# Patient Record
Sex: Female | Born: 1995
Health system: Southern US, Community
[De-identification: ages and names within clinical notes are randomized; demographics above are authoritative.]

## PROBLEM LIST (undated history)

## (undated) ENCOUNTER — Ambulatory Visit: Admission: RE | Source: Ambulatory Visit

## (undated) DIAGNOSIS — G43909 Migraine, unspecified, not intractable, without status migrainosus: Secondary | ICD-10-CM

## (undated) DIAGNOSIS — G932 Benign intracranial hypertension: Secondary | ICD-10-CM

## (undated) DIAGNOSIS — F419 Anxiety disorder, unspecified: Secondary | ICD-10-CM

## (undated) DIAGNOSIS — F32A Depression, unspecified: Secondary | ICD-10-CM

## (undated) DIAGNOSIS — N739 Female pelvic inflammatory disease, unspecified: Secondary | ICD-10-CM

## (undated) DIAGNOSIS — T7840XA Allergy, unspecified, initial encounter: Secondary | ICD-10-CM

## (undated) DIAGNOSIS — M549 Dorsalgia, unspecified: Secondary | ICD-10-CM

## (undated) DIAGNOSIS — F329 Major depressive disorder, single episode, unspecified: Secondary | ICD-10-CM

## (undated) DIAGNOSIS — A609 Anogenital herpesviral infection, unspecified: Secondary | ICD-10-CM

## (undated) DIAGNOSIS — Z975 Presence of (intrauterine) contraceptive device: Secondary | ICD-10-CM

## (undated) DIAGNOSIS — K829 Disease of gallbladder, unspecified: Secondary | ICD-10-CM

## (undated) HISTORY — PX: CHOLECYSTECTOMY: SHX55

## (undated) HISTORY — DX: Anogenital herpesviral infection, unspecified: A60.9

## (undated) HISTORY — DX: Benign intracranial hypertension: G93.2

## (undated) HISTORY — DX: Anxiety disorder, unspecified: F41.9

## (undated) HISTORY — DX: Disease of gallbladder, unspecified: K82.9

## (undated) HISTORY — DX: Major depressive disorder, single episode, unspecified: F32.9

## (undated) HISTORY — DX: Dorsalgia, unspecified: M54.9

## (undated) HISTORY — DX: Migraine, unspecified, not intractable, without status migrainosus: G43.909

## (undated) HISTORY — DX: Allergy, unspecified, initial encounter: T78.40XA

## (undated) HISTORY — DX: Depression, unspecified: F32.A

## (undated) HISTORY — DX: Female pelvic inflammatory disease, unspecified: N73.9

## (undated) HISTORY — DX: Presence of (intrauterine) contraceptive device: Z97.5

## (undated) HISTORY — PX: OTHER SURGICAL HISTORY: SHX169

---
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
## 2014-07-18 ENCOUNTER — Telehealth: Payer: Self-pay | Admitting: Emergency Medicine

## 2014-07-18 ENCOUNTER — Telehealth: Payer: Self-pay

## 2014-07-18 ENCOUNTER — Ambulatory Visit (INDEPENDENT_AMBULATORY_CARE_PROVIDER_SITE_OTHER): Payer: BLUE CROSS/BLUE SHIELD | Admitting: Emergency Medicine

## 2014-07-18 VITALS — BP 112/78 | HR 83 | Temp 98.0°F | Resp 16 | Ht 66.0 in | Wt 252.0 lb

## 2014-07-18 DIAGNOSIS — J029 Acute pharyngitis, unspecified: Secondary | ICD-10-CM

## 2014-07-18 DIAGNOSIS — J988 Other specified respiratory disorders: Secondary | ICD-10-CM

## 2014-07-18 LAB — POCT RAPID STREP A (OFFICE): Rapid Strep A Screen: NEGATIVE

## 2014-07-18 MED ORDER — FLUTICASONE PROPIONATE 50 MCG/ACT NA SUSP: 2.0000 | Freq: Every day | NASAL | Status: DC

## 2014-07-18 NOTE — Progress Notes (Signed)
This chart was scribed for Earl LitesSteve Emri Sample, MD by Luisa DagoPriscilla Tutu, ED Scribe. This patient was seen in room 10 and the patient's care was started at 3:24 PM.  Subjective:    Patient ID: Stacy Mills Mills, female    DOB: 09/01/1995, 19 y.o.   MRN: [id number]  Chief Complaint  Patient presents with  . Sore Throat    x 3 weeks  . Nasal Congestion    x 3 days    HPI Stacy Mills is a 19 y.o. female who presents to the office complaining of sudden onset gradually worsening nasal congestion which started approximately 3 weeks ago. She is also complaining of persistent sore throat with onset 3 weeks ago. Pt states that the pain in her throat is like an itchy discomfort. She also admits to a nonproductive cough which is more prominent at night. Denies any post nasal drip, fever, chills, nausea, emesis, abdominal pain, HA, SOB, CP, leg swelling, ear pain, visual disturbances, or rash as associated symptoms.   Patient Active Problem List   Diagnosis Date Noted  . IUD (intrauterine device) in place 01/10/2013  . PID (acute pelvic inflammatory disease) 11/07/2012  . Hemorrhagic cyst of ovary 11/07/2012  . Severe obesity (BMI >= 40) 08/26/2012   Past Medical History  Diagnosis Date  . Anxiety   . Pelvic inflammatory disease 10/2012  . IUD (intrauterine device) in place     Paraguard placed 2014  . Allergy    Past Surgical History  Procedure Laterality Date  . No surgical history    . No past surgeries     No Known Allergies Prior to Admission medications   Medication Sig Start Date End Date Taking? Authorizing Provider  ibuprofen (ADVIL,MOTRIN) 200 MG tablet Take 600 mg by mouth every 6 (six) hours as needed for pain.    Yes Historical Provider, MD   History   Social History  . Marital Status: Single    Spouse Name: n/a  . Number of Children: 0  . Years of Education: N/A   Occupational History  . student     [employer]   Social History Main Topics  . Smoking  status: Never Smoker   . Smokeless tobacco: Never Used  . Alcohol Use: No  . Drug Use: No  . Sexual Activity:    Partners: Male    Birth Control/ Protection: Condom   Other Topics Concern  . Not on file   Social History Narrative   Graduates from [employer] 10/2012.  Will attend [employer] this fall.     Review of Systems  Constitutional: Negative for fever, chills, appetite change and fatigue.  HENT: Positive for congestion, rhinorrhea and sore throat. Negative for ear discharge, ear pain, sinus pressure, trouble swallowing and voice change.   Eyes: Negative for discharge.  Respiratory: Positive for cough. Negative for shortness of breath, wheezing and stridor.   Cardiovascular: Negative for chest pain.  Gastrointestinal: Negative for nausea, vomiting, abdominal pain and diarrhea.  Genitourinary: Negative.  Negative for frequency and hematuria.  Musculoskeletal: Negative for back pain.  Skin: Negative for rash.  Neurological: Negative for seizures and headaches.   Objective:   Physical Exam  Constitutional: She appears well-developed and well-nourished. No distress.  HENT:  Head: Normocephalic and atraumatic.  Right Ear: External ear normal.  Left Ear: External ear normal.  Mouth/Throat: Oropharynx is clear and moist. No oropharyngeal exudate.  Significant nasal congestion  Eyes: Conjunctivae are normal. Right eye exhibits no discharge.  Left eye exhibits no discharge.  Neck: Neck supple.  Cardiovascular: Normal rate, regular rhythm and normal heart sounds.  Exam reveals no gallop and no friction rub.   No murmur heard. Pulmonary/Chest: Effort normal and breath sounds normal. No respiratory distress.  Abdominal: Soft. She exhibits no distension. There is no tenderness.  Musculoskeletal: She exhibits no edema or tenderness.  Neurological: She is alert.  Skin: Skin is warm and dry.  Psychiatric: She has a normal mood and affect. Her behavior is normal. Thought content  normal.  Nursing note and vitals reviewed.    Filed Vitals:   07/18/14 1507  BP: 112/78  Pulse: 83  Temp: 98 F (36.7 C)  Resp: 16  Height:  (1.676 m)  Weight: 252 lb (114.306 kg)  SpO2: 97%   Results for orders placed or performed in visit on 07/18/14  POCT rapid strep A  Result Value Ref Range   Rapid Strep A Screen Negative Negative    Assessment & Plan:   advised her to take Zyrtec one a day and use the Flonase spray.I personally performed the services described in this documentation, which was scribed in my presence. The recorded information has been reviewed and is accurate.

## 2014-07-18 NOTE — Telephone Encounter (Signed)
I called and spoke with patient. She states she has had some fatigue and questions regarding dehydration. I encouraged her to drink water and juice. I told her we did not do a fatigue evaluation in the office today. I told her we would be happy to do that at any time and check a blood count, thyroid,and blood sugar.

## 2014-07-18 NOTE — Telephone Encounter (Signed)
Dry mouth, not urinating, weak, and feeling tired. Called pt, I let her know the signs of dehydration and advised her to go to the hospital if she feels she is getting worse. Advised to push more fluids. Pt understood.

## 2014-07-18 NOTE — Patient Instructions (Signed)

## 2014-07-18 NOTE — Telephone Encounter (Signed)
Patient seen today and wants to know the signs of dehydration. I asked her if she was feeling worse or if she had any additional symptoms since her visit earlier today and she said no. Cb# [phone]

## 2014-07-21 ENCOUNTER — Other Ambulatory Visit: Payer: Self-pay | Admitting: Emergency Medicine

## 2014-07-21 LAB — CULTURE, GROUP A STREP

## 2014-07-21 MED ORDER — AMOXICILLIN 875 MG PO TABS: 875.0000 mg | ORAL_TABLET | Freq: Two times a day (BID) | ORAL | Status: DC

## 2014-07-22 ENCOUNTER — Telehealth: Payer: Self-pay

## 2014-07-22 NOTE — Telephone Encounter (Signed)
Left message for pt to call back  °

## 2014-07-22 NOTE — Telephone Encounter (Signed)
Patient was prescribed amoxicillin her last office visit and could possible be having an allergic reaction. Patient would like someone to give her a call. [phone]

## 2014-07-22 NOTE — Telephone Encounter (Signed)
Pt CB and reported that she is feeling nauseous after taking the amox. Pt reports she has had no rash/itching, no swelling of lips, face throat or SOB. Pt has not vomited, just felt like she might. Asked pt if she took amox w/food and she did not, she took it both times before eating. I advised her to try taking it after she has eaten a meal (bland, non-spicy food) and to call back tomorrow if it still causes nausea. Pt agreed.

## 2014-10-31 ENCOUNTER — Telehealth: Payer: Self-pay | Admitting: *Deleted

## 2014-10-31 NOTE — Telephone Encounter (Signed)
Pt called with questions because she is late for her period and has a Paragard. She has an apt Mon 6/6. I left message for pt. KW CMA

## 2014-11-03 ENCOUNTER — Ambulatory Visit (INDEPENDENT_AMBULATORY_CARE_PROVIDER_SITE_OTHER): Payer: BLUE CROSS/BLUE SHIELD | Admitting: Gynecology

## 2014-11-03 ENCOUNTER — Encounter: Payer: Self-pay | Admitting: Gynecology

## 2014-11-03 VITALS — BP 120/80 | Ht 65.0 in | Wt 260.0 lb

## 2014-11-03 DIAGNOSIS — N912 Amenorrhea, unspecified: Secondary | ICD-10-CM | POA: Diagnosis not present

## 2014-11-03 DIAGNOSIS — Z8619 Personal history of other infectious and parasitic diseases: Secondary | ICD-10-CM

## 2014-11-03 DIAGNOSIS — Z01419 Encounter for gynecological examination (general) (routine) without abnormal findings: Secondary | ICD-10-CM

## 2014-11-03 LAB — CBC WITH DIFFERENTIAL/PLATELET
Basophils Absolute: 0 10*3/uL (ref 0.0–0.1)
Basophils Relative: 0 % (ref 0–1)
Eosinophils Absolute: 0.2 10*3/uL (ref 0.0–0.7)
Eosinophils Relative: 2 % (ref 0–5)
HCT: 38.1 % (ref 36.0–46.0)
Hemoglobin: 12.5 g/dL (ref 12.0–15.0)
Lymphocytes Relative: 37 % (ref 12–46)
Lymphs Abs: 3.6 10*3/uL (ref 0.7–4.0)
MCH: 27.5 pg (ref 26.0–34.0)
MCHC: 32.8 g/dL (ref 30.0–36.0)
MCV: 83.9 fL (ref 78.0–100.0)
MPV: 9.8 fL (ref 8.6–12.4)
Monocytes Absolute: 0.8 10*3/uL (ref 0.1–1.0)
Monocytes Relative: 8 % (ref 3–12)
NEUTROS PCT: 53 % (ref 43–77)
Neutro Abs: 5.1 10*3/uL (ref 1.7–7.7)
PLATELETS: 317 10*3/uL (ref 150–400)
RBC: 4.54 MIL/uL (ref 3.87–5.11)
RDW: 13.3 % (ref 11.5–15.5)
WBC: 9.7 10*3/uL (ref 4.0–10.5)

## 2014-11-03 LAB — PREGNANCY, URINE: PREG TEST UR: NEGATIVE

## 2014-11-03 MED ORDER — MEDROXYPROGESTERONE ACETATE 10 MG PO TABS: 10.0000 mg | ORAL_TABLET | Freq: Every day | ORAL | Status: DC

## 2014-11-03 NOTE — Progress Notes (Signed)
Stacy Mills 12/15/1995 [id number]   History:    19 y.o.  for annual gyn exam who stated that she has not had a menstrual cycle and 40 days. Patient had a ParaGard T380A IUD placed in 2014. Patient stated in the past she has been treated for Chlamydia infection/PID. Patient with no change in sexual partners. She denied any nausea vomiting but some slight breast tenderness. Patient has completed her HPV vaccine several years ago.  Past medical history,surgical history, family history and social history were all reviewed and documented in the EPIC chart.  Gynecologic History Patient's last menstrual period was 08/27/2014. Contraception: IUD Last Pap: No previous study. Results were: No previous study Last mammogram: Not indicated. Results were: Not indicated  Obstetric History OB History  Gravida Para Term Preterm AB SAB TAB Ectopic Multiple Living  0                  ROS: A ROS was performed and pertinent positives and negatives are included in the history.  GENERAL: No fevers or chills. HEENT: No change in vision, no earache, sore throat or sinus congestion. NECK: No pain or stiffness. CARDIOVASCULAR: No chest pain or pressure. No palpitations. PULMONARY: No shortness of breath, cough or wheeze. GASTROINTESTINAL: No abdominal pain, nausea, vomiting or diarrhea, melena or bright red blood per rectum. GENITOURINARY: No urinary frequency, urgency, hesitancy or dysuria. MUSCULOSKELETAL: No joint or muscle pain, no back pain, no recent trauma. DERMATOLOGIC: No rash, no itching, no lesions. ENDOCRINE: No polyuria, polydipsia, no heat or cold intolerance. No recent change in weight. HEMATOLOGICAL: No anemia or easy bruising or bleeding. NEUROLOGIC: No headache, seizures, numbness, tingling or weakness. PSYCHIATRIC: No depression, no loss of interest in normal activity or change in sleep pattern.     Exam: chaperone present  BP 120/80 mmHg  Ht 5\' 5"  (1.651 m)  Wt 260 lb (117.935 kg)  BMI  43.27 kg/m2  LMP 08/27/2014  Body mass index is 43.27 kg/(m^2).  General appearance : Well developed well nourished female. No acute distress HEENT: Eyes: no retinal hemorrhage or exudates,  Neck supple, trachea midline, no carotid bruits, no thyroidmegaly Lungs: Clear to auscultation, no rhonchi or wheezes, or rib retractions  Heart: Regular rate and rhythm, no murmurs or gallops Breast:Examined in sitting and supine position were symmetrical in appearance, no palpable masses or tenderness,  no skin retraction, no nipple inversion, no nipple discharge, no skin discoloration, no axillary or supraclavicular lymphadenopathy Abdomen: no palpable masses or tenderness, no rebound or guarding Extremities: no edema or skin discoloration or tenderness  Pelvic:  Bartholin, Urethra, Skene Glands: Within normal limits             Vagina: No gross lesions or discharge  Cervix: No gross lesions or discharge, IUD string seen  Uterus  anteverted, normal size, shape and consistency, non-tender and mobile  Adnexa  Without masses or tenderness  Anus and perineum  normal   Rectovaginal  normal sphincter tone without palpated masses or tenderness             Hemoccult not indicated   Urine pregnancy test negative  Assessment/Plan:  19 y.o. female for annual exam who is overweight with a BMI of 43.27 kg meter square this may be a contributing factor for her secondary amenorrhea. Patient denied any visual disturbances or any nipple discharge or any unusual headache. We are going to check a prolactin level along with a TSH because of her secondary menorrhea. Also as  part of her screening blood work CBC and urinalysis will be obtained today. A GC and Chlamydia culture was obtained. Pap smear is not indicated until next year. Patient will be prescribed Provera 10 mg take 1 by mouth daily for 10 days to jump start her menses. We discussed importance of good nutrition and regular exercise.   Ok Edwards MD,  3:25 PM 11/03/2014

## 2014-11-04 LAB — URINALYSIS W MICROSCOPIC + REFLEX CULTURE
BILIRUBIN URINE: NEGATIVE
CASTS: NONE SEEN
CRYSTALS: NONE SEEN
GLUCOSE, UA: NEGATIVE mg/dL
HGB URINE DIPSTICK: NEGATIVE
KETONES UR: NEGATIVE mg/dL
LEUKOCYTES UA: NEGATIVE
NITRITE: NEGATIVE
Protein, ur: NEGATIVE mg/dL
SPECIFIC GRAVITY, URINE: 1.02 (ref 1.005–1.030)
UROBILINOGEN UA: 0.2 mg/dL (ref 0.0–1.0)
pH: 7.5 (ref 5.0–8.0)

## 2014-11-04 LAB — TSH: TSH: 0.747 u[IU]/mL (ref 0.350–4.500)

## 2014-11-04 LAB — PROLACTIN: PROLACTIN: 10.2 ng/mL

## 2014-11-04 LAB — GC/CHLAMYDIA PROBE AMP
CT PROBE, AMP APTIMA: NEGATIVE
GC Probe RNA: NEGATIVE

## 2014-11-05 LAB — URINE CULTURE

## 2014-12-22 ENCOUNTER — Ambulatory Visit (INDEPENDENT_AMBULATORY_CARE_PROVIDER_SITE_OTHER): Payer: BLUE CROSS/BLUE SHIELD | Admitting: Physician Assistant

## 2014-12-22 VITALS — BP 116/70 | HR 74 | Temp 99.4°F | Resp 18 | Ht 65.0 in | Wt 259.0 lb

## 2014-12-22 DIAGNOSIS — G43909 Migraine, unspecified, not intractable, without status migrainosus: Secondary | ICD-10-CM | POA: Insufficient documentation

## 2014-12-22 DIAGNOSIS — G43009 Migraine without aura, not intractable, without status migrainosus: Secondary | ICD-10-CM | POA: Diagnosis not present

## 2014-12-22 MED ORDER — SUMATRIPTAN SUCCINATE 50 MG PO TABS: 50.0000 mg | ORAL_TABLET | ORAL | Status: DC | PRN

## 2014-12-22 NOTE — Patient Instructions (Signed)
Please take the imitrex at the start of a headache.  You can repeat in 2 hours if the headache persists. Do not take more than 4 doses in a 24 hour period. If your headaches persist and the imitrex doesn't work to relieve them, let us know and I will plan to refer you to neurology.   Migraine Headache A migraine headache is an intense, throbbing pain on one or both sides of your head. A migraine can last for 30 minutes to several hours. CAUSES  The exact cause of a migraine headache is not always known. However, a migraine may be caused when nerves in the brain become irritated and release chemicals that cause inflammation. This causes pain. Certain things may also trigger migraines, such as:  Alcohol.  Smoking.  Stress.  Menstruation.  Aged cheeses.  Foods or drinks that contain nitrates, glutamate, aspartame, or tyramine.  Lack of sleep.  Chocolate.  Caffeine.  Hunger.  Physical exertion.  Fatigue.  Medicines used to treat chest pain (nitroglycerine), birth control pills, estrogen, and some blood pressure medicines. SIGNS AND SYMPTOMS  Pain on one or both sides of your head.  Pulsating or throbbing pain.  Severe pain that prevents daily activities.  Pain that is aggravated by any physical activity.  Nausea, vomiting, or both.  Dizziness.  Pain with exposure to bright lights, loud noises, or activity.  General sensitivity to bright lights, loud noises, or smells. Before you get a migraine, you may get warning signs that a migraine is coming (aura). An aura may include:  Seeing flashing lights.  Seeing bright spots, halos, or zigzag lines.  Having tunnel vision or blurred vision.  Having feelings of numbness or tingling.  Having trouble talking.  Having muscle weakness. DIAGNOSIS  A migraine headache is often diagnosed based on:  Symptoms.  Physical exam.  A CT scan or MRI of your head. These imaging tests cannot diagnose migraines, but they can  help rule out other causes of headaches. TREATMENT Medicines may be given for pain and nausea. Medicines can also be given to help prevent recurrent migraines.  HOME CARE INSTRUCTIONS  Only take over-the-counter or prescription medicines for pain or discomfort as directed by your health care provider. The use of long-term narcotics is not recommended.  Lie down in a dark, quiet room when you have a migraine.  Keep a journal to find out what may trigger your migraine headaches. For example, write down:  What you eat and drink.  How much sleep you get.  Any change to your diet or medicines.  Limit alcohol consumption.  Quit smoking if you smoke.  Get 7-9 hours of sleep, or as recommended by your health care provider.  Limit stress.  Keep lights dim if bright lights bother you and make your migraines worse. SEEK IMMEDIATE MEDICAL CARE IF:   Your migraine becomes severe.  You have a fever.  You have a stiff neck.  You have vision loss.  You have muscular weakness or loss of muscle control.  You start losing your balance or have trouble walking.  You feel faint or pass out.  You have severe symptoms that are different from your first symptoms. MAKE SURE YOU:   Understand these instructions.  Will watch your condition.  Will get help right away if you are not doing well or get worse. Document Released: 05/16/2005 Document Revised: 09/30/2013 Document Reviewed: 01/21/2013 [facility] Patient Information 2015 [place], [place]. This information is not intended to replace advice given to  you by your health care provider. Make sure you discuss any questions you have with your health care provider.  

## 2014-12-22 NOTE — Progress Notes (Signed)
   Subjective:    Patient ID: Stacy Mills, female    DOB: Jan 08, 1996, 19 y.o.   MRN: [id number]  Chief Complaint  Patient presents with  . Headache    off and on for years but have gotten worse    Patient Active Problem List   Diagnosis Date Noted  . Migraines 12/22/2014  . IUD (intrauterine device) in place 01/10/2013  . PID (acute pelvic inflammatory disease) 11/07/2012  . Hemorrhagic cyst of ovary 11/07/2012  . Severe obesity (BMI >= 40) 08/26/2012   Medications, allergies, past medical history, surgical history, family history, social history and problem list reviewed and updated.  HPI  71 yof presents with recurrent HAs she believes are migraines.   Not having bad HA today. Intermittent past 1-2 yrs. No triggers or aggravating factors she can think of. Gets one approx every 1-2 wks. Located across top of head. Rated 8/10. Typically last 1/2 day. Relief with lying down and dark room. Tylenol and goodys no longer help.  Assoc phonophobia. No photophobia or aura. Not assoc with menses. No assoc fevers, chills, neck stiffness. Assoc nausea but no emesis.   Sleeps well at night. Wears contacts. No assoc vision changes. Seeing her optometrist next month.   Review of Systems See HPI.     Objective:   Physical Exam  Constitutional: She is oriented to person, place, and time. She appears well-developed and well-nourished.  Non-toxic appearance. She does not have a sickly appearance. She does not appear ill. No distress.  BP 116/70 mmHg  Pulse 74  Temp(Src) 99.4 F (37.4 C) (Oral)  Resp 18  Ht  (1.651 m)  Wt 259 lb (117.482 kg)  BMI 43.10 kg/m2  SpO2 99%  LMP 12/19/2014   Eyes: Conjunctivae and EOM are normal. Pupils are equal, round, and reactive to light.  Neurological: She is alert and oriented to person, place, and time. No cranial nerve deficit.      Assessment & Plan:   Migraine without aura and without status migrainosus, not intractable - Plan: SUMAtriptan  (IMITREX) 50 MG tablet --suspect migraines are cause of recurrent HAs -- imitrex for abortive measures --if HAs worsen or imitrex does not resolve will refer to neurology for further w/u as these are not classic migranies  Donnajean Lopes, PA-C Physician Assistant-Certified [facility]  12/22/2014 3:43 PM

## 2015-11-04 ENCOUNTER — Ambulatory Visit (INDEPENDENT_AMBULATORY_CARE_PROVIDER_SITE_OTHER): Payer: BLUE CROSS/BLUE SHIELD | Admitting: Gynecology

## 2015-11-04 ENCOUNTER — Encounter: Payer: Self-pay | Admitting: Gynecology

## 2015-11-04 VITALS — BP 128/78 | Ht 65.0 in | Wt 260.0 lb

## 2015-11-04 DIAGNOSIS — Z3009 Encounter for other general counseling and advice on contraception: Secondary | ICD-10-CM

## 2015-11-04 DIAGNOSIS — Z124 Encounter for screening for malignant neoplasm of cervix: Secondary | ICD-10-CM

## 2015-11-04 DIAGNOSIS — Z30432 Encounter for removal of intrauterine contraceptive device: Secondary | ICD-10-CM | POA: Diagnosis not present

## 2015-11-04 MED ORDER — ETONOGESTREL-ETHINYL ESTRADIOL 0.12-0.015 MG/24HR VA RING: VAGINAL_RING | VAGINAL | Status: DC

## 2015-11-04 NOTE — Patient Instructions (Signed)

## 2015-11-04 NOTE — Progress Notes (Signed)
   HPI: Patient is a 20 year old was seen in the office in June of last year for her annual exam but was only 20 years of age and had not had a Pap smear. She had a ParaGard T380A IUD placed in 2014. Patient is here to have her IUD removed. She is reporting normal menstrual cycles. She also wanted discuss alternative forms of contraception.   ROS: A ROS was performed and pertinent positives and negatives are included in the history.  GENERAL: No fevers or chills. HEENT: No change in vision, no earache, sore throat or sinus congestion. NECK: No pain or stiffness. CARDIOVASCULAR: No chest pain or pressure. No palpitations. PULMONARY: No shortness of breath, cough or wheeze. GASTROINTESTINAL: No abdominal pain, nausea, vomiting or diarrhea, melena or bright red blood per rectum. GENITOURINARY: No urinary frequency, urgency, hesitancy or dysuria. MUSCULOSKELETAL: No joint or muscle pain, no back pain, no recent trauma. DERMATOLOGIC: No rash, no itching, no lesions. ENDOCRINE: No polyuria, polydipsia, no heat or cold intolerance. No recent change in weight. HEMATOLOGICAL: No anemia or easy bruising or bleeding. NEUROLOGIC: No headache, seizures, numbness, tingling or weakness. PSYCHIATRIC: No depression, no loss of interest in normal activity or change in sleep pattern.   PE: Blood pressure 128/78       weight 260 pounds     BMI 43.27 kg/m Well-developed motors female in no acute distress Abdomen: Soft nontender no rebound or guarding Pelvic: Bartholin urethra Skene was within normal limits Vagina: No lesions or discharge Cervix: IUD string visualized The use of a [place] clamp the IUD string was grasped retrieved shown to the patient discarded Uterus anteverted normal size shape and consistency Adnexa no palpable mass or tenderness Rectal exam: Not done     Assessment Plan: 20 year old requesting removal of her ParaGard T380A IUD. We discussed different alternative forms of contraception to include  oral contraceptive pill, Nexplanon, Depo-Provera injection, NuvaRing. She would like to try the NuvaRing. A sample was provided. Instructions were provided as well as literature information. Of note her Pap smear was also done today.    Greater than 50% of time was spent in counseling and coordinating care of this patient.   Time of consultation: 15   Minutes.

## 2015-11-06 LAB — PAP IG W/ RFLX HPV ASCU

## 2015-11-20 ENCOUNTER — Encounter (HOSPITAL_COMMUNITY): Payer: Self-pay | Admitting: Emergency Medicine

## 2015-11-20 ENCOUNTER — Emergency Department (HOSPITAL_COMMUNITY)
Admission: EM | Admit: 2015-11-20 | Discharge: 2015-11-20 | Disposition: A | Discharge status: Home / Self Care | Payer: BLUE CROSS/BLUE SHIELD | Attending: Emergency Medicine | Admitting: Emergency Medicine

## 2015-11-20 DIAGNOSIS — N6459 Other signs and symptoms in breast: Secondary | ICD-10-CM | POA: Insufficient documentation

## 2015-11-20 NOTE — ED Provider Notes (Signed)
CSN: [id number]     Arrival date & time 11/20/15  1744 History  By signing my name below, I, Placido SouLogan Joldersma, attest that this documentation has been prepared under the direction and in the presence of Everlene FarrierWilliam Telvin Reinders, PA-C. Electronically Signed: Placido SouLogan Joldersma, ED Scribe. 11/20/2015. 7:33 PM.   No chief complaint on file.  The history is provided by the patient. No language interpreter was used.    HPI Comments: [name] OkBrandi Kelsay is a 20 y.o. female who presents to the Emergency Department complaining of a foreign body in her right nipple x 1 month. Pt states that her bilateral nipples were pierced 1 month ago. Beginning 1 week ago she noticed that the barb on the end of the piercing became embedded in her right nipple while she was asleep and is now unable to remove her piercing. She reports associated, mild, yellow discharge from the site but denies any nipple pain. Pt denies fevers, chills, tenderness, redness, swelling or any other associated symptoms at this time.     Past Medical History  Diagnosis Date  . Anxiety   . Pelvic inflammatory disease 10/2012  . IUD (intrauterine device) in place     Paraguard placed 2014  . Allergy    Past Surgical History  Procedure Laterality Date  . No surgical history    . No past surgeries     Family History  Problem Relation Age of Onset  . Emphysema Maternal Grandfather   . Depression Maternal Grandfather   . Anxiety disorder Maternal Grandfather   . Cancer Paternal Grandmother    Social History  Substance Use Topics  . Smoking status: Never Smoker   . Smokeless tobacco: Never Used  . Alcohol Use: No   OB History    Gravida Para Term Preterm AB TAB SAB Ectopic Multiple Living   0    0   0  0     Review of Systems  Constitutional: Negative for fever and chills.  Musculoskeletal: Negative for myalgias.  Skin: Positive for wound. Negative for color change and rash.    Allergies  Review of patient's allergies indicates no known  allergies.  Home Medications   Prior to Admission medications   Medication Sig Start Date End Date Taking? Authorizing Provider  etonogestrel-ethinyl estradiol (NUVARING) 0.12-0.015 MG/24HR vaginal ring Insert vaginally and leave in place for 3 consecutive weeks, then remove for 1 week. 11/04/15   Ok EdwardsJuan H Fernandez, MD  SUMAtriptan (IMITREX) 50 MG tablet Take 1 tablet (50 mg total) by mouth every 2 (two) hours as needed for migraine (Do not exceed 4 doses in one day.). May repeat in 2 hours if headache persists or recurs. 12/22/14   Todd McVeigh, PA   BP 123/95 mmHg  Pulse 87  Temp(Src) 98 F (36.7 C) (Oral)  Resp 16  SpO2 100%  LMP 10/26/2015    Physical Exam  Constitutional: She appears well-developed and well-nourished. No distress.  Nontoxic appearing.  HENT:  Head: Normocephalic and atraumatic.  Eyes: Right eye exhibits no discharge. Left eye exhibits no discharge.  Neck: Neck supple.  Cardiovascular: Normal rate, regular rhythm, normal heart sounds and intact distal pulses.   Pulmonary/Chest: Effort normal. No respiratory distress.  The lateral aspect of the patients nipple piercing is beneath the surface of her nipple. There is no lateral piercing hole. The medial aspect of the piercing is exposed and mobile. Nipple is nontender to palpation. No evidence of nipple piercing hole to lateral aspect. No discharge from her nipple.  No overlying skin changes. No cellulitis.  Genitourinary:  Chaperone present  Lymphadenopathy:    She has no cervical adenopathy.  Neurological: She is alert. Coordination normal.  Skin: Skin is warm and dry. No rash noted. She is not diaphoretic. No erythema. No pallor.  Psychiatric: She has a normal mood and affect. Her behavior is normal.  Nursing note and vitals reviewed.   ED Course  Procedures  DIAGNOSTIC STUDIES: Oxygen Saturation is 100% on RA, normal by my interpretation.    COORDINATION OF CARE: 7:25 PM Discussed next steps with pt. Pt  verbalized understanding and is agreeable with the plan.   7:31 PM Discussed pt with the attending physician who recommends pt be referred to general surgery for removal of nipple piercing at a later date. Pt advised and is agreeable with the plan.   Labs Review Labs Reviewed - No data to display  Imaging Review No results found.   EKG Interpretation None      Filed Vitals:   11/20/15 1814  BP: 123/95  Pulse: 87  Temp: 98 F (36.7 C)  TempSrc: Oral  Resp: 16  SpO2: 100%     MDM   Meds given in ED:  Medications - No data to display  New Prescriptions   No medications on file    Final diagnoses:  Nipple problem   This is a 20 y.o. female who presents to the Emergency Department complaining of a foreign body in her right nipple x 1 month. Pt states that her bilateral nipples were pierced 1 month ago. Beginning 1 week ago she noticed that the barb on the end of the piercing became embedded in her right nipple while she was asleep and is now unable to remove her piercing. She reports associated, mild, yellow discharge from the site but denies any nipple pain.  On exam the patient is afebrile nontoxic appearing. The lateral aspect of her nipple piercing is absent and beneath the service of her skin. Medial aspect is exposed. No lateral aspect nipple piercing hole is present. No discharge from the nipple piercing. No overlying skin changes. No cellulitis. No evidence of infection. Unable to remove piercing with gentle pressure. After discussion with my attending, Dr. Effie ShyWentz, will send the patient to general surgery for surgical removal of her nipple piercing. I discussed her transpacific return precautions. I discussed signs and symptoms of nipple infection, and I encouraged her to return to the emergency department with any signs of infection. I advised the patient to follow-up with their primary care provider this week. I advised the patient to return to the emergency department with  new or worsening symptoms or new concerns. The patient verbalized understanding and agreement with plan.    This patient was discussed with Dr. Effie ShyWentz agrees with assessment and plan.  I personally performed the services described in this documentation, which was scribed in my presence. The recorded information has been reviewed and is accurate.      Everlene FarrierWilliam Aizlyn Schifano, PA-C 11/20/15 1942  Mancel BaleElliott Wentz, MD 11/21/15 [phone]

## 2015-11-20 NOTE — ED Notes (Signed)
1 month prior had her nipples pierced. Pt states while the nipple was healing, the ring was still intact while the nipple scarring grew around/over top of it. The patient is no longer able to see or access the nipple ring to remove it.

## 2015-11-20 NOTE — Discharge Instructions (Signed)
Please call and make an appointment for follow-up with general surgery for removal of your nipple piercing. Please return to the emergency department with any signs or symptoms of infection or new or worsening symptoms or new concerns.  Breast Self-Awareness Practicing breast self-awareness may pick up problems early, prevent significant medical complications, and possibly save your life. By practicing breast self-awareness, you can become familiar with how your breasts look and feel and if your breasts are changing. This allows you to notice changes early. It can also offer you some reassurance that your breast health is good. One way to learn what is normal for your breasts and whether your breasts are changing is to do a breast self-exam. If you find a lump or something that was not present in the past, it is best to contact your caregiver right away. Other findings that should be evaluated by your caregiver include nipple discharge, especially if it is bloody; skin changes or reddening; areas where the skin seems to be pulled in (retracted); or new lumps and bumps. Breast pain is seldom associated with cancer (malignancy), but should also be evaluated by a caregiver. HOW TO PERFORM A BREAST SELF-EXAM The best time to examine your breasts is 5-7 days after your menstrual period is over. During menstruation, the breasts are lumpier, and it may be more difficult to pick up changes. If you do not menstruate, have reached menopause, or had your uterus removed (hysterectomy), you should examine your breasts at regular intervals, such as monthly. If you are breastfeeding, examine your breasts after a feeding or after using a breast pump. Breast implants do not decrease the risk for lumps or tumors, so continue to perform breast self-exams as recommended. Talk to your caregiver about how to determine the difference between the implant and breast tissue. Also, talk about the amount of pressure you should use during  the exam. Over time, you will become more familiar with the variations of your breasts and more comfortable with the exam. A breast self-exam requires you to remove all your clothes above the waist. 1. Look at your breasts and nipples. Stand in front of a mirror in a room with good lighting. With your hands on your hips, push your hands firmly downward. Look for a difference in shape, contour, and size from one breast to the other (asymmetry). Asymmetry includes puckers, dips, or bumps. Also, look for skin changes, such as reddened or scaly areas on the breasts. Look for nipple changes, such as discharge, dimpling, repositioning, or redness. 2. Carefully feel your breasts. This is best done either in the shower or tub while using soapy water or when flat on your back. Place the arm (on the side of the breast you are examining) above your head. Use the pads (not the fingertips) of your three middle fingers on your opposite hand to feel your breasts. Start in the underarm area and use  inch (2 cm) overlapping circles to feel your breast. Use 3 different levels of pressure (light, medium, and firm pressure) at each circle before moving to the next circle. The light pressure is needed to feel the tissue closest to the skin. The medium pressure will help to feel breast tissue a little deeper, while the firm pressure is needed to feel the tissue close to the ribs. Continue the overlapping circles, moving downward over the breast until you feel your ribs below your breast. Then, move one finger-width towards the center of the body. Continue to use the  inch (2 cm) overlapping circles to feel your breast as you move slowly up toward the collar bone (clavicle) near the base of the neck. Continue the up and down exam using all 3 pressures until you reach the middle of the chest. Do this with each breast, carefully feeling for lumps or changes. 3.  Keep a written record with breast changes or normal findings for each  breast. By writing this information down, you do not need to depend only on memory for size, tenderness, or location. Write down where you are in your menstrual cycle, if you are still menstruating. Breast tissue can have some lumps or thick tissue. However, see your caregiver if you find anything that concerns you.  SEEK MEDICAL CARE IF:  You see a change in shape, contour, or size of your breasts or nipples.   You see skin changes, such as reddened or scaly areas on the breasts or nipples.   You have an unusual discharge from your nipples.   You feel a new lump or unusually thick areas.    This information is not intended to replace advice given to you by your health care provider. Make sure you discuss any questions you have with your health care provider.   Document Released: 05/16/2005 Document Revised: 05/02/2012 Document Reviewed: 08/31/2011 Elsevier Interactive Patient Education [employer] Inc.

## 2016-04-04 ENCOUNTER — Encounter (HOSPITAL_COMMUNITY): Payer: Self-pay | Admitting: Emergency Medicine

## 2016-04-04 ENCOUNTER — Emergency Department (HOSPITAL_COMMUNITY)
Admission: EM | Admit: 2016-04-04 | Discharge: 2016-04-04 | Disposition: A | Discharge status: Home / Self Care | Payer: BLUE CROSS/BLUE SHIELD | Attending: Dermatology | Admitting: Dermatology

## 2016-04-04 DIAGNOSIS — I10 Essential (primary) hypertension: Secondary | ICD-10-CM | POA: Insufficient documentation

## 2016-04-04 DIAGNOSIS — F17210 Nicotine dependence, cigarettes, uncomplicated: Secondary | ICD-10-CM | POA: Insufficient documentation

## 2016-04-04 DIAGNOSIS — Z5321 Procedure and treatment not carried out due to patient leaving prior to being seen by health care provider: Secondary | ICD-10-CM | POA: Insufficient documentation

## 2016-04-04 NOTE — ED Notes (Signed)
Pt called from triage again, no answer

## 2016-04-04 NOTE — ED Triage Notes (Signed)
Pt reports High BP last night 143/98. Sts has family Hx HTN. Would like to have it checked. Reports headache and sts seeing stars left eye. Denies dizziness.

## 2016-04-05 ENCOUNTER — Ambulatory Visit (HOSPITAL_COMMUNITY)
Admission: EM | Admit: 2016-04-05 | Discharge: 2016-04-05 | Disposition: A | Discharge status: Home / Self Care | Payer: BLUE CROSS/BLUE SHIELD | Attending: Family Medicine | Admitting: Family Medicine

## 2016-04-05 ENCOUNTER — Encounter (HOSPITAL_COMMUNITY): Payer: Self-pay | Admitting: Emergency Medicine

## 2016-04-05 DIAGNOSIS — H43392 Other vitreous opacities, left eye: Secondary | ICD-10-CM | POA: Diagnosis not present

## 2016-04-05 NOTE — ED Triage Notes (Signed)
[facility] with a complaint of seeing a "dark Spot" with her left eye that started 2 days ago. The patient reported a family hx HTN. [facility] last night but did not wait to be seen by a provider.

## 2016-04-05 NOTE — Discharge Instructions (Signed)
See eye doctor for further check.

## 2016-04-05 NOTE — ED Provider Notes (Signed)
[facility]    CSN: [id number] Arrival date & time: 04/05/16  1757     History   Chief Complaint Chief Complaint  Patient presents with  . Eye Problem    HPI Stacy Mills is a 20 y.o. female.   The history is provided by the patient and a relative.  Eye Problem  Location:  Left eye Quality: floater sensation in visual field, no pain, no visual loss. Severity:  Mild Onset quality:  Sudden Duration:  2 days Progression:  Unchanged Chronicity:  New Context: not direct trauma, not foreign body and not scratch   Associated symptoms: no blurred vision, no crusting, no decreased vision, no discharge, no double vision, no headaches, no itching, no photophobia and no redness     Past Medical History:  Diagnosis Date  . Allergy   . Anxiety   . IUD (intrauterine device) in place    Paraguard placed 2014  . Pelvic inflammatory disease 10/2012    Patient Active Problem List   Diagnosis Date Noted  . Migraines 12/22/2014  . IUD (intrauterine device) in place 01/10/2013  . PID (acute pelvic inflammatory disease) 11/07/2012  . Hemorrhagic cyst of ovary 11/07/2012  . Severe obesity (BMI >= 40) (HCC) 08/26/2012    Past Surgical History:  Procedure Laterality Date  . NO PAST SURGERIES    . no surgical history      OB History    Gravida Para Term Preterm AB Living   0       0 0   SAB TAB Ectopic Multiple Live Births       0           Home Medications    Prior to Admission medications   Medication Sig Start Date End Date Taking? Authorizing Provider  etonogestrel-ethinyl estradiol (NUVARING) 0.12-0.015 MG/24HR vaginal ring Insert vaginally and leave in place for 3 consecutive weeks, then remove for 1 week. Patient not taking: Reported on 04/05/2016 11/04/15   Ok EdwardsJuan H Fernandez, MD  SUMAtriptan (IMITREX) 50 MG tablet Take 1 tablet (50 mg total) by mouth every 2 (two) hours as needed for migraine (Do not exceed 4 doses in one day.). May repeat in 2 hours if  headache persists or recurs. Patient not taking: Reported on 04/05/2016 12/22/14   Raelyn Ensignodd McVeigh, PA    Family History Family History  Problem Relation Age of Onset  . Emphysema Maternal Grandfather   . Depression Maternal Grandfather   . Anxiety disorder Maternal Grandfather   . Cancer Paternal Grandmother     Social History Social History  Substance Use Topics  . Smoking status: Never Smoker  . Smokeless tobacco: Never Used  . Alcohol use No     Allergies   Patient has no known allergies.   Review of Systems Review of Systems  Constitutional: Negative.   HENT: Negative.   Eyes: Negative for blurred vision, double vision, photophobia, pain, discharge, redness, itching and visual disturbance.  Respiratory: Negative.   Neurological: Negative for headaches.  All other systems reviewed and are negative.    Physical Exam Triage Vital Signs ED Triage Vitals  Enc Vitals Group     BP --      Pulse Rate 04/05/16 1818 83     Resp 04/05/16 1818 18     Temp 04/05/16 1818 98.1 F (36.7 C)     Temp Source 04/05/16 1818 Oral     SpO2 04/05/16 1818 100 %     Weight --  Height --      Head Circumference --      Peak Flow --      Pain Score 04/05/16 1823 6     Pain Loc --      Pain Edu? --      Excl. in GC? --    No data found.   Updated Vital Signs Pulse 83   Temp 98.1 F (36.7 C) (Oral)   Resp 18   LMP 03/04/2016   SpO2 100%   Visual Acuity Right Eye Distance: 20/20 Left Eye Distance: Unable to See Eye Chart Bilateral Distance: 20/25  Right Eye Near:   Left Eye Near:    Bilateral Near:     Physical Exam  Constitutional: She appears well-developed and well-nourished.  Eyes: Conjunctivae and EOM are normal. Pupils are equal, round, and reactive to light. Right eye exhibits no discharge. Left eye exhibits no discharge.  Neck: Normal range of motion. Neck supple.  Lymphadenopathy:    She has no cervical adenopathy.  Skin: Skin is warm and dry.    Nursing note and vitals reviewed.    UC Treatments / Results  Labs (all labs ordered are listed, but only abnormal results are displayed) Labs Reviewed - No data to display  EKG  EKG Interpretation None       Radiology No results found.  Procedures Procedures (including critical care time)  Medications Ordered in UC Medications - No data to display   Initial Impression / Assessment and Plan / UC Course  I have reviewed the triage vital signs and the nursing notes.  Pertinent labs & imaging results that were available during my care of the patient were reviewed by me and considered in my medical decision making (see chart for details).  Clinical Course       Final Clinical Impressions(s) / UC Diagnoses   Final diagnoses:  None    New Prescriptions New Prescriptions   No medications on file     Linna HoffJames D Jacob Cicero, MD 04/05/16 1914

## 2016-04-07 ENCOUNTER — Emergency Department (HOSPITAL_COMMUNITY)
Admission: EM | Admit: 2016-04-07 | Discharge: 2016-04-07 | Disposition: A | Discharge status: Home / Self Care | Payer: BLUE CROSS/BLUE SHIELD | Attending: Emergency Medicine | Admitting: Emergency Medicine

## 2016-04-07 ENCOUNTER — Encounter (HOSPITAL_COMMUNITY): Payer: Self-pay | Admitting: Emergency Medicine

## 2016-04-07 ENCOUNTER — Emergency Department (HOSPITAL_COMMUNITY): Payer: BLUE CROSS/BLUE SHIELD

## 2016-04-07 DIAGNOSIS — F17210 Nicotine dependence, cigarettes, uncomplicated: Secondary | ICD-10-CM | POA: Insufficient documentation

## 2016-04-07 DIAGNOSIS — H53132 Sudden visual loss, left eye: Secondary | ICD-10-CM

## 2016-04-07 DIAGNOSIS — R51 Headache: Secondary | ICD-10-CM | POA: Diagnosis present

## 2016-04-07 DIAGNOSIS — H5462 Unqualified visual loss, left eye, normal vision right eye: Secondary | ICD-10-CM | POA: Insufficient documentation

## 2016-04-07 DIAGNOSIS — G932 Benign intracranial hypertension: Secondary | ICD-10-CM | POA: Diagnosis not present

## 2016-04-07 LAB — COMPREHENSIVE METABOLIC PANEL
ALBUMIN: 3.7 g/dL (ref 3.5–5.0)
ALT: 24 U/L (ref 14–54)
ANION GAP: 9 (ref 5–15)
AST: 24 U/L (ref 15–41)
Alkaline Phosphatase: 48 U/L (ref 38–126)
BUN: 10 mg/dL (ref 6–20)
CHLORIDE: 107 mmol/L (ref 101–111)
CO2: 22 mmol/L (ref 22–32)
Calcium: 9.4 mg/dL (ref 8.9–10.3)
Creatinine, Ser: 0.87 mg/dL (ref 0.44–1.00)
GFR calc Af Amer: >60 mL/min (ref >60)
GFR calc non Af Amer: >60 mL/min (ref >60)
GLUCOSE: 81 mg/dL (ref 65–99)
POTASSIUM: 3.9 mmol/L (ref 3.5–5.1)
SODIUM: 138 mmol/L (ref 135–145)
TOTAL PROTEIN: 7.2 g/dL (ref 6.5–8.1)
Total Bilirubin: 0.4 mg/dL (ref 0.3–1.2)

## 2016-04-07 LAB — CBC WITH DIFFERENTIAL/PLATELET
BASOS ABS: 0 10*3/uL (ref 0.0–0.1)
BASOS PCT: 0 %
EOS ABS: 0.2 10*3/uL (ref 0.0–0.7)
Eosinophils Relative: 2 %
HCT: 39.4 % (ref 36.0–46.0)
Hemoglobin: 12.7 g/dL (ref 12.0–15.0)
Lymphocytes Relative: 47 %
Lymphs Abs: 3.8 10*3/uL (ref 0.7–4.0)
MCH: 27.4 pg (ref 26.0–34.0)
MCHC: 32.2 g/dL (ref 30.0–36.0)
MCV: 84.9 fL (ref 78.0–100.0)
MONO ABS: 0.6 10*3/uL (ref 0.1–1.0)
MONOS PCT: 7 %
NEUTROS ABS: 3.6 10*3/uL (ref 1.7–7.7)
Neutrophils Relative %: 44 %
PLATELETS: 292 10*3/uL (ref 150–400)
RBC: 4.64 MIL/uL (ref 3.87–5.11)
RDW: 12.8 % (ref 11.5–15.5)
WBC: 8.1 10*3/uL (ref 4.0–10.5)

## 2016-04-07 LAB — CSF CELL COUNT WITH DIFFERENTIAL
RBC COUNT CSF: 0 /mm3
RBC COUNT CSF: 1 /mm3 — AB
TUBE #: 4
Tube #: 1
WBC CSF: 5 /mm3 (ref 0–5)
WBC CSF: 7 /mm3 — AB (ref 0–5)

## 2016-04-07 LAB — GLUCOSE, CSF: GLUCOSE CSF: 42 mg/dL (ref 40–70)

## 2016-04-07 LAB — PROTEIN, CSF: TOTAL PROTEIN, CSF: 28 mg/dL (ref 15–45)

## 2016-04-07 LAB — SEDIMENTATION RATE: Sed Rate: 30 mm/hr — ABNORMAL HIGH (ref 0–22)

## 2016-04-07 MED ORDER — DIPHENHYDRAMINE HCL 50 MG/ML IJ SOLN
50.0000 mg | Freq: Once | INTRAMUSCULAR | Status: AC
  Administered 2016-04-07: 50 mg via INTRAMUSCULAR

## 2016-04-07 MED ORDER — ACETAZOLAMIDE ER 500 MG PO CP12: 500.0000 mg | ORAL_CAPSULE | Freq: Two times a day (BID) | ORAL | 0 refills | Status: DC

## 2016-04-07 MED ORDER — GADOBENATE DIMEGLUMINE 529 MG/ML IV SOLN
20.0000 mL | Freq: Once | INTRAVENOUS | Status: AC
  Administered 2016-04-07: 20 mL via INTRAVENOUS

## 2016-04-07 MED ORDER — DIPHENHYDRAMINE HCL 50 MG/ML IJ SOLN
INTRAMUSCULAR | Status: AC
  Filled 2016-04-07: qty 1

## 2016-04-07 MED ORDER — ACETAZOLAMIDE ER 500 MG PO CP12
500.0000 mg | ORAL_CAPSULE | Freq: Once | ORAL | Status: AC
  Administered 2016-04-07: 500 mg via ORAL
  Filled 2016-04-07: qty 1

## 2016-04-07 MED ORDER — ACETAZOLAMIDE 250 MG PO TABS: 250.0000 mg | ORAL_TABLET | Freq: Two times a day (BID) | ORAL | 0 refills | Status: DC | PRN

## 2016-04-07 NOTE — Consult Note (Signed)
Neurology Consultation Reason for Consult: visual changes Referring Physician: ED provider  CC: visual changes  History is obtained from:patient, chart.  HPI: Stacy Mills is a 20 y.o. female healthy female with hx of migraine and anxiety presents from the opthalmology office.   Developed sudden onset dark spot of her vision in the left eye 4 days ago only when she closes her right eye. If both eyes are open, she does not see that spot. It's a fixed spot centrally with preserved vision surrounding the spot. Has occasional flashing of lights and several floaters. Went to urgent care 2 days ago for this and was seen in opthamology office by Dr. Kathlen Mody today. Had dilated eye exam there. She has normal intraocular pressure on both eyes. She was found to have papilledema left greater than right. She was then sent to the ER with recommendation for neuro consult, MRI, and LP for further evaluation.   No similar symptom in the past. No numbness, weakness, tingling, hearing deficit, significant headache (only some headache when she tries to focus her vision hard). No family hx of neuro diseases. No fever, no recent travel or sick contacts.     Review of Systems  Constitutional: Negative for chills, fever, malaise/fatigue and weight loss.  HENT: Negative for congestion, ear pain, hearing loss and tinnitus.   Eyes: Negative for photophobia, pain, discharge and redness.       Dark spot  Cardiovascular: Negative for chest pain, palpitations, orthopnea and leg swelling.  Gastrointestinal: Negative for heartburn, nausea and vomiting.  Skin: Negative for rash.  Neurological: Negative for dizziness, tingling, sensory change, speech change, focal weakness, seizures, loss of consciousness, weakness and headaches.     Past Medical History:  Diagnosis Date  . Allergy   . Anxiety   . IUD (intrauterine device) in place    Paraguard placed 2014  . Pelvic inflammatory disease 10/2012    Family History   Problem Relation Age of Onset  . Emphysema Maternal Grandfather   . Depression Maternal Grandfather   . Anxiety disorder Maternal Grandfather   . Cancer Paternal Grandmother     Social History:  reports that she has never smoked. She has never used smokeless tobacco. She reports that she does not drink alcohol or use drugs.   Exam: Current vital signs: Ht '5\' 6"'$  (1.676 m)   Wt 270 lb (122.5 kg)   LMP 03/03/2016 (Exact Date)   BMI 43.58 kg/m  Vital signs in last 24 hours: Weight:  [270 lb (122.5 kg)] 270 lb (122.5 kg) (11/09 1046)   Physical Exam  Constitutional: Appears well-developed and well-nourished.  Psych: Affect appropriate to situation Eyes: No scleral injection HENT: No OP obstrucion Head: Normocephalic.  Cardiovascular: Normal rate and regular rhythm.  Respiratory: Effort normal and breath sounds normal to anterior ascultation GI: Soft.  No distension. There is no tenderness.  Skin: WDI  Neuro: Mental Status: Patient is awake, alert, oriented to person, place, month, year, and situation. Patient is able to give a clear and coherent history. No signs of aphasia or neglect Cranial Nerves: II: Visual Fields are full. Pupils are dilated, round, and reactive to light.  III,IV, VI: EOMI without ptosis or diploplia. Has central field vision loss on left eye.  V: Facial sensation decreased on the left side.  VII: Facial movement is symmetric.  VIII: hearing is intact to voice X: Uvula elevates symmetrically XI: Shoulder shrug is symmetric. XII: tongue is midline without atrophy or fasciculations.  Motor: Tone is  normal. Bulk is normal. 5/5 strength was present in all four extremities.  Sensory: Sensation is slightly decreased on left leg.  Deep Tendon Reflexes: 2+ and symmetric in the biceps and patellae.  Plantars: Toes are downgoing bilaterally.  Cerebellar: FNF and HKS are intact bilaterally      I have reviewed labs in epic and the results pertinent  to this consultation are: Bmet, cbc, esr 30.   I have reviewed the images obtained:  MRI brain and orbits were normal.  Impression:   20 yo female here with 4 days of left sided central vision loss that's not progressive, with papilledema L>R and normal MRI. DDX pseudotumor cerebri vs optic neuritis.  Recommendations: 1) will get LP with opening pressure under fluoroscopy and evaluate further.    Dellia Nims, M.D., PGY-3

## 2016-04-07 NOTE — ED Triage Notes (Signed)
Pt from opthalmologists office with c/o seeing a dark spot in her vision of left eye starting 4 days ago with sudden onset.  Reports they told her she had swelling in her eye and sent her here for an MRI.  Pt in NAD, A&O.

## 2016-04-07 NOTE — ED Notes (Signed)
Papers reviewed with patient and family. They verbalize understanding and intent to follow up

## 2016-04-07 NOTE — ED Provider Notes (Signed)
[facility]   CSN: [id number] Arrival date & time: 04/07/16  [id number]     History   Chief Complaint Chief Complaint  Patient presents with  . Eye Problem    HPI Stacy Mills is a 20 y.o. female.  HPI   20 year old obese female with history of migraine and anxiety from ophthalmologist office for further evaluation of visual changes.  Patient developed sudden onset dark spot of vision in the left eye approximate 4 days ago. She described as a fixed blurry spot centrally in her L eye.  sts she can see around the spot peripherally but cannot see through the spot.  Report occasional flashes of lights, as well as several floaters that look like long curly strings.  C/o headache only when she has to focus her vision.  She wears contacts.  Denies fever, diplopia, URI sxs, neck pain, cp, sob, focal numbness or weakness.  Denies lightheadedness or dizziness.  NO recent trauma.  Pt initially was seen at urgent care 2 days ago for this complaint and was referred to ophthalmologist. She saw the ophthalmologist, Dr. Alben SpittleWeaver today. She has a high dilated for examination. She has normal intraocular pressure on both eyes. She was found to have papilledema left greater than right. She was then sent to the ER with recommendation for neuro consult, MRI, and LP for further evaluation.   Past Medical History:  Diagnosis Date  . Allergy   . Anxiety   . IUD (intrauterine device) in place    Paraguard placed 2014  . Pelvic inflammatory disease 10/2012    Patient Active Problem List   Diagnosis Date Noted  . Migraines 12/22/2014  . IUD (intrauterine device) in place 01/10/2013  . PID (acute pelvic inflammatory disease) 11/07/2012  . Hemorrhagic cyst of ovary 11/07/2012  . Severe obesity (BMI >= 40) (HCC) 08/26/2012    Past Surgical History:  Procedure Laterality Date  . NO PAST SURGERIES    . no surgical history      OB History    Gravida Para Term Preterm AB Living   0        0 0   SAB TAB Ectopic Multiple Live Births       0           Home Medications    Prior to Admission medications   Medication Sig Start Date End Date Taking? Authorizing Provider  etonogestrel-ethinyl estradiol (NUVARING) 0.12-0.015 MG/24HR vaginal ring Insert vaginally and leave in place for 3 consecutive weeks, then remove for 1 week. Patient not taking: Reported on 04/05/2016 11/04/15   Ok EdwardsJuan H Fernandez, MD  SUMAtriptan (IMITREX) 50 MG tablet Take 1 tablet (50 mg total) by mouth every 2 (two) hours as needed for migraine (Do not exceed 4 doses in one day.). May repeat in 2 hours if headache persists or recurs. Patient not taking: Reported on 04/05/2016 12/22/14   Raelyn Ensignodd McVeigh, PA    Family History Family History  Problem Relation Age of Onset  . Emphysema Maternal Grandfather   . Depression Maternal Grandfather   . Anxiety disorder Maternal Grandfather   . Cancer Paternal Grandmother     Social History Social History  Substance Use Topics  . Smoking status: Never Smoker  . Smokeless tobacco: Never Used  . Alcohol use No     Allergies   Patient has no known allergies.   Review of Systems Review of Systems  All other systems reviewed and are negative.    Physical Exam  Updated Vital Signs LMP 03/03/2016 (Exact Date)   Physical Exam  Constitutional: She is oriented to person, place, and time. She appears well-developed and well-nourished. No distress.  Obese female, sitting in the bed in no acute discomfort  HENT:  Head: Atraumatic.  Right Ear: External ear normal.  Left Ear: External ear normal.  Mouth/Throat: Oropharynx is clear and moist.  Eyes: Conjunctivae and EOM are normal. Right eye exhibits no discharge. Left eye exhibits no discharge.  Pupils are dilated  Visual Acuity R Distance: 20/400 L Distance: couldnt see anything    Neck: Normal range of motion. Neck supple.  Cardiovascular: Normal rate and regular rhythm.   Pulmonary/Chest: Effort normal  and breath sounds normal.  Neurological: She is alert and oriented to person, place, and time. She has normal strength. No cranial nerve deficit or sensory deficit. She displays a negative Romberg sign. Coordination and gait normal. GCS eye subscore is 4. GCS verbal subscore is 5. GCS motor subscore is 6.  Skin: No rash noted.  Psychiatric: She has a normal mood and affect.  Nursing note and vitals reviewed.    ED Treatments / Results  Labs (all labs ordered are listed, but only abnormal results are displayed) Labs Reviewed  SEDIMENTATION RATE - Abnormal; Notable for the following:       Result Value   Sed Rate 30 (*)    All other components within normal limits  CSF CULTURE  CBC WITH DIFFERENTIAL/PLATELET  COMPREHENSIVE METABOLIC PANEL  GLUCOSE, CSF  PROTEIN, CSF  HIV ANTIBODY (ROUTINE TESTING)  CSF CELL COUNT WITH DIFFERENTIAL  CSF CELL COUNT WITH DIFFERENTIAL    EKG  EKG Interpretation None       Radiology Mr Laqueta Jean And Wo Contrast  Result Date: 04/07/2016 CLINICAL DATA:  Left visual disturbance beginning 4 days ago, sudden onset. Abnormal funduscopic exam. Patient felt slightly itchy and developed a mild cough after contrast administration. No hives could be visualized. She was given 50 mg of Benadryl intravenously as a precaution before the examination was finished. I would interpret this as a mild allergy to MultiHance and consider premedication with Benadryl in the future. EXAM: MRI HEAD AND ORBITS WITHOUT AND WITH CONTRAST TECHNIQUE: Multiplanar, multiecho pulse sequences of the brain and surrounding structures were obtained without and with intravenous contrast. Multiplanar, multiecho pulse sequences of the orbits and surrounding structures were obtained including fat saturation techniques, before and after intravenous contrast administration. CONTRAST:  20mL MULTIHANCE GADOBENATE DIMEGLUMINE 529 MG/ML IV SOLN COMPARISON:  None. FINDINGS: MRI HEAD FINDINGS Brain: The  brain has normal appearance without evidence of malformation, atrophy, old or acute small or large vessel infarction, hemorrhage, hydrocephalus or extra-axial collection. No pituitary abnormality. After contrast administration, no abnormal enhancement occurs. Vascular: Major vessels at the base of the brain show flow. Skull and upper cervical spine: Normal Sinuses/Orbits: Clear/ normal. Other: None significant. MRI ORBITS FINDINGS Orbits: Both globes appear normal. Both optic nerves and optic nerve sheaths are normal. Intraorbital fat is normal. Extra-ocular muscles are normal. Lacrimal glands are normal. No abnormal enhancement. Vascular structures are normal. Visualized sinuses: Clear Soft tissues: No other finding Limited intracranial: Negative IMPRESSION: Normal MRI of the brain and orbits. No finding to explain the clinical presentation. No sign of increased intracranial pressure by imaging. No focal brain or orbital finding. As discussed above, the patient should be considered to have a mild allergy to MultiHance. I would suggest premedication with Benadryl should she need MultiHance gadolinium contrast in the future. Electronically  Signed   By: Paulina FusiMark  Shogry M.D.   On: 04/07/2016 15:08   Mr Rockwell GermanyOrbits W MVWo Contrast  Result Date: 04/07/2016 CLINICAL DATA:  Left visual disturbance beginning 4 days ago, sudden onset. Abnormal funduscopic exam. Patient felt slightly itchy and developed a mild cough after contrast administration. No hives could be visualized. She was given 50 mg of Benadryl intravenously as a precaution before the examination was finished. I would interpret this as a mild allergy to MultiHance and consider premedication with Benadryl in the future. EXAM: MRI HEAD AND ORBITS WITHOUT AND WITH CONTRAST TECHNIQUE: Multiplanar, multiecho pulse sequences of the brain and surrounding structures were obtained without and with intravenous contrast. Multiplanar, multiecho pulse sequences of the orbits and  surrounding structures were obtained including fat saturation techniques, before and after intravenous contrast administration. CONTRAST:  20mL MULTIHANCE GADOBENATE DIMEGLUMINE 529 MG/ML IV SOLN COMPARISON:  None. FINDINGS: MRI HEAD FINDINGS Brain: The brain has normal appearance without evidence of malformation, atrophy, old or acute small or large vessel infarction, hemorrhage, hydrocephalus or extra-axial collection. No pituitary abnormality. After contrast administration, no abnormal enhancement occurs. Vascular: Major vessels at the base of the brain show flow. Skull and upper cervical spine: Normal Sinuses/Orbits: Clear/ normal. Other: None significant. MRI ORBITS FINDINGS Orbits: Both globes appear normal. Both optic nerves and optic nerve sheaths are normal. Intraorbital fat is normal. Extra-ocular muscles are normal. Lacrimal glands are normal. No abnormal enhancement. Vascular structures are normal. Visualized sinuses: Clear Soft tissues: No other finding Limited intracranial: Negative IMPRESSION: Normal MRI of the brain and orbits. No finding to explain the clinical presentation. No sign of increased intracranial pressure by imaging. No focal brain or orbital finding. As discussed above, the patient should be considered to have a mild allergy to MultiHance. I would suggest premedication with Benadryl should she need MultiHance gadolinium contrast in the future. Electronically Signed   By: Paulina FusiMark  Shogry M.D.   On: 04/07/2016 15:08   Dg Lumbar Puncture Fluoro Guide  Result Date: 04/07/2016 CLINICAL DATA:  Acute visual change EXAM: DIAGNOSTIC LUMBAR PUNCTURE UNDER FLUOROSCOPIC GUIDANCE FLUOROSCOPY TIME:  Fluoroscopy Time:  0 minutes 12 seconds Radiation Exposure Index (if provided by the fluoroscopic device): Number of Acquired Spot Images: 0 PROCEDURE: Informed consent was obtained from the patient prior to the procedure, including potential complications of headache, allergy, and pain. With the patient  prone, the lower back was prepped with Betadine. 1% Lidocaine was used for local anesthesia. Lumbar puncture was performed at the L3-4 level using a 18 gauge needle with return of clear CSF with an opening pressure of 38 cm water. Eleven ml of CSF were obtained for laboratory studies. The patient tolerated the procedure well and there were no apparent complications. IMPRESSION: Successful lumbar puncture using fluoroscopy. Elevated opening pressure 38 cm H2O Electronically Signed   By: Marlan Palauharles  Clark M.D.   On: 04/07/2016 17:20    Procedures Procedures (including critical care time)  Medications Ordered in ED Medications  diphenhydrAMINE (BENADRYL) 50 MG/ML injection (not administered)  diphenhydrAMINE (BENADRYL) injection 50 mg (50 mg Intramuscular Given 04/07/16 1401)  gadobenate dimeglumine (MULTIHANCE) injection 20 mL (20 mLs Intravenous Contrast Given 04/07/16 1450)  acetaZOLAMIDE (DIAMOX) 12 hr capsule 500 mg (500 mg Oral Given 04/07/16 1801)     Initial Impression / Assessment and Plan / ED Course  I have reviewed the triage vital signs and the nursing notes.  Pertinent labs & imaging results that were available during my care of the patient were reviewed  by me and considered in my medical decision making (see chart for details).  Clinical Course     BP 133/97 (BP Location: Right Arm)   Pulse 70   Resp 17   Ht 5\' 6"  (1.676 m)   Wt 122.5 kg   LMP 04/07/2016   SpO2 100%   BMI 43.58 kg/m    Final Clinical Impressions(s) / ED Diagnoses   Final diagnoses:  Acute visual loss, left  Idiopathic intracranial hypertension    New Prescriptions New Prescriptions   ACETAZOLAMIDE (DIAMOX) 500 MG CAPSULE    Take 1 capsule (500 mg total) by mouth 2 (two) times daily.   11:26 AM Patient here with acute visual changes in the left eye, new papilledema to both eyes left greater than right, sent here from ophthalmologist for further evaluation. Ophthalmology request neuro consult, MRI,  and LP. Appreciate consultation from neurologist, Dr. Amada Jupiter who recommend brain MRI with and without contrast, orbital MRI with and without contrast. He will also see patient in the ER.  Care discussed with Dr. Donnald Garre.   3:34 PM Brain and orbit MRI showing no acute finding, normal optic nerve.  Dr. Amada Jupiter has evaluated pt, he recommend consulting IR for LP to check opening pressure. If above 25 then will treat for psueudotumor cerebri with acetazolamide 500mg  BID.  If pressure >25, then she will need to be admitted for optic neuritis with IV steroid 1g/day.  Dr. Amada Jupiter also consulted with ophthalmologist Dr. Alben Spittle and request for close outpt f/u as well.    Doubt temporal arteritis due to pt's age.     5:01 PM CSF pressure is 38, likely reflect pseudotumor cerebri.  11cm O2 of clear CSF fluid were removed.  I have sent CSF cell count with diff, glucose, culture.  Pt given acetazolamide 500mg  tab while in the ER.  Pt will be prescribe this medication for the month.  Pt has a scheduled appointment with PCP on Dec 11th.  I also encourage pt to f/u with ophthalmology and neurology in the mean time for further care  Return precaution discussed.    7:02 PM Dr. Amada Jupiter recommend increasing dose to 750mg  BID if pt able to tolerates.  Unfortunately pt has left before I can give her the new recommendation.  I hope she can get her medication adjust during her upcoming neuro visit outpt.   Fayrene Helper, PA-C 04/07/16 1833    Fayrene Helper, PA-C 04/07/16 1903    Arby Barrette, MD 04/12/16 [phone]

## 2016-04-07 NOTE — ED Notes (Signed)
Patient transported to MRI 

## 2016-04-07 NOTE — Discharge Instructions (Signed)
Please take medication as prescribed as treatment for your symptoms.  Follow up closely with your eye specialist and neurologist next week.  Establish care with a primary care provider as soon as you can.

## 2016-04-08 ENCOUNTER — Encounter: Payer: Self-pay | Admitting: Diagnostic Neuroimaging

## 2016-04-08 ENCOUNTER — Ambulatory Visit (INDEPENDENT_AMBULATORY_CARE_PROVIDER_SITE_OTHER): Payer: BLUE CROSS/BLUE SHIELD | Admitting: Diagnostic Neuroimaging

## 2016-04-08 VITALS — BP 115/84 | HR 77 | Ht 66.0 in | Wt 263.0 lb

## 2016-04-08 DIAGNOSIS — G932 Benign intracranial hypertension: Secondary | ICD-10-CM

## 2016-04-08 DIAGNOSIS — H53132 Sudden visual loss, left eye: Secondary | ICD-10-CM | POA: Diagnosis not present

## 2016-04-08 LAB — HIV ANTIBODY (ROUTINE TESTING W REFLEX): HIV Screen 4th Generation wRfx: NONREACTIVE

## 2016-04-08 MED ORDER — ACETAZOLAMIDE ER 500 MG PO CP12: 500.0000 mg | ORAL_CAPSULE | Freq: Two times a day (BID) | ORAL | 4 refills | Status: DC

## 2016-04-08 NOTE — Progress Notes (Signed)
[facility]  PATIENT: Stacy Mills DOB: 01/20/1996  REFERRING CLINICIAN: Willeen Niece, MD HISTORY FROM: patient and mother; reviewed ER notes REASON FOR VISIT: new consult    HISTORICAL  CHIEF COMPLAINT:  Chief Complaint  Patient presents with  . Rm 7    Mom, Judeth Cornfield  . New Patient (Initial Visit)    F/u ED visit yesterday for idiopathic intracranial hypertension. Started new rx for Diamox 500 mg BID.  Marland Kitchen Visual Field Change    Pt w/ h/o migraines and anxiety c/o dark spot in left eye as well as floaters and flashes of light. Vision: 20/200 on right w/o contacts, UTA left.    HISTORY OF PRESENT ILLNESS:   20 year old right-handed female here for evaluation of idiopathic intracranial hypertension. 04/04/16 patient noticed a dark spot in her left eye. On 04/05/16 patient went to ophthalmologist and was referred to the emergency room for evaluation. However she left before being seen. Patient returned to emergency room on 04/07/16 for evaluation after seeing ophthalmologist. Patient was diagnosed with bilateral papilledema, had MRI of the brain and orbits, lumbar puncture, and was diagnosed with idiopathic intracranial hypertension (pseudotumor cerebri). Patient was discharged on acetazolamide 500 mg twice a day. She took first dose last night and one doses morning. So far patient is stable.  Patient does report history of headaches since age 69 years old. Headaches have been worse in last 1 year. She is frontal severe headaches with photophobia, phonophobia and throbbing sensation. No nausea or vomiting.   REVIEW OF SYSTEMS: Full 14 system review of systems performed and negative with exception of: 100 pound weight gain over 2 years (150 lbs --> 263lbs)  ALLERGIES: Allergies  Allergen Reactions  . Gadolinium Derivatives Itching, Nausea Only and Cough    HOME MEDICATIONS: Outpatient Medications Prior to Visit  Medication Sig Dispense Refill  . acetaZOLAMIDE  (DIAMOX) 500 MG capsule Take 1 capsule (500 mg total) by mouth 2 (two) times daily. 70 capsule 0  . acetaZOLAMIDE (DIAMOX) 250 MG tablet Take 1 tablet (250 mg total) by mouth 2 (two) times daily as needed. 30 tablet 0  . etonogestrel-ethinyl estradiol (NUVARING) 0.12-0.015 MG/24HR vaginal ring Insert vaginally and leave in place for 3 consecutive weeks, then remove for 1 week. (Patient not taking: Reported on 04/07/2016) 1 each 12  . SUMAtriptan (IMITREX) 50 MG tablet Take 1 tablet (50 mg total) by mouth every 2 (two) hours as needed for migraine (Do not exceed 4 doses in one day.). May repeat in 2 hours if headache persists or recurs. (Patient not taking: Reported on 04/07/2016) 10 tablet 1   No facility-administered medications prior to visit.     PAST MEDICAL HISTORY: Past Medical History:  Diagnosis Date  . Allergy   . Anxiety   . Depression   . IUD (intrauterine device) in place    Paraguard placed 2014  . Migraine   . Pelvic inflammatory disease 10/2012    PAST SURGICAL HISTORY: Past Surgical History:  Procedure Laterality Date  . NO PAST SURGERIES      FAMILY HISTORY: Family History  Problem Relation Age of Onset  . Emphysema Maternal Grandfather   . Depression Maternal Grandfather   . Anxiety disorder Maternal Grandfather   . Cancer Paternal Grandmother   . Non-Hodgkin's lymphoma Paternal Grandmother   . Asthma Mother   . Hypertension Father     SOCIAL HISTORY:  Social History   Social History  . Marital status: Single    Spouse name:  n/a  . Number of children: 0  . Years of education: 12+   Occupational History  . College student    Social History Main Topics  . Smoking status: Current Some Day Smoker    Types: Cigarettes  . Smokeless tobacco: Never Used  . Alcohol use 0.0 oz/week     Comment: Monthly  . Drug use:     Types: Other-see comments     Comment: Quit date 02/13/15  . Sexual activity: Yes    Partners: Male    Birth control/ protection:  Condom, IUD   Other Topics Concern  . Not on file   Social History Narrative   Graduated from high school 10/2012, currently attending college.   Right-handed   Caffeine: 3x/wk     PHYSICAL EXAM  GENERAL EXAM/CONSTITUTIONAL: Vitals:  Vitals:   04/08/16 1026  BP: 115/84  Pulse: 77  Weight: 263 lb (119.3 kg)  Height: 5\' 6"  (1.676 m)     Body mass index is 42.45 kg/m.  Visual Acuity Screening   Right eye Left eye Both eyes  Without correction: 20/200 UTA   With correction:        Patient is in no distress; well developed, nourished and groomed; neck is supple  CARDIOVASCULAR:  Examination of carotid arteries is normal; no carotid bruits  Regular rate and rhythm, no murmurs  Examination of peripheral vascular system by observation and palpation is normal  EYES:  Ophthalmoscopic exam of optic discs and posterior segments is normal; EXCEPT BLURRED DISC MARGINS  MUSCULOSKELETAL:  Gait, strength, tone, movements noted in Neurologic exam below  NEUROLOGIC: MENTAL STATUS:  No flowsheet data found.  awake, alert, oriented to person, place and time  recent and remote memory intact  normal attention and concentration  language fluent, comprehension intact, naming intact,   fund of knowledge appropriate  CRANIAL NERVE:   2nd - BILATERAL PAPILLEDEMA  2nd, 3rd, 4th, 6th - pupils equal and reactive to light, visual fields full to confrontation, extraocular muscles intact, no nystagmus  5th - facial sensation symmetric  7th - facial strength symmetric  8th - hearing intact  9th - palate elevates symmetrically, uvula midline  11th - shoulder shrug symmetric  12th - tongue protrusion midline  MOTOR:   normal bulk and tone, full strength in the BUE, BLE  SENSORY:   normal and symmetric to light touch, temperature, vibration  COORDINATION:   finger-nose-finger, fine finger movements normal  REFLEXES:   deep tendon reflexes TRACE and  symmetric  GAIT/STATION:   narrow based gait; able to walk tandem    DIAGNOSTIC DATA (LABS, IMAGING, TESTING) - I reviewed patient records, labs, notes, testing and imaging myself where available.  Lab Results  Component Value Date   WBC 8.1 04/07/2016   HGB 12.7 04/07/2016   HCT 39.4 04/07/2016   MCV 84.9 04/07/2016   PLT 292 04/07/2016      Component Value Date/Time   NA 138 04/07/2016 1100   K 3.9 04/07/2016 1100   CL 107 04/07/2016 1100   CO2 22 04/07/2016 1100   GLUCOSE 81 04/07/2016 1100   BUN 10 04/07/2016 1100   CREATININE 0.87 04/07/2016 1100   CREATININE 0.93 11/05/2012 1015   CALCIUM 9.4 04/07/2016 1100   PROT 7.2 04/07/2016 1100   ALBUMIN 3.7 04/07/2016 1100   AST 24 04/07/2016 1100   ALT 24 04/07/2016 1100   ALKPHOS 48 04/07/2016 1100   BILITOT 0.4 04/07/2016 1100   GFRNONAA >60 04/07/2016 1100  GFRAA >60 04/07/2016 1100   No results found for: CHOL, HDL, LDLCALC, LDLDIRECT, TRIG, CHOLHDL No results found for: AOZH0QHGBA1C No results found for: VITAMINB12 Lab Results  Component Value Date   TSH 0.747 11/03/2014    04/07/16 MRI brain / orbits [I reviewed images myself and agree with interpretation. -VRP]  - Normal MRI of the brain and orbits. No finding to explain the clinical presentation. No sign of increased intracranial pressure by imaging. No focal brain or orbital finding.  - As discussed above, the patient should be considered to have a mild allergy to MultiHance. I would suggest premedication with Benadryl should she need MultiHance gadolinium contrast in the future.   04/07/16 lumbar puncture - Successful lumbar puncture using fluoroscopy. Elevated opening pressure 38 cm H2O     ASSESSMENT AND PLAN  [age] y.o. year old female here with significant weight gain in the last 2 years, with bilateral papilledema and headaches. Also with enlargement of left eye blind spot. Findings, signs and symptoms consistent with idiopathic intracranial  hypertension.   Dx:  1. IIH (idiopathic intracranial hypertension)   2. Severe obesity (BMI >= 40) (HCC)   3. Sudden visual loss of left eye     PLAN: - continue acetazolamide 500mg  twice a day - follow up with ophthalmology for serial eye exams and visual field testing to monitor effectiveness of acetazolamide - reviewed nutrition, fitness and stress mgmt advice with goal of gradual, sustained weight loss - follow up with PCP (new pt appt in May 09, 2016)  Meds ordered this encounter  Medications  . acetaZOLAMIDE (DIAMOX) 500 MG capsule    Sig: Take 1 capsule (500 mg total) by mouth 2 (two) times daily.    Dispense:  180 capsule    Refill:  4   Return in about 3 months (around 07/09/2016).  I reviewed images, labs, notes, records myself. I summarized findings and reviewed with patient, for this high risk condition (vision loss; increased intracranial pressure) requiring high complexity decision making.    Suanne MarkerVIKRAM R. Bishoy Cupp, MD 04/08/2016, 10:42 AM Certified in Neurology, Neurophysiology and Neuroimaging  [facility] [address], [address] [place], [place] [address] [phone]

## 2016-04-08 NOTE — Patient Instructions (Signed)
Thank you for coming to see [place] at [facility]. I hope we have been able to provide you high quality care today.  You may receive a patient satisfaction survey over the next few weeks. We would appreciate your feedback and comments so that we may continue to improve ourselves and the health of our patients.  - continue acetazolamide 557m twice a day   ~~~~~~~~~~~~~~~~~~~~~~~~~~~~~~~~~~~~~~~~~~~~~~~~~~~~~~~~~~~~~~~~~  DR. PENUMALLI'S GUIDE TO HAPPY AND HEALTHY LIVING These are some of my general health and wellness recommendations. Some of them may apply to you better than others. Please use common sense as you try these suggestions and feel free to ask me any questions.   ACTIVITY/FITNESS Mental, social, emotional and physical stimulation are very important for brain and body health. Try learning a new activity (arts, music, language, sports, games).  Keep moving your body to the best of your abilities. You can do this at home, inside or outside, the park, community center, gym or anywhere you like. Consider a physical therapist or personal trainer to get started. Consider the app Sworkit. Fitness trackers such as smart-watches, smart-phones or Fitbits can help as well.   NUTRITION Eat more plants: colorful vegetables, nuts, seeds and berries.  Eat less sugar, salt, preservatives and processed foods.  Avoid toxins such as cigarettes and alcohol.  Drink water when you are thirsty. Warm water with a slice of lemon is an excellent morning drink to start the day.  Consider these websites for more information The Nutrition Source (hhttps://www.henry-hernandez.biz/ [employer] Nutrition (wWindowBlog.ch   RELAXATION Consider practicing mindfulness meditation or other relaxation techniques such as deep breathing, prayer, yoga, tai chi, massage. See website mindful.org or the apps Headspace or Calm to help get  started.   SLEEP Try to get at least 7-8+ hours sleep per day. Regular exercise and reduced caffeine will help you sleep better. Practice good sleep hygeine techniques. See website sleep.org for more information.   PLANNING Prepare estate planning, living will, healthcare POA documents. Sometimes this is best planned with the help of an attorney. Theconversationproject.org and agingwithdignity.org are excellent resources.

## 2016-04-09 ENCOUNTER — Emergency Department (HOSPITAL_COMMUNITY)
Admission: EM | Admit: 2016-04-09 | Discharge: 2016-04-09 | Disposition: A | Discharge status: Home / Self Care | Payer: BLUE CROSS/BLUE SHIELD | Attending: Emergency Medicine | Admitting: Emergency Medicine

## 2016-04-09 ENCOUNTER — Encounter (HOSPITAL_COMMUNITY): Payer: Self-pay

## 2016-04-09 DIAGNOSIS — R202 Paresthesia of skin: Secondary | ICD-10-CM | POA: Diagnosis present

## 2016-04-09 DIAGNOSIS — F17210 Nicotine dependence, cigarettes, uncomplicated: Secondary | ICD-10-CM | POA: Insufficient documentation

## 2016-04-09 LAB — COMPREHENSIVE METABOLIC PANEL
ALK PHOS: 51 U/L (ref 38–126)
ALT: 24 U/L (ref 14–54)
AST: 20 U/L (ref 15–41)
Albumin: 3.8 g/dL (ref 3.5–5.0)
Anion gap: 8 (ref 5–15)
BUN: 12 mg/dL (ref 6–20)
CALCIUM: 9.7 mg/dL (ref 8.9–10.3)
CO2: 20 mmol/L — ABNORMAL LOW (ref 22–32)
CREATININE: 1.11 mg/dL — AB (ref 0.44–1.00)
Chloride: 112 mmol/L — ABNORMAL HIGH (ref 101–111)
Glucose, Bld: 98 mg/dL (ref 65–99)
Potassium: 4.1 mmol/L (ref 3.5–5.1)
Sodium: 140 mmol/L (ref 135–145)
TOTAL PROTEIN: 7.5 g/dL (ref 6.5–8.1)
Total Bilirubin: 0.3 mg/dL (ref 0.3–1.2)

## 2016-04-09 LAB — CBC WITH DIFFERENTIAL/PLATELET
Basophils Absolute: 0.1 10*3/uL (ref 0.0–0.1)
Basophils Relative: 1 %
EOS PCT: 2 %
Eosinophils Absolute: 0.2 10*3/uL (ref 0.0–0.7)
HCT: 39.9 % (ref 36.0–46.0)
HEMOGLOBIN: 12.9 g/dL (ref 12.0–15.0)
LYMPHS ABS: 4.1 10*3/uL — AB (ref 0.7–4.0)
LYMPHS PCT: 38 %
MCH: 27.6 pg (ref 26.0–34.0)
MCHC: 32.3 g/dL (ref 30.0–36.0)
MCV: 85.3 fL (ref 78.0–100.0)
MONOS PCT: 7 %
Monocytes Absolute: 0.7 10*3/uL (ref 0.1–1.0)
Neutro Abs: 5.6 10*3/uL (ref 1.7–7.7)
Neutrophils Relative %: 52 %
Platelets: 332 10*3/uL (ref 150–400)
RBC: 4.68 MIL/uL (ref 3.87–5.11)
RDW: 12.6 % (ref 11.5–15.5)
WBC: 10.7 10*3/uL — AB (ref 4.0–10.5)

## 2016-04-09 MED ORDER — ACETAMINOPHEN 325 MG PO TABS
650.0000 mg | ORAL_TABLET | Freq: Once | ORAL | Status: AC
Start: 2016-04-09 — End: 2016-04-09
  Administered 2016-04-09: 650 mg via ORAL
  Filled 2016-04-09: qty 2

## 2016-04-09 NOTE — Discharge Instructions (Signed)
I suspect your tingling and numbness is from medication. Take tylenol or Excedrin for headache. Continue medications. Call neurologist on Monday if continue to have symptoms.

## 2016-04-09 NOTE — ED Triage Notes (Signed)
Patient here to be evaluated for possible side effect from medication she started on Thursday. Taking acetazolamide for increased CSF pressure that was discovered following spinal tap. Complains of tingling to fingers and toes. NAD.

## 2016-04-09 NOTE — ED Notes (Signed)
Papers reviewed with patient. Still a headache but tylenol given with D/C

## 2016-04-09 NOTE — ED Provider Notes (Signed)
[facility]-EMERGENCY DEPT Provider Note   CSN: [id number] Arrival date & time: 04/09/16  1636     History   Chief Complaint No chief complaint on file.   HPI Stacy Mills is a 20 y.o. female.  HPI Stacy Mills is a 20 y.o. female with recent diagnosed of possible pseudotumor cerebri and increased intracranial pressure, presents to ED with complaint of tingling to finger tips and toes after starting  acetazolomide. Pt states she started medication 3 days ago when she had 1 dose, and states she again took it yesterday morning, yesterday evening, and this morning. Pt reports that shortly after taking medication each time she develops tingling sensation in hands and feet. States symptoms began yesterday. Denies numbness or weakness. Denies pain. Continues to have dull headaches. No neck pain. States having some pain at the site of LP which she had 3 days ago. Pt denies any other complaints.   Past Medical History:  Diagnosis Date  . Allergy   . Anxiety   . Depression   . IUD (intrauterine device) in place    Paraguard placed 2014  . Migraine   . Pelvic inflammatory disease 10/2012    Patient Active Problem List   Diagnosis Date Noted  . Idiopathic intracranial hypertension   . Migraines 12/22/2014  . IUD (intrauterine device) in place 01/10/2013  . PID (acute pelvic inflammatory disease) 11/07/2012  . Hemorrhagic cyst of ovary 11/07/2012  . Severe obesity (BMI >= 40) (HCC) 08/26/2012    Past Surgical History:  Procedure Laterality Date  . NO PAST SURGERIES      OB History    Gravida Para Term Preterm AB Living   0       0 0   SAB TAB Ectopic Multiple Live Births       0           Home Medications    Prior to Admission medications   Medication Sig Start Date End Date Taking? Authorizing Provider  acetaminophen (TYLENOL) 325 MG tablet Take 650 mg by mouth every 6 (six) hours as needed.    Historical Provider, MD  acetaZOLAMIDE (DIAMOX) 500 MG capsule Take 1 capsule  (500 mg total) by mouth 2 (two) times daily. 04/08/16   Suanne MarkerVikram R Penumalli, MD  ibuprofen (ADVIL,MOTRIN) 200 MG tablet Take 200 mg by mouth every 8 (eight) hours as needed.    Historical Provider, MD    Family History Family History  Problem Relation Age of Onset  . Emphysema Maternal Grandfather   . Depression Maternal Grandfather   . Anxiety disorder Maternal Grandfather   . Cancer Paternal Grandmother   . Non-Hodgkin's lymphoma Paternal Grandmother   . Asthma Mother   . Hypertension Father     Social History Social History  Substance Use Topics  . Smoking status: Current Some Day Smoker    Types: Cigarettes  . Smokeless tobacco: Never Used  . Alcohol use 0.0 oz/week     Comment: Monthly     Allergies   Gadolinium derivatives   Review of Systems Review of Systems  Constitutional: Negative for chills and fever.  Respiratory: Negative for cough, chest tightness and shortness of breath.   Cardiovascular: Negative for chest pain, palpitations and leg swelling.  Gastrointestinal: Negative for abdominal pain, diarrhea, nausea and vomiting.  Genitourinary: Negative for dysuria, flank pain and pelvic pain.  Musculoskeletal: Negative for arthralgias, myalgias, neck pain and neck stiffness.  Skin: Negative for rash.  Neurological: Positive for headaches. Negative for dizziness, weakness  and numbness.       Positive for paresthesias  All other systems reviewed and are negative.    Physical Exam Updated Vital Signs BP 144/92   Pulse 84   Temp 98 F (36.7 C) (Oral)   Resp 18   LMP 04/07/2016   SpO2 100%   Physical Exam  Constitutional: She is oriented to person, place, and time. She appears well-developed and well-nourished. No distress.  HENT:  Head: Normocephalic.  Eyes: Conjunctivae are normal.  Neck: Neck supple.  Cardiovascular: Normal rate, regular rhythm and normal heart sounds.   Pulmonary/Chest: Effort normal and breath sounds normal. No respiratory  distress. She has no wheezes. She has no rales.  Abdominal: Soft. Bowel sounds are normal. She exhibits no distension. There is no tenderness. There is no rebound.  Musculoskeletal: She exhibits no edema.  Full rom and normal sensation in bialteral upper and lower extremities. Cap refill <2 sec  Neurological: She is alert and oriented to person, place, and time. No cranial nerve deficit. Coordination normal.  5/5 and equal upper and lower extremity strength bilaterally. Equal grip strength bilaterally. Normal finger to nose and heel to shin. No pronator drift. Patellar reflexes 2+   Skin: Skin is warm and dry.  Psychiatric: She has a normal mood and affect. Her behavior is normal.  Nursing note and vitals reviewed.    ED Treatments / Results  Labs (all labs ordered are listed, but only abnormal results are displayed) Labs Reviewed  CBC WITH DIFFERENTIAL/PLATELET - Abnormal; Notable for the following:       Result Value   WBC 10.7 (*)    Lymphs Abs 4.1 (*)    All other components within normal limits  COMPREHENSIVE METABOLIC PANEL - Abnormal; Notable for the following:    Chloride 112 (*)    CO2 20 (*)    Creatinine, Ser 1.11 (*)    All other components within normal limits    EKG  EKG Interpretation None       Radiology No results found.  Procedures Procedures (including critical care time)  Medications Ordered in ED Medications  acetaminophen (TYLENOL) tablet 650 mg (650 mg Oral Given 04/09/16 1843)     Initial Impression / Assessment and Plan / ED Course  I have reviewed the triage vital signs and the nursing notes.  Pertinent labs & imaging results that were available during my care of the patient were reviewed by me and considered in my medical decision making (see chart for details).  Clinical Course    Pt with new paresthesias. Normal neuro exam. Will check electrolytes. Just started acetazolamide.   Electrolytes with no significant findings. Question  medication reaction. Normal neuro exam. Pt in NAD. Home with neurology follow up.   Vitals:   04/09/16 1730 04/09/16 1815 04/09/16 1830 04/09/16 1845  BP: 111/84 129/94 135/89 144/92  Pulse: 78 76 74 84  Resp:      Temp:      TempSrc:      SpO2: 100% 100% 100% 100%     Final Clinical Impressions(s) / ED Diagnoses   Final diagnoses:  Paresthesia    New Prescriptions New Prescriptions   No medications on file     Jaynie Crumbleatyana Ovie Cornelio, PA-C 04/09/16 1915    Pricilla LovelessScott Goldston, MD 04/09/16 2329

## 2016-04-11 ENCOUNTER — Telehealth: Payer: Self-pay | Admitting: Diagnostic Neuroimaging

## 2016-04-11 LAB — CSF CULTURE W GRAM STAIN: Culture: NO GROWTH

## 2016-04-11 LAB — CSF CULTURE

## 2016-04-11 NOTE — Telephone Encounter (Signed)
Discussed with Dr Penumalli, aMarjory Liesnd per Dr Merceda ElksPenumallli, called patient and advised her the symptoms are most likely from Diamox. However she should continue medication as the symptoms should improve over the next week. Also advised her that the medication will need time to get her headaches under control. Advised she call back for any new issues, questions. She verbalized understanding, appreciation.

## 2016-04-11 NOTE — Telephone Encounter (Signed)
[facility]. Please call [phone].

## 2016-04-11 NOTE — Telephone Encounter (Signed)
LVM informing patient that this RN was calling for more information. Also advised her that the symptoms described do not appear to be side effects of Diamox. However this RN will discuss with Dr Marjory LiesPenumalli. Left name, number for call back.

## 2016-04-11 NOTE — Telephone Encounter (Signed)
Pt's mother called to advise the [facility]. She is experiencing numbness in feet, hands, and this morning the kneecaps. The ED thought it could be acetaZOLAMIDE (DIAMOX) 500 MG capsule .  She also said the HA's are getting more persistent and not going away. Please call

## 2016-04-15 ENCOUNTER — Telehealth: Payer: Self-pay | Admitting: Diagnostic Neuroimaging

## 2016-04-15 ENCOUNTER — Encounter: Payer: Self-pay | Admitting: *Deleted

## 2016-04-15 NOTE — Telephone Encounter (Signed)
Letter signed, at front desk for pick up by patient's mother.

## 2016-04-15 NOTE — Telephone Encounter (Signed)
Pt's mother called in asking for a letter to allow the pt to take her medication while at work ( twice a day 7a and 7p).  Attn: Human Resources Mother will come pick it up. Please call and advise [phone]

## 2016-04-15 NOTE — Telephone Encounter (Signed)
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 She stated her daughter's supervisor advised HR needs a letter stating she must take her medication at 7 pm, so that there is no recourse when she leaves her desk to take the medication. Mother stated she would most likely pick up the letter next Monday. Advised her of office hours from today through next Friday. Informed her letter will be at front desk, advised she bring photo ID. She verbalized understanding, appreciation.

## 2016-04-27 ENCOUNTER — Telehealth: Payer: Self-pay | Admitting: Diagnostic Neuroimaging

## 2016-04-27 NOTE — Telephone Encounter (Signed)
Per Dr Marjory LiesPenumalli, spoke with patient's mother and advised Crystie reduce Diamox to 500 mg once a day. Otherwise she may stop med x 1 week to see if side effects resolve. Advised her that Dr Marjory LiesPenumalli feels her symptoms are related to medication. Mother stated she would have daughter reduce dose x 1 week and if still having side effects, she will stop x 1 week. Mother verbalized understanding, appreciation.

## 2016-04-27 NOTE — Telephone Encounter (Signed)
Patient's mother is calling. The patient was seen in our office on 04-08-16 and was given medication acetaZOLAMIDE (DIAMOX) 500 MG capsule. Numbness started in her hands and feet a few days after taking this medication. Last Wednesday her knee started getting numb and yesterday her lip started getting numb. Please call to advise. This message should have been added to 04-11-16 message.

## 2016-04-27 NOTE — Telephone Encounter (Signed)
Try reducing to diamox 500mg  daily to se if sxs resolve. Otherwise ok to stop for 1 week. -VRP

## 2016-05-09 ENCOUNTER — Other Ambulatory Visit (INDEPENDENT_AMBULATORY_CARE_PROVIDER_SITE_OTHER): Payer: BLUE CROSS/BLUE SHIELD

## 2016-05-09 ENCOUNTER — Ambulatory Visit (INDEPENDENT_AMBULATORY_CARE_PROVIDER_SITE_OTHER): Payer: BLUE CROSS/BLUE SHIELD | Admitting: Gynecology

## 2016-05-09 ENCOUNTER — Encounter: Payer: Self-pay | Admitting: Nurse Practitioner

## 2016-05-09 ENCOUNTER — Ambulatory Visit (INDEPENDENT_AMBULATORY_CARE_PROVIDER_SITE_OTHER): Payer: BLUE CROSS/BLUE SHIELD | Admitting: Nurse Practitioner

## 2016-05-09 ENCOUNTER — Encounter: Payer: Self-pay | Admitting: Gynecology

## 2016-05-09 VITALS — BP 128/72

## 2016-05-09 VITALS — BP 114/72 | HR 76 | Temp 97.9°F | Ht 66.0 in | Wt 265.0 lb

## 2016-05-09 DIAGNOSIS — Z13220 Encounter for screening for lipoid disorders: Secondary | ICD-10-CM

## 2016-05-09 DIAGNOSIS — G932 Benign intracranial hypertension: Secondary | ICD-10-CM

## 2016-05-09 DIAGNOSIS — Z0000 Encounter for general adult medical examination without abnormal findings: Secondary | ICD-10-CM | POA: Diagnosis not present

## 2016-05-09 DIAGNOSIS — Z30011 Encounter for initial prescription of contraceptive pills: Secondary | ICD-10-CM

## 2016-05-09 DIAGNOSIS — Z136 Encounter for screening for cardiovascular disorders: Secondary | ICD-10-CM

## 2016-05-09 DIAGNOSIS — Z113 Encounter for screening for infections with a predominantly sexual mode of transmission: Secondary | ICD-10-CM

## 2016-05-09 LAB — LIPID PANEL
Cholesterol: 145 mg/dL (ref 0–200)
HDL: 48.9 mg/dL (ref >39.00)
LDL CALC: 82 mg/dL (ref 0–99)
NONHDL: 96.11
Total CHOL/HDL Ratio: 3
Triglycerides: 70 mg/dL (ref 0.0–149.0)
VLDL: 14 mg/dL (ref 0.0–40.0)

## 2016-05-09 LAB — BASIC METABOLIC PANEL
BUN: 11 mg/dL (ref 6–23)
CO2: 22 mEq/L (ref 19–32)
Calcium: 9 mg/dL (ref 8.4–10.5)
Chloride: 111 mEq/L (ref 96–112)
Creatinine, Ser: 0.9 mg/dL (ref 0.40–1.20)
GFR: 83.33 mL/min (ref >60.00)
GLUCOSE: 90 mg/dL (ref 70–99)
Potassium: 3.8 mEq/L (ref 3.5–5.1)
SODIUM: 140 meq/L (ref 135–145)

## 2016-05-09 LAB — HEPATITIS C ANTIBODY: HCV Ab: NEGATIVE

## 2016-05-09 LAB — HEPATITIS B SURFACE ANTIGEN: HEP B S AG: NEGATIVE

## 2016-05-09 LAB — HEMOGLOBIN A1C: HEMOGLOBIN A1C: 5.4 % (ref 4.6–6.5)

## 2016-05-09 MED ORDER — LEVONORGESTREL-ETHINYL ESTRAD 0.1-20 MG-MCG PO TABS: 1.0000 | ORAL_TABLET | Freq: Every day | ORAL | 11 refills | Status: DC

## 2016-05-09 NOTE — Progress Notes (Signed)
Pre visit review using our clinic review tool, if applicable. No additional management support is needed unless otherwise documented below in the visit note. 

## 2016-05-09 NOTE — Patient Instructions (Signed)
Oral Contraception Information Oral contraceptive pills (OCPs) are medicines taken to prevent pregnancy. OCPs work by preventing the ovaries from releasing eggs. The hormones in OCPs also cause the cervical mucus to thicken, preventing the sperm from entering the uterus. The hormones also cause the uterine lining to become thin, not allowing a fertilized egg to attach to the inside of the uterus. OCPs are highly effective when taken exactly as prescribed. However, OCPs do not prevent sexually transmitted diseases (STDs). Safe sex practices, such as using condoms along with the pill, can help prevent STDs.  Before taking the pill, you may have a physical exam and Pap test. Your health care provider may order blood tests. The health care provider will make sure you are a good candidate for oral contraception. Discuss with your health care provider the possible side effects of the OCP you may be prescribed. When starting an OCP, it can take 2 to 3 months for the body to adjust to the changes in hormone levels in your body.  TYPES OF ORAL CONTRACEPTION  The combination pill-This pill contains estrogen and progestin (synthetic progesterone) hormones. The combination pill comes in 21-day, 28-day, or 91-day packs. Some types of combination pills are meant to be taken continuously (365-day pills). With 21-day packs, you do not take pills for 7 days after the last pill. With 28-day packs, the pill is taken every day. The last 7 pills are without hormones. Certain types of pills have more than 21 hormone-containing pills. With 91-day packs, the first 84 pills contain both hormones, and the last 7 pills contain no hormones or contain estrogen only.  The minipill-This pill contains the progesterone hormone only. The pill is taken every day continuously. It is very important to take the pill at the same time each day. The minipill comes in packs of 28 pills. All 28 pills contain the hormone.  ADVANTAGES OF ORAL  CONTRACEPTIVE PILLS  Decreases premenstrual symptoms.   Treats menstrual period cramps.   Regulates the menstrual cycle.   Decreases a heavy menstrual flow.   May treatacne, depending on the type of pill.   Treats abnormal uterine bleeding.   Treats polycystic ovarian syndrome.   Treats endometriosis.   Can be used as emergency contraception.  THINGS THAT CAN MAKE ORAL CONTRACEPTIVE PILLS LESS EFFECTIVE OCPs can be less effective if:   You forget to take the pill at the same time every day.   You have a stomach or intestinal disease that lessens the absorption of the pill.   You take OCPs with other medicines that make OCPs less effective, such as antibiotics, certain HIV medicines, and some seizure medicines.   You take expired OCPs.   You forget to restart the pill on day 7, when using the packs of 21 pills.  RISKS ASSOCIATED WITH ORAL CONTRACEPTIVE PILLS  Oral contraceptive pills can sometimes cause side effects, such as:  Headache.  Nausea.  Breast tenderness.  Irregular bleeding or spotting. Combination pills are also associated with a small increased risk of:  Blood clots.  Heart attack.  Stroke. This information is not intended to replace advice given to you by your health care provider. Make sure you discuss any questions you have with your health care provider. Document Released: 08/06/2002 Document Revised: 09/07/2015 Document Reviewed: 11/04/2012 Elsevier Interactive Patient Education  2017 Elsevier Inc.  

## 2016-05-09 NOTE — Assessment & Plan Note (Addendum)
Monitored by neurologist: Dr. Marjory LiesPenumalli, last seen 03/2016 Current use of diamox 500mg  once a day

## 2016-05-09 NOTE — Patient Instructions (Signed)

## 2016-05-09 NOTE — Progress Notes (Signed)
Subjective:    Patient ID: Stacy Mills, female    DOB: 09-20-1995, 20 y.o.   MRN: [id number]  Patient presents today for complete physical or establish care (new patient)  HPI  Immunizations: (TDAP, Hep C screen, Pneumovax, Influenza, zoster)  Health Maintenance  Topic Date Due  . Chlamydia screening  11/03/2015  . Pap Smear  11/04/2018  . Tetanus Vaccine  12/18/2022  . Flu Shot  Completed  . HIV Screening  Completed   Diet:regular Weight:  Wt Readings from Last 3 Encounters:  05/09/16 265 lb (120.2 kg)  04/08/16 263 lb (119.3 kg)  04/07/16 270 lb (122.5 kg)   Exercise:none Fall Risk: Fall Risk  05/09/2016  Falls in the past year? No   Home Safety:home with parents Depression/Suicide: Depression screen [facility] 2/9 05/09/2016 12/22/2014  Decreased Interest 0 0  Down, Depressed, Hopeless 0 0  PHQ - 2 Score 0 0   No flowsheet data found. Pap Smear (every 65yr for >21-29 without HPV, every 535yrfor >30-6532yrith HPV):up to date, completed HPV immunization series. Vision:29mo67montho, monitioring glaucoma Dental:needed Advanced Directive: Advanced Directives 04/09/2016  Does Patient Have a Medical Advance Directive? No  Would patient like information on creating a medical advance directive? No - patient declined information   Sexual History (birth control, marital status, STD):not sexually active, decline chlamydia testing  Medications and allergies reviewed with patient and updated if appropriate.  Patient Active Problem List   Diagnosis Date Noted  . Idiopathic intracranial hypertension   . Migraines 12/22/2014  . IUD (intrauterine device) in place 01/10/2013  . PID (acute pelvic inflammatory disease) 11/07/2012  . Hemorrhagic cyst of ovary 11/07/2012  . Severe obesity (BMI >= 40) (HCC)[place]/30/2014    Current Outpatient Prescriptions on File Prior to Visit  Medication Sig Dispense Refill  . acetaminophen (TYLENOL) 325 MG tablet Take 650 mg by mouth every 6  (six) hours as needed.    . acMarland KitchentaZOLAMIDE (DIAMOX) 500 MG capsule Take 1 capsule (500 mg total) by mouth 2 (two) times daily. (Patient taking differently: Take 500 mg by mouth 2 (two) times daily. Pt report taking 1 once a day. per Dr. PenaEverardo [facility] neurolog[facility]) 180 capsule 4  . ibuprofen (ADVIL,MOTRIN) 200 MG tablet Take 200 mg by mouth every 8 (eight) hours as needed.     No current facility-administered medications on file prior to visit.     Past Medical History:  Diagnosis Date  . Allergy   . Anxiety   . Depression   . IUD (intrauterine device) in place    Paraguard placed 2014  . Migraine   . Pelvic inflammatory disease 10/2012    Past Surgical History:  Procedure Laterality Date  . BRAIN SURGERY    . NO PAST SURGERIES      Social History   Social History  . Marital status: Single    Spouse name: n/a  . Number of children: 0  . Years of education: 12+   Occupational History  . College student    Social History Main Topics  . Smoking status: Never Smoker  . Smokeless tobacco: Never Used  . Alcohol use 1.2 oz/week    2 Shots of liquor per week     Comment: Monthly  . Drug use:     Types: Other-see comments, Marijuana     Comment: Quit date 02/13/15  . Sexual activity: Not Currently    Partners: Male    Birth control/ protection: Condom   Other Topics Concern  .  None   Social History Narrative   Graduated from high school 10/2012, currently attending college.   Right-handed   Caffeine: 3x/wk    Family History  Problem Relation Age of Onset  . Emphysema Maternal Grandfather   . Depression Maternal Grandfather   . Anxiety disorder Maternal Grandfather   . Mental illness Maternal Grandfather   . Diabetes Maternal Grandfather   . Cancer Paternal Grandmother   . Non-Hodgkin's lymphoma Paternal Grandmother   . Sudden death Paternal Grandmother   . Asthma Mother   . Hypertension Father   . Alcohol abuse Maternal Aunt   . Hyperlipidemia Maternal Aunt          Review of Systems  Constitutional: Negative for fever, malaise/fatigue and weight loss.  HENT: Negative for congestion and sore throat.   Eyes: Negative for blurred vision.       Negative for visual changes. Current use of contacts  Respiratory: Negative for cough and shortness of breath.   Cardiovascular: Negative for chest pain, palpitations and leg swelling.  Gastrointestinal: Negative for blood in stool, constipation, diarrhea and heartburn.  Genitourinary: Negative for dysuria, frequency and urgency.  Musculoskeletal: Negative for falls, joint pain and myalgias.  Skin: Negative for rash.  Neurological: Negative for dizziness, tingling, tremors, sensory change, speech change, focal weakness, seizures, loss of consciousness and headaches.  Endo/Heme/Allergies: Does not bruise/bleed easily.  Psychiatric/Behavioral: Negative for depression, memory loss, substance abuse and suicidal ideas. The patient is not nervous/anxious and does not have insomnia.     Objective:   Vitals:   05/09/16 1134  BP: 114/72  Pulse: 76  Temp: 97.9 F (36.6 C)    Body mass index is 42.77 kg/m.   Physical Examination:  Physical Exam  Constitutional: She is oriented to person, place, and time and well-developed, well-nourished, and in no distress. No distress.  HENT:  Right Ear: External ear normal.  Left Ear: External ear normal.  Nose: Nose normal.  Mouth/Throat: No oropharyngeal exudate.  Eyes: Conjunctivae and EOM are normal. Pupils are equal, round, and reactive to light. No scleral icterus.  Neck: Normal range of motion. Neck supple. No thyromegaly present.  Cardiovascular: Normal rate, regular rhythm, normal heart sounds and intact distal pulses.   Pulmonary/Chest: Effort normal and breath sounds normal. Right breast exhibits no inverted nipple, no mass, no nipple discharge, no skin change and no tenderness. Left breast exhibits no mass, no nipple discharge, no skin change and no  tenderness. Breasts are symmetrical.  Abdominal: Soft. Bowel sounds are normal. She exhibits no distension. There is no tenderness.  Genitourinary: Rectum normal and vulva normal. Cervix exhibits no motion tenderness.  Genitourinary Comments: Deferred by patient to GYN  Musculoskeletal: Normal range of motion. She exhibits no edema or tenderness.  Lymphadenopathy:    She has no cervical adenopathy.  Neurological: She is alert and oriented to person, place, and time. Gait normal.  Skin: Skin is warm and dry.  Psychiatric: Affect and judgment normal.  Vitals reviewed.   ASSESSMENT and PLAN:  Lindaann was seen today for establish care.  Diagnoses and all orders for this visit:  Preventative health care  Severe obesity (BMI >= 40) ([place]) -     Basic Metabolic Panel (BMET); Future -     Hemoglobin A1c; Future  Idiopathic intracranial hypertension -     Basic Metabolic Panel (BMET); Future  Encounter for lipid screening for cardiovascular disease -     Lipid panel; Future   Idiopathic intracranial hypertension Monitored by  neurologist: Dr. Leta Baptist, last seen 03/2016 Current use of diamox 580m once a day  Severe obesity (BMI >= 40) conseled about need for weight loss through healthy diet and exercise.    Recent Results (from the past 2160 hour(s))  CBC with Differential/Platelet     Status: None   Collection Time: 04/07/16 11:00 AM  Result Value Ref Range   WBC 8.1 4.0 - 10.5 K/uL   RBC 4.64 3.87 - 5.11 MIL/uL   Hemoglobin 12.7 12.0 - 15.0 g/dL   HCT 39.4 36.0 - 46.0 %   MCV 84.9 78.0 - 100.0 fL   MCH 27.4 26.0 - 34.0 pg   MCHC 32.2 30.0 - 36.0 g/dL   RDW 12.8 11.5 - 15.5 %   Platelets 292 150 - 400 K/uL   Neutrophils Relative % 44 %   Neutro Abs 3.6 1.7 - 7.7 K/uL   Lymphocytes Relative 47 %   Lymphs Abs 3.8 0.7 - 4.0 K/uL   Monocytes Relative 7 %   Monocytes Absolute 0.6 0.1 - 1.0 K/uL   Eosinophils Relative 2 %   Eosinophils Absolute 0.2 0.0 - 0.7 K/uL    Basophils Relative 0 %   Basophils Absolute 0.0 0.0 - 0.1 K/uL  Comprehensive metabolic panel     Status: None   Collection Time: 04/07/16 11:00 AM  Result Value Ref Range   Sodium 138 135 - 145 mmol/L   Potassium 3.9 3.5 - 5.1 mmol/L   Chloride 107 101 - 111 mmol/L   CO2 22 22 - 32 mmol/L   Glucose, Bld 81 65 - 99 mg/dL   BUN 10 6 - 20 mg/dL   Creatinine, Ser 0.87 0.44 - 1.00 mg/dL   Calcium 9.4 8.9 - 10.3 mg/dL   Total Protein 7.2 6.5 - 8.1 g/dL   Albumin 3.7 3.5 - 5.0 g/dL   AST 24 15 - 41 U/L   ALT 24 14 - 54 U/L   Alkaline Phosphatase 48 38 - 126 U/L   Total Bilirubin 0.4 0.3 - 1.2 mg/dL   GFR calc non Af Amer >60 >60 mL/min   GFR calc Af Amer >60 >60 mL/min    Comment: (NOTE) The eGFR has been calculated using the CKD EPI equation. This calculation has not been validated in all clinical situations. eGFR's persistently <60 mL/min signify possible Chronic Kidney Disease.    Anion gap 9 5 - 15  Sedimentation rate     Status: Abnormal   Collection Time: 04/07/16 11:00 AM  Result Value Ref Range   Sed Rate 30 (H) 0 - 22 mm/hr  HIV antibody     Status: None   Collection Time: 04/07/16  3:40 PM  Result Value Ref Range   HIV Screen 4th Generation wRfx Non Reactive Non Reactive    Comment: (NOTE) [facility]formed At[id number] RompMD PEH:[id number]  CSF culture     Status: None   Collection Time: 04/07/16  5:05 PM  Result Value Ref Range   Specimen Description CSF    Special Requests NONE    Gram Stain      WBC PRESENT, PREDOMINANTLY MONONUCLEAR NO ORGANISMS SEEN CYTOSPIN    Culture NO GROWTH 3 DAYS    Report Status 04/11/2016 FINAL   CSF cell count with differential collection tube #: 1     Status: Abnormal   Collection Time: 04/07/16  5:30 PM  Result Value Ref Range   Tube # 1  Color, CSF COLORLESS COLORLESS   Appearance, CSF CLEAR CLEAR   Supernatant NOT INDICATED    RBC Count, CSF 1 (H) 0 /cu mm    WBC, CSF 7 (H) 0 - 5 /cu mm   Lymphs, CSF FEW 40 - 80 %   Monocyte-Macrophage-Spinal Fluid RARE 15 - 45 %   Other Cells, CSF TOO FEW TO COUNT, SMEAR AVAILABLE FOR REVIEW   CSF cell count with differential collection tube #: 4     Status: None   Collection Time: 04/07/16  5:32 PM  Result Value Ref Range   Tube # 4    Color, CSF COLORLESS COLORLESS   Appearance, CSF CLEAR CLEAR   Supernatant NOT INDICATED    RBC Count, CSF 0 0 /cu mm   WBC, CSF 5 0 - 5 /cu mm   Lymphs, CSF FEW 40 - 80 %   Monocyte-Macrophage-Spinal Fluid RARE 15 - 45 %   Other Cells, CSF TOO FEW TO COUNT, SMEAR AVAILABLE FOR REVIEW   Glucose, CSF     Status: None   Collection Time: 04/07/16  5:36 PM  Result Value Ref Range   Glucose, CSF 42 40 - 70 mg/dL  Protein, CSF     Status: None   Collection Time: 04/07/16  5:36 PM  Result Value Ref Range   Total  Protein, CSF 28 15 - 45 mg/dL  CBC with Differential     Status: Abnormal   Collection Time: 04/09/16  5:30 PM  Result Value Ref Range   WBC 10.7 (H) 4.0 - 10.5 K/uL   RBC 4.68 3.87 - 5.11 MIL/uL   Hemoglobin 12.9 12.0 - 15.0 g/dL   HCT 39.9 36.0 - 46.0 %   MCV 85.3 78.0 - 100.0 fL   MCH 27.6 26.0 - 34.0 pg   MCHC 32.3 30.0 - 36.0 g/dL   RDW 12.6 11.5 - 15.5 %   Platelets 332 150 - 400 K/uL   Neutrophils Relative % 52 %   Neutro Abs 5.6 1.7 - 7.7 K/uL   Lymphocytes Relative 38 %   Lymphs Abs 4.1 (H) 0.7 - 4.0 K/uL   Monocytes Relative 7 %   Monocytes Absolute 0.7 0.1 - 1.0 K/uL   Eosinophils Relative 2 %   Eosinophils Absolute 0.2 0.0 - 0.7 K/uL   Basophils Relative 1 %   Basophils Absolute 0.1 0.0 - 0.1 K/uL  Comprehensive metabolic panel     Status: Abnormal   Collection Time: 04/09/16  5:30 PM  Result Value Ref Range   Sodium 140 135 - 145 mmol/L   Potassium 4.1 3.5 - 5.1 mmol/L   Chloride 112 (H) 101 - 111 mmol/L   CO2 20 (L) 22 - 32 mmol/L   Glucose, Bld 98 65 - 99 mg/dL   BUN 12 6 - 20 mg/dL   Creatinine, Ser 1.11 (H) 0.44 - 1.00 mg/dL    Calcium 9.7 8.9 - 10.3 mg/dL   Total Protein 7.5 6.5 - 8.1 g/dL   Albumin 3.8 3.5 - 5.0 g/dL   AST 20 15 - 41 U/L   ALT 24 14 - 54 U/L   Alkaline Phosphatase 51 38 - 126 U/L   Total Bilirubin 0.3 0.3 - 1.2 mg/dL   GFR calc non Af Amer >60 >60 mL/min   GFR calc Af Amer >60 >60 mL/min    Comment: (NOTE) The eGFR has been calculated using the CKD EPI equation. This calculation has not been validated in all clinical situations. eGFR's  persistently <60 mL/min signify possible Chronic Kidney Disease.    Anion gap 8 5 - 15  Basic Metabolic Panel (BMET)     Status: None   Collection Time: 05/09/16 12:10 PM  Result Value Ref Range   Sodium 140 135 - 145 mEq/L   Potassium 3.8 3.5 - 5.1 mEq/L   Chloride 111 96 - 112 mEq/L   CO2 22 19 - 32 mEq/L   Glucose, Bld 90 70 - 99 mg/dL   BUN 11 6 - 23 mg/dL   Creatinine, Ser 0.90 0.40 - 1.20 mg/dL   Calcium 9.0 8.4 - 10.5 mg/dL   GFR 83.33 >60.00 mL/min  Lipid panel     Status: None   Collection Time: 05/09/16 12:10 PM  Result Value Ref Range   Cholesterol 145 0 - 200 mg/dL    Comment: ATP III Classification       Desirable:  < 200 mg/dL               Borderline High:  200 - 239 mg/dL          High:  > = 240 mg/dL   Triglycerides 70.0 0.0 - 149.0 mg/dL    Comment: Normal:  <150 mg/dLBorderline High:  150 - 199 mg/dL   HDL 48.90 >39.00 mg/dL   VLDL 14.0 0.0 - 40.0 mg/dL   LDL Cholesterol 82 0 - 99 mg/dL   Total CHOL/HDL Ratio 3     Comment:                Men          Women1/2 Average Risk     3.4          3.3Average Risk          5.0          4.42X Average Risk          9.6          7.13X Average Risk          15.0          11.0                       NonHDL 96.11     Comment: NOTE:  Non-HDL goal should be 30 mg/dL higher than patient's LDL goal (i.e. LDL goal of < 70 mg/dL, would have non-HDL goal of < 100 mg/dL)  Hemoglobin A1c     Status: None   Collection Time: 05/09/16 12:10 PM  Result Value Ref Range   Hgb A1c MFr Bld 5.4 4.6 - 6.5 %     Comment: Glycemic Control Guidelines for People with Diabetes:Non Diabetic:  <6%Goal of Therapy: <7%Additional Action Suggested:  >8%   GC/Chlamydia Probe Amp     Status: None   Collection Time: 05/09/16  3:24 PM  Result Value Ref Range   CT Probe RNA NOT DETECTED     Comment:                    **Normal Reference Range: NOT DETECTED**   This test was performed using the APTIMA COMBO2 Assay ([employer]   The analytical performance characteristics of this assay, when used to test SurePath specimens have been determined by [facility]      GC Probe RNA NOT DETECTED     Comment:                    **Normal Reference  Range: NOT DETECTED**   This test was performed using the APTIMA COMBO2 Assay ([employer]   The analytical performance characteristics of this assay, when used to test SurePath specimens have been determined by [facility]     Hepatitis C antibody     Status: None   Collection Time: 05/09/16  3:24 PM  Result Value Ref Range   HCV Ab NEGATIVE NEGATIVE  Hepatitis B surface antigen     Status: None   Collection Time: 05/09/16  3:24 PM  Result Value Ref Range   Hepatitis B Surface Ag NEGATIVE NEGATIVE  RPR     Status: None   Collection Time: 05/09/16  3:24 PM  Result Value Ref Range   RPR Ser Ql NON REAC NON REAC   Follow up: Return in about 1 year (around 05/09/2017) for CPE.  Wilfred Lacy, NP

## 2016-05-09 NOTE — Progress Notes (Signed)
Normal results, see office note

## 2016-05-09 NOTE — Progress Notes (Signed)
   Patient is a 20 year old who presented to the office today to discuss forms of contraception. Also patient wanted to have an STD screen. Patient denies any discharge. Review of her record indicated that she had a ParaGard T380A IUD placed in 2014 and had been removed. Patient also been treating the past for Chlamydia infection as well as PID. She reports no change in sexual partner. She is overdue for her annual exam.  Exam: Pelvic: Bartholin urethra Skene was within normal limits Vagina: Menstrual blood present Cervix: Menstrual blood present no gross lesions on inspection Bimanual exam not done Adnexa exam not done Rectal exam: Not done  Assessment/plan: Patient requesting STD screen reports no change in sexual partner. Review of her record indicated she had an HIV test one month ago which was negative. We did a GC and Chlamydia culture today and she will stop by the lab for hepatitis B hepatitis C and RPR test to complete the STD screen. We discussed different treatment contraception options to include oral contraceptive pill, Nexplanon, Depo-Provera, NuvaRing and oral contraceptive pill. Literature information was provided all the above and she would like to start the oral contraceptive pill. Patient will be started on avian 28 day oral contraceptive pill. Risks benefits and pros and cons were discussed. Patient is a nonsmoker and denies any family history of any clotting disorders. She will return back next month for overdue annual exam.

## 2016-05-09 NOTE — Assessment & Plan Note (Signed)
conseled about need for weight loss through healthy diet and exercise.

## 2016-05-10 LAB — GC/CHLAMYDIA PROBE AMP
CT PROBE, AMP APTIMA: NOT DETECTED
GC Probe RNA: NOT DETECTED

## 2016-05-10 LAB — RPR

## 2016-06-07 ENCOUNTER — Telehealth: Payer: Self-pay | Admitting: *Deleted

## 2016-06-07 NOTE — Telephone Encounter (Signed)
[facility]apers [facility] for review, signature.

## 2016-06-07 NOTE — Telephone Encounter (Signed)
Spoke with patient and inquired about her job. 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 She is working full time, 8 hours a day. She is on the phone the entire 8 hours. She stated she wants to work part time, 4 hours a day. She stated she was told that her dr would have to complete the accommodation form before any changes could be made. This RN advised her Dr Marjory LiesPenumalli is out of the office this afternoon, returns tomorrow but is off from 06/09/17 - 06/10/16. She stated she must have the form back by 06/10/17. Advised her per office policy we have up to two weeks to complete paperwork, but will try to get this done for her. She verbalized understanding, appreciation.

## 2016-06-09 ENCOUNTER — Telehealth: Payer: Self-pay | Admitting: *Deleted

## 2016-06-09 NOTE — Telephone Encounter (Signed)
Pt form ready @ front desk for pick up.

## 2016-06-09 NOTE — Telephone Encounter (Signed)
Request for accomodation papers completed, signed, sent to MR for processing.

## 2016-07-01 ENCOUNTER — Ambulatory Visit (INDEPENDENT_AMBULATORY_CARE_PROVIDER_SITE_OTHER): Payer: BLUE CROSS/BLUE SHIELD | Admitting: Nurse Practitioner

## 2016-07-01 ENCOUNTER — Encounter: Payer: Self-pay | Admitting: Nurse Practitioner

## 2016-07-01 VITALS — BP 116/80 | HR 77 | Temp 98.2°F | Ht 66.0 in | Wt 270.0 lb

## 2016-07-01 DIAGNOSIS — H6122 Impacted cerumen, left ear: Secondary | ICD-10-CM | POA: Diagnosis not present

## 2016-07-01 NOTE — Progress Notes (Signed)
Pre visit review using our clinic review tool, if applicable. No additional management support is needed unless otherwise documented below in the visit note. 

## 2016-07-01 NOTE — Progress Notes (Signed)
Subjective:  Patient ID: Stacy Mills, female    DOB: 07/31/1995  Age: 21 y.o. MRN: [id number]  CC: Ear Fullness (left ear stop up since yesterday. use peroxide and it started to burn. )   Ear Fullness   There is pain in the left ear. This is a new problem. The current episode started yesterday. The problem occurs constantly. The problem has been unchanged. There has been no fever. The patient is experiencing no pain. Associated symptoms include hearing loss. Pertinent negatives include no coughing, ear discharge, headaches, neck pain, rash, rhinorrhea, sore throat or vomiting. She has tried ear drops for the symptoms. The treatment provided no relief.    Outpatient Medications Prior to Visit  Medication Sig Dispense Refill  . acetaminophen (TYLENOL) 325 MG tablet Take 650 mg by mouth every 6 (six) hours as needed.    Marland Kitchen. acetaZOLAMIDE (DIAMOX) 500 MG capsule Take 1 capsule (500 mg total) by mouth 2 (two) times daily. (Patient taking differently: Take 500 mg by mouth 2 (two) times daily. Pt report taking 1 once a day. per Dr. Van ClinesPenamonlie --[place] [facility]) 180 capsule 4  . ibuprofen (ADVIL,MOTRIN) 200 MG tablet Take 200 mg by mouth every 8 (eight) hours as needed.    Marland Kitchen. levonorgestrel-ethinyl estradiol (AVIANE,ALESSE,LESSINA) 0.1-20 MG-MCG tablet Take 1 tablet by mouth daily. 1 Package 11   No facility-administered medications prior to visit.     ROS See HPI  Objective:  BP 116/80   Pulse 77   Temp 98.2 F (36.8 C)   Ht 5\' 6"  (1.676 m)   Wt 270 lb (122.5 kg)   SpO2 99%   BMI 43.58 kg/m   BP Readings from Last 3 Encounters:  07/01/16 116/80  05/09/16 128/72  05/09/16 114/72    Wt Readings from Last 3 Encounters:  07/01/16 270 lb (122.5 kg)  05/09/16 265 lb (120.2 kg)  04/08/16 263 lb (119.3 kg)    Physical Exam  Constitutional: She is oriented to person, place, and time. No distress.  HENT:  Right Ear: Tympanic membrane, external ear and ear canal normal.  Left Ear:  Tympanic membrane, external ear and ear canal normal.  Nose: Nose normal.  Mouth/Throat: Oropharynx is clear and moist. No oropharyngeal exudate.  Procedure Note :     Procedure :  Ear irrigation (left)  Indication:  Cerumen impaction(left)   Risks, including pain, dizziness, eardrum perforation, bleeding, infection and others as well as benefits were explained to the patient in detail. Verbal consent was obtained and the patient agreed to proceed.    We used "The Elephant Ear Irrigation Device" filled with lukewarm water for irrigation. A large amount wax was recovered. Procedure has also required manual wax removal with an ear loop.   Tolerated well. Complications: None.  Postprocedure instructions :  Call if problems.  Eyes: No scleral icterus.  Neck: Normal range of motion. Neck supple. No thyromegaly present.  Cardiovascular: Normal rate.   Pulmonary/Chest: Effort normal.  Lymphadenopathy:    She has no cervical adenopathy.  Neurological: She is alert and oriented to person, place, and time.  Vitals reviewed.   Lab Results  Component Value Date   WBC 10.7 (H) 04/09/2016   HGB 12.9 04/09/2016   HCT 39.9 04/09/2016   PLT 332 04/09/2016   GLUCOSE 90 05/09/2016   CHOL 145 05/09/2016   TRIG 70.0 05/09/2016   HDL 48.90 05/09/2016   LDLCALC 82 05/09/2016   ALT 24 04/09/2016   AST 20 04/09/2016   NA  140 05/09/2016   K 3.8 05/09/2016   CL 111 05/09/2016   CREATININE 0.90 05/09/2016   BUN 11 05/09/2016   CO2 22 05/09/2016   TSH 0.747 11/03/2014   HGBA1C 5.4 05/09/2016    No results found.  Assessment & Plan:   Stacy Mills was seen today for ear fullness.  Diagnoses and all orders for this visit:  Impacted cerumen of left ear   I am having Stacy Mills maintain her acetaminophen, ibuprofen, acetaZOLAMIDE, and levonorgestrel-ethinyl estradiol.  No orders of the defined types were placed in this encounter.   Follow-up: Return if symptoms worsen or fail to  improve.  Alysia Penna, NP

## 2016-07-11 ENCOUNTER — Ambulatory Visit (INDEPENDENT_AMBULATORY_CARE_PROVIDER_SITE_OTHER): Payer: BLUE CROSS/BLUE SHIELD | Admitting: Diagnostic Neuroimaging

## 2016-07-11 ENCOUNTER — Encounter: Payer: Self-pay | Admitting: Diagnostic Neuroimaging

## 2016-07-11 VITALS — BP 118/81 | HR 75 | Wt 272.0 lb

## 2016-07-11 DIAGNOSIS — G932 Benign intracranial hypertension: Secondary | ICD-10-CM

## 2016-07-11 MED ORDER — ACETAZOLAMIDE ER 500 MG PO CP12: 500.0000 mg | ORAL_CAPSULE | Freq: Every day | ORAL | 4 refills | Status: DC

## 2016-07-11 NOTE — Progress Notes (Signed)
[facility]  PATIENT: Stacy Mills DOB: 12/11/1995  REFERRING CLINICIAN:  HISTORY FROM: patient and mother REASON FOR VISIT: follow up   HISTORICAL  CHIEF COMPLAINT:  Chief Complaint  Patient presents with  . Idoipathic intracranial hypertension    rm 6, mom- Judeth Cornfield, "should have recv'd notes from Dr Alben Spittle ~ 2 wks ago; vision in left eye improving"  . Follow-up    3 month    HISTORY OF PRESENT ILLNESS:   UPDATE 07/11/16: Since last visit, continues with intermittent headaches. Eye exam improving. Tried coming off diamox, then HA worsened, now back on diamox and HA improved. No more transient visual loss. Some intermittent tinnitus (when she is upset).   PRIOR HPI (04/08/16): 21 year old right-handed female here for evaluation of idiopathic intracranial hypertension. 04/04/16 patient noticed a dark spot in her left eye. On 04/05/16 patient went to ophthalmologist and was referred to the emergency room for evaluation. However she left before being seen. Patient returned to emergency room on 04/07/16 for evaluation after seeing ophthalmologist. Patient was diagnosed with bilateral papilledema, had MRI of the brain and orbits, lumbar puncture, and was diagnosed with idiopathic intracranial hypertension (pseudotumor cerebri). Patient was discharged on acetazolamide 500 mg twice a day. She took first dose last night and one doses morning. So far patient is stable. Patient does report history of headaches since age 19 years old. Headaches have been worse in last 1 year. She is frontal severe headaches with photophobia, phonophobia and throbbing sensation. No nausea or vomiting.   REVIEW OF SYSTEMS: Full 14 system review of systems performed and negative with exception of: 100 pound weight gain over 2 years (150 lbs --> 263lbs)  ALLERGIES: Allergies  Allergen Reactions  . Gadolinium Derivatives Itching, Nausea Only and Cough    HOME MEDICATIONS: Outpatient  Medications Prior to Visit  Medication Sig Dispense Refill  . acetaminophen (TYLENOL) 325 MG tablet Take 650 mg by mouth every 6 (six) hours as needed.    Marland Kitchen acetaZOLAMIDE (DIAMOX) 500 MG capsule Take 1 capsule (500 mg total) by mouth 2 (two) times daily. (Patient taking differently: Take 500 mg by mouth 2 (two) times daily. Pt report taking 1 once a day. per Dr. Van Clines --[facility] [facility]) 180 capsule 4  . ibuprofen (ADVIL,MOTRIN) 200 MG tablet Take 200 mg by mouth every 8 (eight) hours as needed.    Marland Kitchen levonorgestrel-ethinyl estradiol (AVIANE,ALESSE,LESSINA) 0.1-20 MG-MCG tablet Take 1 tablet by mouth daily. 1 Package 11   No facility-administered medications prior to visit.     PAST MEDICAL HISTORY: Past Medical History:  Diagnosis Date  . Allergy   . Anxiety   . Depression   . IIH (idiopathic intracranial hypertension)   . IUD (intrauterine device) in place    Paraguard placed 2014  . Migraine   . Pelvic inflammatory disease 10/2012    PAST SURGICAL HISTORY: Past Surgical History:  Procedure Laterality Date  . BRAIN SURGERY    . NO PAST SURGERIES      FAMILY HISTORY: Family History  Problem Relation Age of Onset  . Emphysema Maternal Grandfather   . Depression Maternal Grandfather   . Anxiety disorder Maternal Grandfather   . Mental illness Maternal Grandfather   . Diabetes Maternal Grandfather   . Cancer Paternal Grandmother   . Non-Hodgkin's lymphoma Paternal Grandmother   . Sudden death Paternal Grandmother   . Asthma Mother   . Hypertension Father   . Alcohol abuse Maternal Aunt   . Hyperlipidemia Maternal Aunt  SOCIAL HISTORY:  Social History   Social History  . Marital status: Single    Spouse name: n/a  . Number of children: 0  . Years of education: 12+   Occupational History  . College student    Social History Main Topics  . Smoking status: Never Smoker  . Smokeless tobacco: Never Used  . Alcohol use 1.2 oz/week    2 Shots of liquor per  week     Comment: Monthly  . Drug use: Yes    Types: Other-see comments, Marijuana     Comment: Quit date 02/13/15  . Sexual activity: Not Currently    Partners: Male    Birth control/ protection: Condom   Other Topics Concern  . Not on file   Social History Narrative   Graduated from high school 10/2012, currently attending college.   Right-handed   Caffeine: 3x/wk     PHYSICAL EXAM  GENERAL EXAM/CONSTITUTIONAL: Vitals:  Vitals:   07/11/16 0920  BP: 118/81  Pulse: 75  Weight: 272 lb (123.4 kg)   Wt Readings from Last 3 Encounters:  07/11/16 272 lb (123.4 kg)  07/01/16 270 lb (122.5 kg)  05/09/16 265 lb (120.2 kg)    Body mass index is 43.9 kg/m.  Visual Acuity Screening   Right eye Left eye Both eyes  Without correction:     With correction: 20/30    Comments: 07/11/16 still has blindness area in L eye   Patient is in no distress; well developed, nourished and groomed; neck is supple  CARDIOVASCULAR:  Examination of carotid arteries is normal; no carotid bruits  Regular rate and rhythm, no murmurs  Examination of peripheral vascular system by observation and palpation is normal  EYES:  Ophthalmoscopic exam of optic discs and posterior segments is normal; NO PAPILLEDEMA  MUSCULOSKELETAL:  Gait, strength, tone, movements noted in Neurologic exam below  NEUROLOGIC: MENTAL STATUS:  No flowsheet data found.  awake, alert, oriented to person, place and time  recent and remote memory intact  normal attention and concentration  language fluent, comprehension intact, naming intact,   fund of knowledge appropriate  CRANIAL NERVE:   2nd - NO PAPILLEDEMA  2nd, 3rd, 4th, 6th - pupils equal and reactive to light, visual fields full to confrontation, extraocular muscles intact, no nystagmus  5th - facial sensation symmetric  7th - facial strength symmetric  8th - hearing intact  9th - palate elevates symmetrically, uvula midline  11th -  shoulder shrug symmetric  12th - tongue protrusion midline  MOTOR:   normal bulk and tone, full strength in the BUE, BLE  SENSORY:   normal and symmetric to light touch, temperature, vibration  COORDINATION:   finger-nose-finger, fine finger movements normal  REFLEXES:   deep tendon reflexes TRACE and symmetric  GAIT/STATION:   narrow based gait; able to walk tandem    DIAGNOSTIC DATA (LABS, IMAGING, TESTING) - I reviewed patient records, labs, notes, testing and imaging myself where available.  Lab Results  Component Value Date   WBC 10.7 (H) 04/09/2016   HGB 12.9 04/09/2016   HCT 39.9 04/09/2016   MCV 85.3 04/09/2016   PLT 332 04/09/2016      Component Value Date/Time   NA 140 05/09/2016 1210   K 3.8 05/09/2016 1210   CL 111 05/09/2016 1210   CO2 22 05/09/2016 1210   GLUCOSE 90 05/09/2016 1210   BUN 11 05/09/2016 1210   CREATININE 0.90 05/09/2016 1210   CREATININE 0.93 11/05/2012 1015  CALCIUM 9.0 05/09/2016 1210   PROT 7.5 04/09/2016 1730   ALBUMIN 3.8 04/09/2016 1730   AST 20 04/09/2016 1730   ALT 24 04/09/2016 1730   ALKPHOS 51 04/09/2016 1730   BILITOT 0.3 04/09/2016 1730   GFRNONAA >60 04/09/2016 1730   GFRAA >60 04/09/2016 1730   Lab Results  Component Value Date   CHOL 145 05/09/2016   HDL 48.90 05/09/2016   LDLCALC 82 05/09/2016   TRIG 70.0 05/09/2016   CHOLHDL 3 05/09/2016   Lab Results  Component Value Date   HGBA1C 5.4 05/09/2016   No results found for: [id number] Lab Results  Component Value Date   TSH 0.747 11/03/2014    04/07/16 MRI brain / orbits [I reviewed images myself and agree with interpretation. -[facility]]  - Normal MRI of the brain and orbits. No finding to explain the clinical presentation. No sign of increased intracranial pressure by imaging. No focal brain or orbital finding.  - As discussed above, the patient should be considered to have a mild allergy to MultiHance. I would suggest premedication with Benadryl  should she need MultiHance gadolinium contrast in the future.  04/07/16 lumbar puncture - Successful lumbar puncture using fluoroscopy. Elevated opening pressure 38 cm H2O     ASSESSMENT AND PLAN  21 y.o. year old female here with significant weight gain in the last 2 years, with bilateral papilledema and headaches. Also with enlargement of left eye blind spot. Findings, signs and symptoms consistent with idiopathic intracranial hypertension. Now doing well on diamox 500mg  daily.   Dx:  1. IIH (idiopathic intracranial hypertension)   2. Severe obesity (BMI >= 40) (HCC)      PLAN: - continue acetazolamide 500mg  daily - follow up with ophthalmology for serial eye exams and visual field testing to monitor effectiveness of acetazolamide - reviewed nutrition, fitness and stress mgmt advice with goal of gradual, sustained weight loss - check sleep study (interrupted sleep, obesity, headaches, IIH)  Meds ordered this encounter  Medications  . acetaZOLAMIDE (DIAMOX) 500 MG capsule    Sig: Take 1 capsule (500 mg total) by mouth daily.    Dispense:  90 capsule    Refill:  4   Orders Placed This Encounter  Procedures  . Ambulatory referral to Sleep Studies   Return in about 6 months (around 01/08/2017).    Suanne MarkerVIKRAM R. Matasha Smigelski, MD 07/11/2016, 9:52 AM Certified in Neurology, Neurophysiology and Neuroimaging  [facility] [address], [address] [place], [place] [address] [phone]

## 2016-07-11 NOTE — Patient Instructions (Signed)
Thank you for coming to see [place] at [facility]. I hope we have been able to provide you high quality care today.  You may receive a patient satisfaction survey over the next few weeks. We would appreciate your feedback and comments so that we may continue to improve ourselves and the health of our patients.  - continue diamox 549m daily  - I will setup sleep study consult   ~~~~~~~~~~~~~~~~~~~~~~~~~~~~~~~~~~~~~~~~~~~~~~~~~~~~~~~~~~~~~~~~~  DR. PENUMALLI'S GUIDE TO HAPPY AND HEALTHY LIVING These are some of my general health and wellness recommendations. Some of them may apply to you better than others. Please use common sense as you try these suggestions and feel free to ask me any questions.   ACTIVITY/FITNESS Mental, social, emotional and physical stimulation are very important for brain and body health. Try learning a new activity (arts, music, language, sports, games).  Keep moving your body to the best of your abilities. You can do this at home, inside or outside, the park, community center, gym or anywhere you like. Consider a physical therapist or personal trainer to get started. Consider the app Sworkit. Fitness trackers such as smart-watches, smart-phones or Fitbits can help as well.   NUTRITION Eat more plants: colorful vegetables, nuts, seeds and berries.  Eat less sugar, salt, preservatives and processed foods.  Avoid toxins such as cigarettes and alcohol.  Drink water when you are thirsty. Warm water with a slice of lemon is an excellent morning drink to start the day.  Consider these websites for more information The Nutrition Source (hhttps://www.henry-hernandez.biz/ [employer] Nutrition (wWindowBlog.ch   RELAXATION Consider practicing mindfulness meditation or other relaxation techniques such as deep breathing, prayer, yoga, tai chi, massage. See website mindful.org or the apps Headspace or Calm to help  get started.   SLEEP Try to get at least 7-8+ hours sleep per day. Regular exercise and reduced caffeine will help you sleep better. Practice good sleep hygeine techniques. See website sleep.org for more information.   PLANNING Prepare estate planning, living will, healthcare POA documents. Sometimes this is best planned with the help of an attorney. Theconversationproject.org and agingwithdignity.org are excellent resources.

## 2016-07-14 ENCOUNTER — Encounter: Payer: Self-pay | Admitting: Neurology

## 2016-07-14 ENCOUNTER — Ambulatory Visit (INDEPENDENT_AMBULATORY_CARE_PROVIDER_SITE_OTHER): Payer: BLUE CROSS/BLUE SHIELD | Admitting: Neurology

## 2016-07-14 VITALS — BP 124/82 | HR 72 | Resp 20 | Ht 66.0 in | Wt 270.0 lb

## 2016-07-14 DIAGNOSIS — G932 Benign intracranial hypertension: Secondary | ICD-10-CM

## 2016-07-14 DIAGNOSIS — R51 Headache: Secondary | ICD-10-CM | POA: Diagnosis not present

## 2016-07-14 DIAGNOSIS — R400 Somnolence: Secondary | ICD-10-CM | POA: Diagnosis not present

## 2016-07-14 DIAGNOSIS — R0683 Snoring: Secondary | ICD-10-CM

## 2016-07-14 DIAGNOSIS — R351 Nocturia: Secondary | ICD-10-CM | POA: Diagnosis not present

## 2016-07-14 DIAGNOSIS — H53132 Sudden visual loss, left eye: Secondary | ICD-10-CM

## 2016-07-14 DIAGNOSIS — R519 Headache, unspecified: Secondary | ICD-10-CM

## 2016-07-14 NOTE — Patient Instructions (Signed)

## 2016-07-14 NOTE — Progress Notes (Signed)
Subjective:    Patient ID: Stacy Mills is a 21 y.o. female.  HPI     Huston Foley, MD, PhD [facility] [address], [address] [address] [place], [place] [address]  Dear Satira Sark,   I saw your patient, Stacy Mills, upon your kind request in my clinic today for initial consultation of her sleep disorder, in particular, concern for underlying obstructive sleep apnea. The patient is unaccompanied today. As you know, Ms. Groner a 21 year old right-handed woman with an underlying medical history of pseudotumor cerebri, with associated partial vision loss L eye, history of anxiety, depression, allergies, pelvic inflammatory disease, migraine headaches and morbid obesity, who reports snoring and excessive daytime somnolence. I reviewed your office note from 07/11/2016. Her Epworth sleepiness score is 9 out of 24, her fatigue score is 38 out of 63. She lives at home with her mother, 6 year old sister, maternal aunt and maternal cousin, maternal grandfather. She works at [employer] and also goes to [employer] on Mondays and Wednesdays. Bedtime is around midnight and wake up time around 8 AM. She has occasional morning headaches, nocturia about twice per average night, denies restless leg symptoms. She is not aware of any family history of OSA, mother had a sleep study but does not have a CPAP machine. She quit smoking a year ago, she quit smoking marijuana a year ago. She was an occasional cigarette smoker before that. She likes to drink sweet tea about 2-3 large glasses per day. She shares her bedroom with her aunt and cousin, her aunt snores.    Her Past Medical History Is Significant For: Past Medical History:  Diagnosis Date  . Allergy   . Anxiety   . Depression   . IIH (idiopathic intracranial hypertension)   . IUD (intrauterine device) in place    Paraguard placed 2014  . Migraine   . Pelvic inflammatory disease 10/2012    Her Past Surgical History Is Significant  For: Past Surgical History:  Procedure Laterality Date  . BRAIN SURGERY    . NO PAST SURGERIES      Her Family History Is Significant For: Family History  Problem Relation Age of Onset  . Emphysema Maternal Grandfather   . Depression Maternal Grandfather   . Anxiety disorder Maternal Grandfather   . Mental illness Maternal Grandfather   . Diabetes Maternal Grandfather   . Cancer Paternal Grandmother   . Non-Hodgkin's lymphoma Paternal Grandmother   . Sudden death Paternal Grandmother   . Asthma Mother   . Hypertension Father   . Alcohol abuse Maternal Aunt   . Hyperlipidemia Maternal Aunt     Her Social History Is Significant For: Social History   Social History  . Marital status: Single    Spouse name: n/a  . Number of children: 0  . Years of education: 12+   Occupational History  . College student    Social History Main Topics  . Smoking status: Never Smoker  . Smokeless tobacco: Never Used  . Alcohol use 1.2 oz/week    2 Shots of liquor per week     Comment: Monthly  . Drug use: Yes    Types: Other-see comments, Marijuana     Comment: Quit date 02/13/15  . Sexual activity: Not Currently    Partners: Male    Birth control/ protection: Condom   Other Topics Concern  . None   Social History Narrative   Graduated from high school 10/2012, currently attending college.   Right-handed   Caffeine:  3x/wk    Her Allergies Are:  Allergies  Allergen Reactions  . Gadolinium Derivatives Itching, Nausea Only and Cough  :   Her Current Medications Are:  Outpatient Encounter Prescriptions as of 07/14/2016  Medication Sig  . acetaminophen (TYLENOL) 325 MG tablet Take 650 mg by mouth every 6 (six) hours as needed.  Marland Kitchen. acetaZOLAMIDE (DIAMOX) 500 MG capsule Take 1 capsule (500 mg total) by mouth daily.  Marland Kitchen. ibuprofen (ADVIL,MOTRIN) 200 MG tablet Take 200 mg by mouth every 8 (eight) hours as needed.  Marland Kitchen. levonorgestrel-ethinyl estradiol (AVIANE,ALESSE,LESSINA) 0.1-20 MG-MCG  tablet Take 1 tablet by mouth daily.   No facility-administered encounter medications on file as of 07/14/2016.   :  Review of Systems:  Out of a complete 14 point review of systems, all are reviewed and negative with the exception of these symptoms as listed below: Review of Systems  Neurological:       Pt presents today to discuss her sleep. Pt has never had a sleep study but does endorse occasional snoring.  Epworth Sleepiness Scale 0= would never doze 1= slight chance of dozing 2= moderate chance of dozing 3= high chance of dozing  Sitting and reading: 1 Watching TV: 2 Sitting inactive in a public place (ex. Theater or meeting): 0 As a passenger in a car for an hour without a break: 3 Lying down to rest in the afternoon: 2 Sitting and talking to someone: 0 Sitting quietly after lunch (no alcohol): 1 In a car, while stopped in traffic: 0 Total: 9     Objective:  Neurologic Exam  Physical Exam Physical Examination:   Vitals:   07/14/16 1526  BP: 124/82  Pulse: 72  Resp: 20    General Examination: The patient is a very pleasant 21 y.o. female in no acute distress. She appears well-developed and well-nourished and well groomed. She is morbidly obese.  HEENT: Normocephalic, atraumatic, pupils are equal, round and reactive to light and accommodation. Funduscopic exam is normal with sharp disc margins noted. She has mild decrease in vision in the temporal VF of the left eye Extraocular tracking is good without limitation to gaze excursion or nystagmus noted. Normal smooth pursuit is noted. Hearing is grossly intact. Face is symmetric with normal facial animation and normal facial sensation. Speech is clear with no dysarthria noted. There is no hypophonia. There is no lip, neck/head, jaw or voice tremor. Neck is supple with full range of passive and active motion. There are no carotid bruits on auscultation. Oropharynx exam reveals: mild mouth dryness, good dental hygiene and  moderate airway crowding, due to smaller airway entry, larger uvula, thicker tongue and tonsils of 2+ bilaterally. Mallampati is class II. Tongue protrudes centrally and palate elevates symmetrically. Neck size is 15 7/8 inches. She has a Mild overbite.    Chest: Clear to auscultation without wheezing, rhonchi or crackles noted.  Heart: S1+S2+0, regular and normal without murmurs, rubs or gallops noted.   Abdomen: Soft, non-tender and non-distended with normal bowel sounds appreciated on auscultation.  Extremities: There is no pitting edema in the distal lower extremities bilaterally. Pedal pulses are intact.  Skin: Warm and dry without trophic changes noted.  Musculoskeletal: exam reveals no obvious joint deformities, tenderness or joint swelling or erythema.   Neurologically:  Mental status: The patient is awake, alert and oriented in all 4 spheres. Her immediate and remote memory, attention, language skills and fund of knowledge are appropriate. There is no evidence of aphasia, agnosia, apraxia or anomia.  Speech is clear with normal prosody and enunciation. Thought process is linear. Mood is normal and affect is normal.  Cranial nerves II - XII are as described above under HEENT exam. In addition: shoulder shrug is normal with equal shoulder height noted. Motor exam: Normal bulk, strength and tone is noted. There is no drift, tremor or rebound. Romberg is negative. Reflexes are 2+ throughout. Babinski: Toes are flexor bilaterally. Fine motor skills and coordination: intact with normal finger taps, normal hand movements, normal rapid alternating patting, normal foot taps and normal foot agility.  Cerebellar testing: No dysmetria or intention tremor on finger to nose testing. Heel to shin is unremarkable bilaterally. There is no truncal or gait ataxia.  Sensory exam: intact to light touch, vibration, and temperature in the upper and lower extremities.  Gait, station and balance: She stands  easily. No veering to one side is noted. No leaning to one side is noted. Posture is age-appropriate and stance is narrow based. Gait shows normal stride length and normal pace. No problems turning are noted. Tandem walk is unremarkable.  Assessment and Plan:   In summary, Derek Huneycutt is a very pleasant 21 y.o.-year old female with an underlying medical history of pseudotumor cerebri, with associated partial vision loss L eye, history of anxiety, depression, allergies, pelvic inflammatory disease, migraine headaches and morbid obesity, whose history and physical exam are concerning for obstructive sleep apnea (OSA). She reports morning headaches, nocturia, daytime tiredness and in light of her history of pseudotumor cerebri and morbid obesity, with a moderately crowded airway, sleep apnea should be ruled out and treated if present. I had a long chat with the patient about my findings and the diagnosis of OSA, its prognosis and treatment options. We talked about medical treatments, surgical interventions and non-pharmacological approaches. I explained in particular the risks and ramifications of untreated moderate to severe OSA, especially with respect to developing cardiovascular disease down the Road, including congestive heart failure, difficult to treat hypertension, cardiac arrhythmias, or stroke. Even type 2 diabetes has, in part, been linked to untreated OSA. Symptoms of untreated OSA include daytime sleepiness, memory problems, mood irritability and mood disorder such as depression and anxiety, lack of energy, as well as recurrent headaches, especially morning headaches. We talked about trying to maintain a healthy lifestyle in general, as well as the importance of weight control. I encouraged the patient to eat healthy, exercise daily and keep well hydrated, to keep a scheduled bedtime and wake time routine, to not skip any meals and eat healthy snacks in between meals. I advised the patient not to  drive when feeling sleepy. I recommended the following at this time: sleep study with potential positive airway pressure titration. (We will score hypopneas at 3%).   I explained the sleep test procedure to the patient and also outlined possible surgical and non-surgical treatment options of OSA, including the use of a custom-made dental device (which would require a referral to a specialist dentist or oral surgeon), upper airway surgical options, such as pillar implants, radiofrequency surgery, tongue base surgery, and UPPP (which would involve a referral to an ENT surgeon). Rarely, jaw surgery such as mandibular advancement may be considered.  I also explained the CPAP treatment option to the patient, who indicated that she would be willing to try CPAP if the need arises. I explained the importance of being compliant with PAP treatment, not only for insurance purposes but primarily to improve Her symptoms, and for the patient's long term health  benefit, including to reduce Her cardiovascular risks. I answered all her questions today and the patient was in agreement. I would like to see her back after the sleep study is completed and encouraged her to call with any interim questions, concerns, problems or updates.   Thank you very much for allowing me to participate in the care of this nice patient. If I can be of any further assistance to you please do not hesitate to talk to me.  Sincerely,   Huston Foley, MD, PhD

## 2016-08-05 ENCOUNTER — Ambulatory Visit (INDEPENDENT_AMBULATORY_CARE_PROVIDER_SITE_OTHER): Payer: BLUE CROSS/BLUE SHIELD | Admitting: Neurology

## 2016-08-05 DIAGNOSIS — R0683 Snoring: Secondary | ICD-10-CM

## 2016-08-05 DIAGNOSIS — G4720 Circadian rhythm sleep disorder, unspecified type: Secondary | ICD-10-CM

## 2016-08-05 DIAGNOSIS — G4761 Periodic limb movement disorder: Secondary | ICD-10-CM

## 2016-08-09 NOTE — Procedures (Signed)
PATIENT'S NAME:  Stacy Mills, Stacy Mills DOB:      04-07-1996      MR#:    [id number]     DATE OF RECORDING: 08/05/2016 REFERRING M.D.:  Anne Ng, NP Study Performed:   Baseline Polysomnogram HISTORY: 21 year old woman with a underlying medical history of pseudotumor cerebri, with associated partial vision loss L eye, history of anxiety, depression, allergies, pelvic inflammatory disease, migraine headaches and morbid obesity, who reports snoring and excessive daytime somnolence. The patient endorsed the Epworth Sleepiness Scale at 9/24 points. The patient's weight 270 pounds with a height of 66 (inches), resulting in a BMI of 43.2 kg/m2. The patient's neck circumference measured 16 inches.  CURRENT MEDICATIONS: Tylenol, Diamox, Motrin, Aleese   PROCEDURE:  This is a multichannel digital polysomnogram utilizing the Somnostar 11.2 system.  Electrodes and sensors were applied and monitored per AASM Specifications.   EEG, EOG, Chin and Limb EMG, were sampled at 200 Hz.  ECG, Snore and Nasal Pressure, Thermal Airflow, Respiratory Effort, CPAP Flow and Pressure, Oximetry was sampled at 50 Hz. Digital video and audio were recorded.      BASELINE STUDY  Lights Out was at 21:05 and Lights On at 05:01.  Total recording time (TRT) was 476 minutes, with a total sleep time (TST) of  316 minutes.   The patient's sleep latency was 152 minutes, which is markedly prolonged.  REM latency was 54.5 minutes, which is mildly reduced.  The sleep efficiency was 66.4 %, which is reduced.     SLEEP ARCHITECTURE: WASO (Wake after sleep onset) was 18 minutes with mild sleep fragmentation noted.  There were 4.5 minutes in Stage N1, 195.5 minutes Stage N2, 67.5 minutes Stage N3 and 48.5 minutes in Stage REM.  The percentage of Stage N1 was 1.4%, Stage N2 was 61.9%, which is increased, Stage N3 was 21.4%, which is normal and Stage R (REM sleep) was 15.3%, which is mildly decreased.   The arousals were noted as: 26 were  spontaneous, 25 were associated with PLMs, 6 were associated with respiratory events.    Audio and video analysis did not show any abnormal or unusual movements, behaviors, phonations or vocalizations.   The patient took 1 bathroom break. Intermittent mild to rare moderate snoring was noted. The EKG was in keeping with normal sinus rhythm (NSR).  RESPIRATORY ANALYSIS:  There were a total of 7 respiratory events:  0 obstructive apneas, 5 central apneas and 1 mixed apneas with a total of 6 apneas and an apnea index (AI) of 1.1 /hour. There were 1 hypopneas with a hypopnea index of .2 /hour. The patient also had 0 respiratory event related arousals (RERAs).      The total APNEA/HYPOPNEA INDEX (AHI) was 1.3/hour and the total RESPIRATORY DISTURBANCE INDEX was 1.3 /hour.  0 events occurred in REM sleep and 8 events in NREM. The REM AHI was 0 /hour, versus a non-REM AHI of 1.6. The patient spent 7.5 minutes of total sleep time in the supine position and 309 minutes in non-supine.. The supine AHI was 0.0 versus a non-supine AHI of 1.4.  OXYGEN SATURATION & C02:  The Wake baseline 02 saturation was 96%, with the lowest being 89%. Time spent below 89% saturation equaled 0 minutes.  PERIODIC LIMB MOVEMENTS:   The patient had a total of 107 Periodic Limb Movements.  The Periodic Limb Movement (PLM) index was 20.3 and the PLM Arousal index was 4.7/hour.  Post-study, the patient indicated that sleep was worse than usual.  IMPRESSION:  1. Periodic Limb Movement Disorder (PLMD) 2. Primary Snoring 3. Dysfunctions associated with sleep stages or arousal from sleep  RECOMMENDATIONS:  1. This study does not demonstrate any significant obstructive or central sleep disordered breathing, except for intermittent snoring. For this, avoidance of the supine sleep position and weight loss are recommended. This study does not support an intrinsic sleep disorder as a cause of the patient's symptoms. Other causes,  including circadian rhythm disturbances, an underlying mood disorder, medication effect and/or an underlying medical problem cannot be ruled out. 2. Mild PLMs (periodic limb movements of sleep) were noted during the study without significant arousals; clinical correlation is recommended. Patient denies a history of restless legs syndrome and can be monitored in that regard. 3. This study shows sleep fragmentation and abnormal sleep stage percentages; these are nonspecific findings and per se do not signify an intrinsic sleep disorder or a cause for the patient's sleep-related symptoms. Causes include (but are not limited to) the first night effect of the sleep study, circadian rhythm disturbances, medication effect or an underlying mood disorder or medical problem.  4. The patient should be cautioned not to drive, work at heights, or operate dangerous or heavy equipment when tired or sleepy. Review and reiteration of good sleep hygiene measures should be pursued with any patient. 5. The patient can follow-up with her referring provider, who will be notified of the test results.  I certify that I have reviewed the entire raw data recording prior to the issuance of this report in accordance with the Standards of Accreditation of the American Academy of Sleep Medicine (AASM).    Huston FoleySaima Kazimir Hartnett, MD, PhD Diplomat, American Board of Psychiatry and Neurology (Neurology and Sleep Medicine)

## 2016-08-09 NOTE — Progress Notes (Signed)
Patient referred by Dr. Marjory LiesPenumalli, seen by me on 07/14/16, diagnostic PSG on 08/05/16.   Please call and notify the patient that the recent sleep study did not show any significant obstructive sleep apnea or significant sleep d/o. Please inform patient that she can FU with Dr. Marjory LiesPenumalli as planned.  Please remind patient to try to maintain good sleep hygiene, which means: Keep a regular sleep and wake schedule, try not to exercise or have a meal within 2 hours of your bedtime, try to keep your bedroom conducive for sleep, that is, cool and dark, without light distractors such as an illuminated alarm clock, and refrain from watching TV right before sleep or in the middle of the night and do not keep the TV or radio on during the night. Also, try not to use or play on electronic devices at bedtime, such as your cell phone, tablet PC or laptop. If you like to read at bedtime on an electronic device, try to dim the background light as much as possible. Do not eat in the middle of the night.  Please route or fax report to PCP and referring MD, if other than PCP.  Once you have spoken to patient, you can close this encounter.   Thanks,  Huston FoleySaima Wileen Duncanson, MD, PhD [facility])

## 2016-08-11 ENCOUNTER — Telehealth: Payer: Self-pay

## 2016-08-11 NOTE — Telephone Encounter (Signed)
LM with results and recommendations below. Left call back number for further questions. I have mailed a good sleep hygiene pamphlet to the patient.

## 2016-08-11 NOTE — Telephone Encounter (Signed)
-----   Message from Huston FoleySaima Athar, MD sent at 08/09/2016  5:30 PM EDT ----- Patient referred by Dr. Marjory LiesPenumalli, seen by me on 07/14/16, diagnostic PSG on 08/05/16.   Please call and notify the patient that the recent sleep study did not show any significant obstructive sleep apnea or significant sleep d/o. Please inform patient that she can FU with Dr. Marjory LiesPenumalli as planned.  Please remind patient to try to maintain good sleep hygiene, which means: Keep a regular sleep and wake schedule, try not to exercise or have a meal within 2 hours of your bedtime, try to keep your bedroom conducive for sleep, that is, cool and dark, without light distractors such as an illuminated alarm clock, and refrain from watching TV right before sleep or in the middle of the night and do not keep the TV or radio on during the night. Also, try not to use or play on electronic devices at bedtime, such as your cell phone, tablet PC or laptop. If you like to read at bedtime on an electronic device, try to dim the background light as much as possible. Do not eat in the middle of the night.  Please route or fax report to PCP and referring MD, if other than PCP.  Once you have spoken to patient, you can close this encounter.   Thanks,  Huston FoleySaima Athar, MD, PhD [facility])

## 2016-09-28 ENCOUNTER — Encounter: Payer: Self-pay | Admitting: Women's Health

## 2016-09-28 ENCOUNTER — Ambulatory Visit (INDEPENDENT_AMBULATORY_CARE_PROVIDER_SITE_OTHER): Payer: BLUE CROSS/BLUE SHIELD | Admitting: Women's Health

## 2016-09-28 VITALS — BP 124/80

## 2016-09-28 DIAGNOSIS — N912 Amenorrhea, unspecified: Secondary | ICD-10-CM

## 2016-09-28 LAB — PREGNANCY, URINE: PREG TEST UR: NEGATIVE

## 2016-09-28 NOTE — Progress Notes (Signed)
Presents requesting pregnancy test, states has had some nausea, aversion to certain smells and thinks she may be pregnant. Was seen by Dr. Lily Peer in December 2017 was prescribed OC's which she did not start, forgot. Did have a negative STD screen same partner. Reports normal monthly cycles until April. LMP approximately March 20 normal cycle, had a light 3 day spotting cycle middle of April which was unusual for her. Denies any abdominal pain, vaginal discharge, urinary symptoms, or fever. Pregnancy okay, condoms occasionally.  Exam: Appears well. Obese. UPT negative.  Light cycle x 1 mo  Plan: Reviewed UPT negative, reviewed most likely a light cycle in April, best to watch at this time, requested blood pregnancy test. We'll check hCG qualitative. Instructed to schedule annual exam. Contraception options reviewed declines at this time. Mirena IUD information given, if decides to proceed will schedule Mirena IUD with Dr. Lily Peer with cycle, instructed to call office to check benefits prior to having inserted. Reviewed slight risk for infection, perforation or hemorrhage.

## 2016-09-28 NOTE — Patient Instructions (Signed)

## 2016-09-29 ENCOUNTER — Encounter: Payer: Self-pay | Admitting: Women's Health

## 2016-09-29 LAB — HCG, SERUM, QUALITATIVE: Preg, Serum: NEGATIVE

## 2016-10-12 ENCOUNTER — Encounter: Payer: Self-pay | Admitting: Gynecology

## 2016-11-04 ENCOUNTER — Encounter: Payer: BLUE CROSS/BLUE SHIELD | Admitting: Gynecology

## 2017-01-09 ENCOUNTER — Ambulatory Visit: Payer: BLUE CROSS/BLUE SHIELD | Admitting: Diagnostic Neuroimaging

## 2017-01-09 ENCOUNTER — Telehealth: Payer: Self-pay | Admitting: Diagnostic Neuroimaging

## 2017-01-09 NOTE — Telephone Encounter (Signed)
Rn spoke with patient that Dr. Denyse AmassPenuamalli has no availability. Rn gave patient appt in 01/2017. Pt was on her way from [place] and will not get her by the appt time. Appt schedule.

## 2017-01-09 NOTE — Telephone Encounter (Signed)
Patient called office in reference to her appointment today with Dr. Marjory LiesPenumalli at 11:30am.  She is on the way back from [place] and GPS says she wont be back until 12ish.  She would like to know if we are able to work her in later today please?

## 2017-01-10 ENCOUNTER — Encounter: Payer: Self-pay | Admitting: Diagnostic Neuroimaging

## 2017-01-14 ENCOUNTER — Encounter: Payer: Self-pay | Admitting: Women's Health

## 2017-01-17 ENCOUNTER — Encounter: Payer: Self-pay | Admitting: Nurse Practitioner

## 2017-01-31 ENCOUNTER — Ambulatory Visit (INDEPENDENT_AMBULATORY_CARE_PROVIDER_SITE_OTHER): Payer: BLUE CROSS/BLUE SHIELD | Admitting: Diagnostic Neuroimaging

## 2017-01-31 ENCOUNTER — Encounter: Payer: Self-pay | Admitting: Diagnostic Neuroimaging

## 2017-01-31 VITALS — BP 125/83 | HR 81 | Ht 66.0 in | Wt 283.4 lb

## 2017-01-31 DIAGNOSIS — G932 Benign intracranial hypertension: Secondary | ICD-10-CM

## 2017-01-31 DIAGNOSIS — R0683 Snoring: Secondary | ICD-10-CM | POA: Diagnosis not present

## 2017-01-31 MED ORDER — ACETAZOLAMIDE ER 500 MG PO CP12: 500.0000 mg | ORAL_CAPSULE | Freq: Every day | ORAL | 4 refills | Status: DC

## 2017-01-31 NOTE — Patient Instructions (Signed)
Thank you for coming to see [place] at [facility]. I hope we have been able to provide you high quality care today.  You may receive a patient satisfaction survey over the next few weeks. We would appreciate your feedback and comments so that we may continue to improve ourselves and the health of our patients.  - continue acetazolamide 554m daily  - follow up with ophthalmology for serial eye exams and visual field testing to monitor effectiveness of acetazolamide  - reviewed nutrition, fitness and stress mgmt advice with goal of gradual, sustained weight loss; will refer to Dr. [facility]   ~~~~~~~~~~~~~~~~~~~~~~~~~~~~~~~~~~~~~~~~~~~~~~~~~~~~~~~~~~~~~~~~~  DR. Francys Bolin'S GUIDE TO HAPPY AND HEALTHY LIVING These are some of my general health and wellness recommendations. Some of them may apply to you better than others. Please use common sense as you try these suggestions and feel free to ask me any questions.   ACTIVITY/FITNESS Mental, social, emotional and physical stimulation are very important for brain and body health. Try learning a new activity (arts, music, language, sports, games).  Keep moving your body to the best of your abilities. You can do this at home, inside or outside, the park, community center, gym or anywhere you like. Consider a physical therapist or personal trainer to get started. Consider the app Sworkit. Fitness trackers such as smart-watches, smart-phones or Fitbits can help as well.   NUTRITION Eat more plants: colorful vegetables, nuts, seeds and berries.  Eat less sugar, salt, preservatives and processed foods.  Avoid toxins such as cigarettes and alcohol.  Drink water when you are thirsty. Warm water with a slice of lemon is an excellent morning drink to start the day.  Consider these websites for more information The Nutrition Source (hhttps://www.henry-hernandez.biz/ [employer] Nutrition  (wWindowBlog.ch   RELAXATION Consider practicing mindfulness meditation or other relaxation techniques such as deep breathing, prayer, yoga, tai chi, massage. See website mindful.org or the apps Headspace or Calm to help get started.   SLEEP Try to get at least 7-8+ hours sleep per day. Regular exercise and reduced caffeine will help you sleep better. Practice good sleep hygeine techniques. See website sleep.org for more information.   PLANNING Prepare estate planning, living will, healthcare POA documents. Sometimes this is best planned with the help of an attorney. Theconversationproject.org and agingwithdignity.org are excellent resources.

## 2017-01-31 NOTE — Progress Notes (Signed)
[facility]  PATIENT: Stacy Mills DOB: 12-21-1995  REFERRING CLINICIAN:  HISTORY FROM: patient REASON FOR VISIT: follow up   HISTORICAL  CHIEF COMPLAINT:  Chief Complaint  Patient presents with  . Follow-up  . IIH    doing well.  No headaches other then around menstrual cycle.    HISTORY OF PRESENT ILLNESS:   UPDATE 01/31/17: Since last visit, doing well. Tolerating acetazolamide. Vision and HA are stable. No HA. Eye exam per patient was stable. No other concerns today. Weight is slightly increasing. She is planning to try a new nutrition plan.   UPDATE 07/11/16: Since last visit, continues with intermittent headaches. Eye exam improving. Tried coming off diamox, then HA worsened, now back on diamox and HA improved. No more transient visual loss. Some intermittent tinnitus (when she is upset).   PRIOR HPI (04/08/16): 21 year old right-handed female here for evaluation of idiopathic intracranial hypertension. 04/04/16 patient noticed a dark spot in her left eye. On 04/05/16 patient went to ophthalmologist and was referred to the emergency room for evaluation. However she left before being seen. Patient returned to emergency room on 04/07/16 for evaluation after seeing ophthalmologist. Patient was diagnosed with bilateral papilledema, had MRI of the brain and orbits, lumbar puncture, and was diagnosed with idiopathic intracranial hypertension (pseudotumor cerebri). Patient was discharged on acetazolamide 500 mg twice a day. She took first dose last night and one doses morning. So far patient is stable. Patient does report history of headaches since age 36 years old. Headaches have been worse in last 1 year. She is frontal severe headaches with photophobia, phonophobia and throbbing sensation. No nausea or vomiting.   REVIEW OF SYSTEMS: Full 14 system review of systems performed and negative with exception of: 100 pound weight gain over 2 years (150 lbs -->  263lbs)  ALLERGIES: Allergies  Allergen Reactions  . Gadolinium Derivatives Itching, Nausea Only and Cough    HOME MEDICATIONS: Outpatient Medications Prior to Visit  Medication Sig Dispense Refill  . acetaminophen (TYLENOL) 325 MG tablet Take 650 mg by mouth every 6 (six) hours as needed.    Marland Kitchen acetaZOLAMIDE (DIAMOX) 500 MG capsule Take 1 capsule (500 mg total) by mouth daily. 90 capsule 4  . ibuprofen (ADVIL,MOTRIN) 200 MG tablet Take 200 mg by mouth every 8 (eight) hours as needed.     No facility-administered medications prior to visit.     PAST MEDICAL HISTORY: Past Medical History:  Diagnosis Date  . Allergy   . Anxiety   . Depression   . IIH (idiopathic intracranial hypertension)   . IUD (intrauterine device) in place    Paraguard placed 2014  . Migraine   . Pelvic inflammatory disease 10/2012    PAST SURGICAL HISTORY: Past Surgical History:  Procedure Laterality Date  . BRAIN SURGERY    . NO PAST SURGERIES      FAMILY HISTORY: Family History  Problem Relation Age of Onset  . Emphysema Maternal Grandfather   . Depression Maternal Grandfather   . Anxiety disorder Maternal Grandfather   . Mental illness Maternal Grandfather   . Diabetes Maternal Grandfather   . Cancer Paternal Grandmother   . Non-Hodgkin's lymphoma Paternal Grandmother   . Sudden death Paternal Grandmother   . Asthma Mother   . Hypertension Father   . Alcohol abuse Maternal Aunt   . Hyperlipidemia Maternal Aunt     SOCIAL HISTORY:  Social History   Social History  . Marital status: Single    Spouse name:  n/a  . Number of children: 0  . Years of education: 12+   Occupational History  . College student    Social History Main Topics  . Smoking status: Never Smoker  . Smokeless tobacco: Never Used  . Alcohol use 1.2 oz/week    2 Shots of liquor per week     Comment: social  . Drug use: Yes    Types: Other-see comments, Marijuana     Comment: Quit date 02/13/15  . Sexual  activity: Not Currently    Partners: Male    Birth control/ protection: Condom   Other Topics Concern  . Not on file   Social History Narrative   Graduated from high school 10/2012, currently attending college.   Right-handed   Caffeine: 3x/wk     PHYSICAL EXAM  GENERAL EXAM/CONSTITUTIONAL: Vitals:  Vitals:   01/31/17 0939  BP: 125/83  Pulse: 81  Weight: 283 lb 6.4 oz (128.5 kg)  Height: 5\' 6"  (1.676 m)   Wt Readings from Last 3 Encounters:  01/31/17 283 lb 6.4 oz (128.5 kg)  07/14/16 270 lb (122.5 kg)  07/11/16 272 lb (123.4 kg)    Body mass index is 45.74 kg/m.  Visual Acuity Screening   Right eye Left eye Both eyes  Without correction:     With correction: 20/30 20/40     Patient is in no distress; well developed, nourished and groomed; neck is supple  CARDIOVASCULAR:  Examination of carotid arteries is normal; no carotid bruits  Regular rate and rhythm, no murmurs  Examination of peripheral vascular system by observation and palpation is normal  EYES:  Ophthalmoscopic exam of optic discs and posterior segments is normal; NO PAPILLEDEMA  MUSCULOSKELETAL:  Gait, strength, tone, movements noted in Neurologic exam below  NEUROLOGIC: MENTAL STATUS:  No flowsheet data found.  awake, alert, oriented to person, place and time  recent and remote memory intact  normal attention and concentration  language fluent, comprehension intact, naming intact,   fund of knowledge appropriate  CRANIAL NERVE:   2nd - NO PAPILLEDEMA  2nd, 3rd, 4th, 6th - pupils equal and reactive to light, visual fields full to confrontation, extraocular muscles intact, no nystagmus  5th - facial sensation symmetric  7th - facial strength symmetric  8th - hearing intact  9th - palate elevates symmetrically, uvula midline  11th - shoulder shrug symmetric  12th - tongue protrusion midline  MOTOR:   normal bulk and tone, full strength in the BUE, BLE  SENSORY:    normal and symmetric to light touch, temperature, vibration  COORDINATION:   finger-nose-finger, fine finger movements normal  REFLEXES:   deep tendon reflexes TRACE and symmetric  GAIT/STATION:   narrow based gait; able to walk tandem    DIAGNOSTIC DATA (LABS, IMAGING, TESTING) - I reviewed patient records, labs, notes, testing and imaging myself where available.  Lab Results  Component Value Date   WBC 10.7 (H) 04/09/2016   HGB 12.9 04/09/2016   HCT 39.9 04/09/2016   MCV 85.3 04/09/2016   PLT 332 04/09/2016      Component Value Date/Time   NA 140 05/09/2016 1210   K 3.8 05/09/2016 1210   CL 111 05/09/2016 1210   CO2 22 05/09/2016 1210   GLUCOSE 90 05/09/2016 1210   BUN 11 05/09/2016 1210   CREATININE 0.90 05/09/2016 1210   CREATININE 0.93 11/05/2012 1015   CALCIUM 9.0 05/09/2016 1210   PROT 7.5 04/09/2016 1730   ALBUMIN 3.8 04/09/2016 1730  AST 20 04/09/2016 1730   ALT 24 04/09/2016 1730   ALKPHOS 51 04/09/2016 1730   BILITOT 0.3 04/09/2016 1730   GFRNONAA >60 04/09/2016 1730   GFRAA >60 04/09/2016 1730   Lab Results  Component Value Date   CHOL 145 05/09/2016   HDL 48.90 05/09/2016   LDLCALC 82 05/09/2016   TRIG 70.0 05/09/2016   CHOLHDL 3 05/09/2016   Lab Results  Component Value Date   HGBA1C 5.4 05/09/2016   No results found for: VITAMINB12 Lab Results  Component Value Date   TSH 0.747 11/03/2014    04/07/16 MRI brain / orbits [I reviewed images myself and agree with interpretation. -VRP]  - Normal MRI of the brain and orbits. No finding to explain the clinical presentation. No sign of increased intracranial pressure by imaging. No focal brain or orbital finding.  - As discussed above, the patient should be considered to have a mild allergy to MultiHance. I would suggest premedication with Benadryl should she need MultiHance gadolinium contrast in the future.  04/07/16 lumbar puncture - Successful lumbar puncture using fluoroscopy.  Elevated opening pressure 38 cm H2O  08/05/16 PSG 1. Periodic Limb Movement Disorder (PLMD) 2. Primary Snoring 3. Dysfunctions associated with sleep stages or arousal from sleep 4. This study does not demonstrate any significant obstructive or central sleep disordered breathing, except for intermittent snoring.     ASSESSMENT AND PLAN  21 y.o. year old female here with significant weight gain in the last 2 years, with bilateral papilledema and headaches. Also with enlargement of left eye blind spot. Findings, signs and symptoms consistent with idiopathic intracranial hypertension. Now doing well on diamox 500mg  daily.   Dx:  1. IIH (idiopathic intracranial hypertension)   2. Snoring   3. Severe obesity (BMI >= 40) (HCC)      PLAN:  I spent 15 minutes of face to face time with patient. Greater than 50% of time was spent in counseling and coordination of care with patient. In summary we discussed:   - continue acetazolamide 500mg  daily - follow up with ophthalmology for serial eye exams and visual field testing to monitor effectiveness of acetazolamide - reviewed nutrition, fitness and stress mgmt advice with goal of gradual, sustained weight loss; will refer to Dr. Dalbert Garnet for medical weight mgmt clinic  Meds ordered this encounter  Medications  . acetaZOLAMIDE (DIAMOX) 500 MG capsule    Sig: Take 1 capsule (500 mg total) by mouth daily.    Dispense:  90 capsule    Refill:  4   Orders Placed This Encounter  Procedures  . Ambulatory referral to [facility]   Return in about 6 months (around 07/31/2017).    Suanne Marker, MD 01/31/2017, 9:57 AM Certified in Neurology, Neurophysiology and Neuroimaging  [facility] [address] [place], [place] [address] [phone]

## 2017-02-04 ENCOUNTER — Ambulatory Visit (INDEPENDENT_AMBULATORY_CARE_PROVIDER_SITE_OTHER): Payer: BLUE CROSS/BLUE SHIELD | Admitting: Physician Assistant

## 2017-02-04 VITALS — BP 131/84 | HR 85 | Temp 98.2°F | Resp 16 | Wt 278.0 lb

## 2017-02-04 DIAGNOSIS — Z202 Contact with and (suspected) exposure to infections with a predominantly sexual mode of transmission: Secondary | ICD-10-CM

## 2017-02-04 DIAGNOSIS — N898 Other specified noninflammatory disorders of vagina: Secondary | ICD-10-CM | POA: Diagnosis not present

## 2017-02-04 DIAGNOSIS — Z23 Encounter for immunization: Secondary | ICD-10-CM | POA: Diagnosis not present

## 2017-02-04 LAB — POCT URINALYSIS DIP (MANUAL ENTRY)
BILIRUBIN UA: NEGATIVE
GLUCOSE UA: NEGATIVE mg/dL
Nitrite, UA: NEGATIVE
Protein Ur, POC: NEGATIVE mg/dL
RBC UA: NEGATIVE
Spec Grav, UA: >=1.03 — AB (ref 1.010–1.025)
Urobilinogen, UA: 0.2 E.U./dL
pH, UA: 6 (ref 5.0–8.0)

## 2017-02-04 LAB — POCT WET + KOH PREP
TRICH BY WET PREP: ABSENT
YEAST BY WET PREP: ABSENT
Yeast by KOH: ABSENT

## 2017-02-04 LAB — POCT URINE PREGNANCY: PREG TEST UR: NEGATIVE

## 2017-02-04 MED ORDER — AZITHROMYCIN 500 MG PO TABS: 1000.0000 mg | ORAL_TABLET | Freq: Every day | ORAL | 0 refills | Status: DC

## 2017-02-04 NOTE — Progress Notes (Signed)
PRIMARY CARE AT [facility] [address] [place] [address] [phone]  Date:  02/04/2017   Name:  Stacy Mills   DOB:  [birth year]   MRN:  [id number]  PCP:  Anne Ng, NP    History of Present Illness:  Stacy Mills is a 21 y.o. female patient who presents to PCP with  Chief Complaint  Patient presents with  . Exposure to STD    2 days ago      Unprotected sex with this individual 1 month ago.  She has noticed increased vaginal discharge at this time.  No vaginal pruritus.  There is odor described as fishy.  No dysuria, hematuria, no abnormal frequency.  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  No abnormal vaginal bleeding or fever.  No ur symptoms or neck pain.  She was told that this former partner had chlamydia.  Patient Active Problem List   Diagnosis Date Noted  . Idiopathic intracranial hypertension   . Migraines 12/22/2014  . IUD (intrauterine device) in place 01/10/2013  . PID (acute pelvic inflammatory disease) 11/07/2012  . Hemorrhagic cyst of ovary 11/07/2012  . Severe obesity (BMI >= 40) (HCC) 08/26/2012    Past Medical History:  Diagnosis Date  . Allergy   . Anxiety   . Depression   . IIH (idiopathic intracranial hypertension)   . IUD (intrauterine device) in place    Paraguard placed 2014  . Migraine   . Pelvic inflammatory disease 10/2012    Past Surgical History:  Procedure Laterality Date  . BRAIN SURGERY    . NO PAST SURGERIES      Social History  Substance Use Topics  . Smoking status: Never Smoker  . Smokeless tobacco: Never Used  . Alcohol use 1.2 oz/week    2 Shots of liquor per week     Comment: social    Family History  Problem Relation Age of Onset  . Emphysema Maternal Grandfather   . Depression Maternal Grandfather   . Anxiety disorder Maternal Grandfather   . Mental illness Maternal Grandfather   . Diabetes Maternal Grandfather   . Cancer Paternal  Grandmother   . Non-Hodgkin's lymphoma Paternal Grandmother   . Sudden death Paternal Grandmother   . Asthma Mother   . Hypertension Father   . Alcohol abuse Maternal Aunt   . Hyperlipidemia Maternal Aunt     Allergies  Allergen Reactions  . Gadolinium Derivatives Itching, Nausea Only and Cough    Medication list has been reviewed and updated.  Current Outpatient Prescriptions on File Prior to Visit  Medication Sig Dispense Refill  . acetaminophen (TYLENOL) 325 MG tablet Take 650 mg by mouth every 6 (six) hours as needed.    Marland Kitchen acetaZOLAMIDE (DIAMOX) 500 MG capsule Take 1 capsule (500 mg total) by mouth daily. 90 capsule 4  . ibuprofen (ADVIL,MOTRIN) 200 MG tablet Take 200 mg by mouth every 8 (eight) hours as needed.     No current facility-administered medications on file prior to visit.     ROS ROS otherwise unremarkable unless listed above.  Physical Examination: BP 131/84   Pulse 85   Temp 98.2 F (36.8 C) (Oral)   Resp 16   Wt 278 lb (126.1 kg)   LMP 01/04/2017   SpO2 99%   BMI 44.87 kg/m  Ideal Body Weight:    Physical Exam  Constitutional: She is oriented to person, place, and time. She  appears well-developed and well-nourished. No distress.  HENT:  Head: Normocephalic and atraumatic.  Right Ear: External ear normal.  Left Ear: External ear normal.  Eyes: Pupils are equal, round, and reactive to light. Conjunctivae and EOM are normal.  Cardiovascular: Normal rate, regular rhythm, normal heart sounds and intact distal pulses.  Exam reveals no friction rub.   No murmur heard. Pulmonary/Chest: Effort normal. No respiratory distress. She has no wheezes.  Neurological: She is alert and oriented to person, place, and time.  Skin: She is not diaphoretic.  Psychiatric: She has a normal mood and affect. Her behavior is normal.     Assessment and Plan: [name] is a 21 y.o. female who is here today for cc of  Chief Complaint  Patient presents with  .  Exposure to STD    2 days ago   . Vaginal Discharge    pt states a little more than usual  treated today Advised sexual activity precautions. She has not had sexual activity following this person, she reports.. Exposure to chlamydia - Plan: azithromycin (ZITHROMAX) 500 MG tablet, GC/Chlamydia Probe Amp  Need for influenza vaccination - Plan: Flu Vaccine QUAD 36+ mos IM  Vaginal discharge - Plan: POCT urinalysis dipstick, HIV antibody, RPR, POCT Wet + KOH Prep, POCT urine pregnancy, GC/Chlamydia Probe Amp, GC/Chlamydia Probe Amp, CANCELED: GC/Chlamydia Probe Amp, CANCELED: POCT urinalysis dipstick  Exposure to STD - Plan: GC/Chlamydia Probe Amp, azithromycin (ZITHROMAX) 500 MG tablet, GC/Chlamydia Probe Amp  [name], PA-C [facility] 9/12/20189:27 AM

## 2017-02-04 NOTE — Patient Instructions (Addendum)
Please take the antibiotic all at once.   Refrain from sexual activity for 1 week. Any partner in between that time should be treated.  I will follow up with lab results.       IF you received an x-ray today, you will receive an invoice from [facility]. Please contact [facility] at [phone] with questions or concerns regarding your invoice.   IF you received labwork today, you will receive an invoice from [facility]. Please contact [facility] at [phone] with questions or concerns regarding your invoice.   Our billing staff will not be able to assist you with questions regarding bills from these companies.  You will be contacted with the lab results as soon as they are available. The fastest way to get your results is to activate your My Chart account. Instructions are located on the last page of this paperwork. If you have not heard from us regarding the results in 2 weeks, please contact this office.

## 2017-02-05 LAB — HIV ANTIBODY (ROUTINE TESTING W REFLEX): HIV SCREEN 4TH GENERATION: NONREACTIVE

## 2017-02-05 LAB — RPR: RPR: NONREACTIVE

## 2017-02-07 LAB — GC/CHLAMYDIA PROBE AMP
Chlamydia trachomatis, NAA: NEGATIVE
Neisseria gonorrhoeae by PCR: NEGATIVE

## 2017-02-08 ENCOUNTER — Encounter: Payer: Self-pay | Admitting: Physician Assistant

## 2017-02-20 ENCOUNTER — Encounter: Payer: Self-pay | Admitting: Women's Health

## 2017-02-27 ENCOUNTER — Ambulatory Visit (INDEPENDENT_AMBULATORY_CARE_PROVIDER_SITE_OTHER): Payer: BLUE CROSS/BLUE SHIELD | Admitting: Women's Health

## 2017-02-27 ENCOUNTER — Encounter: Payer: Self-pay | Admitting: Women's Health

## 2017-02-27 VITALS — BP 126/80 | Ht 66.0 in | Wt 278.0 lb

## 2017-02-27 DIAGNOSIS — Z01419 Encounter for gynecological examination (general) (routine) without abnormal findings: Secondary | ICD-10-CM

## 2017-02-27 DIAGNOSIS — Z23 Encounter for immunization: Secondary | ICD-10-CM | POA: Diagnosis not present

## 2017-02-27 DIAGNOSIS — Z30016 Encounter for initial prescription of transdermal patch hormonal contraceptive device: Secondary | ICD-10-CM

## 2017-02-27 MED ORDER — NORELGESTROMIN-ETH ESTRADIOL 150-35 MCG/24HR TD PTWK: 1.0000 | MEDICATED_PATCH | TRANSDERMAL | 4 refills | Status: DC

## 2017-02-27 NOTE — Addendum Note (Signed)
Addended by: Kem Parkinson on: 02/27/2017 10:44 AM   Modules accepted: Orders

## 2017-02-27 NOTE — Addendum Note (Signed)
Addended by: Kem Parkinson on: 02/27/2017 10:50 AM   Modules accepted: Orders

## 2017-02-27 NOTE — Progress Notes (Signed)
Stacy Mills 10/31/1995 [id number]    History:    Presents for annual exam.  Monthly 3-4 day cycle. Treated for possible chlamydia exposure 01/2017 with negative GC/Chlamydia. Currently not sexually active. Primary care manages labs and meds. Desiring contraception, questions fertility - 6 months of unprotected intercourse with past partner 2-3 times weekly with no pregnancy. History of migraines without aura. History of idiopathic intracranial pressure neurologist manages.  Past medical history, past surgical history, family history and social history were all reviewed and documented in the EPIC chart. Works at [employer] in [employer]. Mother history of gastric bypass surgery.  ROS:  A ROS was performed and pertinent positives and negatives are included.  Exam:  Vitals:   02/27/17 0947  BP: 126/80  Weight: 278 lb (126.1 kg)  Height:  (1.676 m)   Body mass index is 44.87 kg/m.   General appearance:  Normal Thyroid:  Symmetrical, normal in size, without palpable masses or nodularity. Respiratory  Auscultation:  Clear without wheezing or rhonchi Cardiovascular  Auscultation:  Regular rate, without rubs, murmurs or gallops  Edema/varicosities:  Not grossly evident Abdominal  Soft,nontender, without masses, guarding or rebound.  Liver/spleen:  No organomegaly noted  Hernia:  None appreciated  Skin  Inspection:  Grossly normal   Breasts: Examined lying and sitting.     Right: Without masses, retractions, discharge or axillary adenopathy.     Left: Without masses, retractions, discharge or axillary adenopathy. Gentitourinary   Inguinal/mons:  Normal without inguinal adenopathy  External genitalia:  Normal  BUS/Urethra/Skene's glands:  Normal  Vagina:  Normal  Cervix:  Normal  Uterus:  normal in size, shape and contour.  Midline and mobile limited exam/girth  Adnexa/parametria:     Rt: Without masses or tenderness.   Lt: Without masses or tenderness.  Anus and  perineum: Normal    Assessment/Plan:  21 y.o. SBF G0 for annual exam with no complaints.  Monthly 3 day cycle/contraception management Morbid obesity Migraines without aura Labs-primary care  Plan: Contraception options reviewed, Ortho Evra patch, reviewed slight decreased effectiveness due to weight. Start up instructions, importance of condoms, risks of blood clots and strokes reviewed. SBE's, exercise, calcium rich diet, MVI daily encouraged. Campus safety reviewed. Reviewed importance of weight loss for health, lower carb diet encouraged. Pap. First Gardasil given, instructed to return to office in 2 and 6 months.   Harrington Challenger [facility], 10:30 AM 02/27/2017

## 2017-02-27 NOTE — Patient Instructions (Signed)

## 2017-02-28 LAB — PAP IG W/ RFLX HPV ASCU

## 2017-03-20 ENCOUNTER — Telehealth: Payer: Self-pay | Admitting: *Deleted

## 2017-03-20 NOTE — Telephone Encounter (Signed)
Pt informed, bumps not from dry skin, nonething new but birth control patch, bumps were there prior to patch. Pt will try gardasil

## 2017-03-20 NOTE — Telephone Encounter (Signed)
Pt was seen on 02/27/17 received gardasil states now has a lump at injection site, no pain, also states maybe a reaction small white bumps on hands, feet, wrist. Bumps are itching and red now from scratching. Has tried benadryl cream and no relief. Asked what to do? Please advise

## 2017-03-20 NOTE — Telephone Encounter (Signed)
Please call and reassure the lump will go away, RTO for second in 2 mo from first, rub site after injection will also help prevent.  Bumps from dry skin? Benadryl at hs may help.  Not ususal reaction from gardasil, anything else new?

## 2017-03-20 NOTE — Telephone Encounter (Signed)
Left message for pt to call.

## 2017-03-20 NOTE — Telephone Encounter (Signed)
Office visit if concerned, could be eczema, could check when here for next gardasil if still present.

## 2017-03-21 NOTE — Telephone Encounter (Signed)
Pt aware.

## 2017-04-13 ENCOUNTER — Encounter: Payer: Self-pay | Admitting: Women's Health

## 2017-04-14 ENCOUNTER — Other Ambulatory Visit: Payer: Self-pay

## 2017-04-14 ENCOUNTER — Ambulatory Visit: Payer: BLUE CROSS/BLUE SHIELD | Admitting: Family Medicine

## 2017-04-14 ENCOUNTER — Encounter: Payer: Self-pay | Admitting: Family Medicine

## 2017-04-14 VITALS — BP 126/82 | HR 87 | Temp 98.0°F | Resp 16 | Ht 66.14 in | Wt 281.0 lb

## 2017-04-14 DIAGNOSIS — B3789 Other sites of candidiasis: Secondary | ICD-10-CM

## 2017-04-14 MED ORDER — FLUCONAZOLE 150 MG PO TABS: 150.0000 mg | ORAL_TABLET | Freq: Once | ORAL | 1 refills | Status: AC

## 2017-04-14 NOTE — Progress Notes (Signed)
11/16/20181:13 PM  [name] OkBrandi Schweiss 08/09/1995, 21 y.o. female [id number]  Chief Complaint  Patient presents with  . Rash    on different parts of the body that is red in color and itchy. Pt states spots have been appearing x 2 weeks.     HPI:   Patient is a 21 y.o. female who presents today for red itchy rash that started 2 weeks ago in her feet, then her groin and hands. Worse at night. Tried using topical steroid but it seems to have made it worse. Denies any new skin products, soaps, etc. Was started on birth control patch about 2 weeks ago. Denies any body else at home having similar rash.   Dep[facility] 2/9 04/14/2017 02/04/2017 05/09/2016  Decreased Interest 0 0 0  Down, Depressed, Hopeless 0 0 0  PHQ - 2 Score 0 0 0    Allergies  Allergen Reactions  . Gadolinium Derivatives Itching, Nausea Only and Cough    Prior to Admission medications   Medication Sig Start Date End Date Taking? Authorizing Provider  acetaminophen (TYLENOL) 325 MG tablet Take 650 mg by mouth every 6 (six) hours as needed.   Yes [provider]  acetaZOLAMIDE (DIAMOX) 500 MG capsule Take 1 capsule (500 mg total) by mouth daily. 01/31/17  Yes Penumalli, Glenford BayleyVikram R, MD  ibuprofen (ADVIL,MOTRIN) 200 MG tablet Take 200 mg by mouth every 8 (eight) hours as needed.   Yes [provider]  fluconazole (DIFLUCAN) 150 MG tablet Take 1 tablet (150 mg total) once for 1 dose by mouth. 04/14/17 04/14/17  Myles LippsSantiago, Contessa Preuss M, MD    Past Medical History:  Diagnosis Date  . Allergy   . Anxiety   . Depression   . IIH (idiopathic intracranial hypertension)   . IUD (intrauterine device) in place    Paraguard placed 2014  . Migraine   . Pelvic inflammatory disease 10/2012    Past Surgical History:  Procedure Laterality Date  . BRAIN SURGERY    . NO PAST SURGERIES      Social History   Tobacco Use  . Smoking status: Never Smoker  . Smokeless tobacco: Never Used  Substance Use Topics  .  Alcohol use: Yes    Alcohol/week: 1.2 oz    Types: 2 Shots of liquor per week    Comment: social    Family History  Problem Relation Age of Onset  . Emphysema Maternal Grandfather   . Depression Maternal Grandfather   . Anxiety disorder Maternal Grandfather   . Mental illness Maternal Grandfather   . Diabetes Maternal Grandfather   . Cancer Paternal Grandmother   . Non-Hodgkin's lymphoma Paternal Grandmother   . Sudden death Paternal Grandmother   . Asthma Mother   . Hypertension Father   . Alcohol abuse Maternal Aunt   . Hyperlipidemia Maternal Aunt     ROS Per HPI  OBJECTIVE:  Blood pressure 126/82, pulse 87, temperature 98 F (36.7 C), temperature source Oral, resp. rate 16, height 5' 6.14" (1.68 m), weight 281 lb (127.5 kg), last menstrual period 04/07/2017, SpO2 98 %.  Physical Exam Gen: AAOx3, NAD Skin: feet/lower legs with very scant hyperpigmented macules, resolving lesions per patient. Hands with very few pigmented papules along fingers. Groin are with large hypopigmented slightly raised plaques with evidence of excoriation.    ASSESSMENT and PLAN  1. Candida rash of groin Discussed supportive measures, new meds r/se/b and RTC precautions. Patient educational handout given. - fluconazole (DIFLUCAN) 150 MG tablet;  Take 1 tablet (150 mg total) once for 1 dose by mouth.  Return if symptoms worsen or fail to improve.    Myles LippsIrma M Santiago, MD Primary Care at [facility] [address] [place], [place] [address] Ph.  [phone] Fax [phone]

## 2017-04-14 NOTE — Patient Instructions (Signed)
     IF you received an x-ray today, you will receive an invoice from [facility]. Please contact [facility] at [phone] with questions or concerns regarding your invoice.   IF you received labwork today, you will receive an invoice from [facility]. Please contact [facility] at [phone] with questions or concerns regarding your invoice.   Our billing staff will not be able to assist you with questions regarding bills from these companies.  You will be contacted with the lab results as soon as they are available. The fastest way to get your results is to activate your My Chart account. Instructions are located on the last page of this paperwork. If you have not heard from us regarding the results in 2 weeks, please contact this office.     

## 2017-04-16 ENCOUNTER — Encounter (HOSPITAL_COMMUNITY): Payer: Self-pay | Admitting: Emergency Medicine

## 2017-04-16 ENCOUNTER — Other Ambulatory Visit: Payer: Self-pay

## 2017-04-16 ENCOUNTER — Ambulatory Visit (HOSPITAL_COMMUNITY)
Admission: EM | Admit: 2017-04-16 | Discharge: 2017-04-16 | Disposition: A | Discharge status: Home / Self Care | Payer: BLUE CROSS/BLUE SHIELD | Attending: Family Medicine | Admitting: Family Medicine

## 2017-04-16 DIAGNOSIS — R21 Rash and other nonspecific skin eruption: Secondary | ICD-10-CM | POA: Diagnosis not present

## 2017-04-16 NOTE — ED Provider Notes (Signed)
[facility]    CSN: [id number] Arrival date & time: 04/16/17  1709     History   Chief Complaint Chief Complaint  Patient presents with  . Rash    HPI Stacy Mills is a 21 y.o. female.   21 year old female complaining of itchy rash to the hands and feet for 2 and half weeks. Started on the feet and later developed on the hands. She takes Benadryl at night which helps. She saw one physician who thought it might be contact dermatitis and she was prescribed a BM potency steroid that she use for 2 weeks and did not help at all. A second visit to a physician yielded a possible diagnosis of fungus. Treatment did not work. No change. She is supposed to call her PCP tomorrow to check in on how she is doing and plans to see her.      Past Medical History:  Diagnosis Date  . Allergy   . Anxiety   . Depression   . IIH (idiopathic intracranial hypertension)   . IUD (intrauterine device) in place    Paraguard placed 2014, removed 10/2015  . Migraine   . Pelvic inflammatory disease 10/2012    Patient Active Problem List   Diagnosis Date Noted  . Idiopathic intracranial hypertension   . Migraines 12/22/2014  . PID (acute pelvic inflammatory disease) 11/07/2012  . Hemorrhagic cyst of ovary 11/07/2012  . Severe obesity (BMI >= 40) (HCC) 08/26/2012    Past Surgical History:  Procedure Laterality Date  . BRAIN SURGERY    . NO PAST SURGERIES      OB History    Gravida Para Term Preterm AB Living   0       0 0   SAB TAB Ectopic Multiple Live Births       0           Home Medications    Prior to Admission medications   Medication Sig Start Date End Date Taking? Authorizing Provider  acetaminophen (TYLENOL) 325 MG tablet Take 650 mg by mouth every 6 (six) hours as needed.    [provider]  acetaZOLAMIDE (DIAMOX) 500 MG capsule Take 1 capsule (500 mg total) by mouth daily. 01/31/17   Penumalli, Glenford BayleyVikram R, MD  ibuprofen (ADVIL,MOTRIN) 200 MG tablet Take  200 mg by mouth every 8 (eight) hours as needed.    [provider]    Family History Family History  Problem Relation Age of Onset  . Emphysema Maternal Grandfather   . Depression Maternal Grandfather   . Anxiety disorder Maternal Grandfather   . Mental illness Maternal Grandfather   . Diabetes Maternal Grandfather   . Cancer Paternal Grandmother   . Non-Hodgkin's lymphoma Paternal Grandmother   . Sudden death Paternal Grandmother   . Asthma Mother   . Hypertension Father   . Alcohol abuse Maternal Aunt   . Hyperlipidemia Maternal Aunt     Social History Social History   Tobacco Use  . Smoking status: Never Smoker  . Smokeless tobacco: Never Used  Substance Use Topics  . Alcohol use: Yes    Alcohol/week: 1.2 oz    Types: 2 Shots of liquor per week    Comment: social  . Drug use: Yes    Types: Other-see comments, Marijuana     Allergies   Gadolinium derivatives   Review of Systems Review of Systems  Constitutional: Negative.   HENT: Negative.   Respiratory: Negative.   Skin: Positive for rash.  All other systems reviewed and are negative.    Physical Exam Triage Vital Signs ED Triage Vitals [04/16/17 1737]  Enc Vitals Group     BP 121/86     Pulse Rate 77     Resp (!) 22     Temp 98.3 F (36.8 C)     Temp Source Oral     SpO2 100 %     Weight      Height      Head Circumference      Peak Flow      Pain Score      Pain Loc      Pain Edu?      Excl. in GC?    No data found.  Updated Vital Signs BP 121/86 (BP Location: Left Arm) Comment (BP Location): large cuff  Pulse 77   Temp 98.3 F (36.8 C) (Oral)   Resp (!) 22   LMP 04/07/2017   SpO2 100%   Visual Acuity Right Eye Distance:   Left Eye Distance:   Bilateral Distance:    Right Eye Near:   Left Eye Near:    Bilateral Near:     Physical Exam  Constitutional: She is oriented to person, place, and time. She appears well-developed and well-nourished. No distress.  Eyes:  EOM are normal.  Neck: Normal range of motion. Neck supple.  Cardiovascular: Normal rate.  Pulmonary/Chest: Effort normal. No respiratory distress.  Musculoskeletal: She exhibits no edema.  Neurological: She is alert and oriented to person, place, and time. She exhibits normal muscle tone.  Skin: Skin is warm and dry.  Small, 1-2 mm papular vesicular type lesions to the hands primarily to the wrist, 2-3 interdigital lesions. No burrowing. No flaking or scratch marks. Same lesions to the ankles and proximal feet. No lesions to the palms or soles.  Psychiatric: She has a normal mood and affect.  Nursing note and vitals reviewed.    UC Treatments / Results  Labs (all labs ordered are listed, but only abnormal results are displayed) Labs Reviewed - No data to display  EKG  EKG Interpretation None       Radiology No results found.  Procedures Procedures (including critical care time)  Medications Ordered in UC Medications - No data to display   Initial Impression / Assessment and Plan / UC Course  I have reviewed the triage vital signs and the nursing notes.  Pertinent labs & imaging results that were available during my care of the patient were reviewed by me and considered in my medical decision making (see chart for details).     Uncertain as the cause of the rash. Does not appear to be scabies. Does not appear to be fungus. It appears to be more like a viral type of rash based on the location of hands and feet and appearance. Call the primary care provider tomorrow hopefully he will get an appointment. Take Allegra or Zyrtec during the day to help with itching   Final Clinical Impressions(s) / UC Diagnoses   Final diagnoses:  Rash    ED Discharge Orders    None       Controlled Substance Prescriptions [place] Controlled Substance Registry consulted? Not Applicable   Hayden RasmussenMabe, Magali Bray, NP 04/16/17 [phone]

## 2017-04-16 NOTE — ED Triage Notes (Signed)
Patient has a rash for 2 weeks.  Patient says she has seen 2 doctors about this and been treated with a antifungal drup per patient.  Patient says her mother wants her to get seen sooner .  Rash on hands, feet, genital area

## 2017-04-16 NOTE — Discharge Instructions (Signed)
Uncertain as the cause of the rash. Does not appear to be scabies. Does not appear to be fungus. It appears to be more like a viral type of rash based on the location of hands and feet and appearance. Call the primary care provider tomorrow hopefully he will get an appointment. Take Allegra or Zyrtec during the day to help with itching

## 2017-04-17 ENCOUNTER — Ambulatory Visit (INDEPENDENT_AMBULATORY_CARE_PROVIDER_SITE_OTHER): Payer: BLUE CROSS/BLUE SHIELD | Admitting: Physician Assistant

## 2017-04-17 ENCOUNTER — Encounter: Payer: Self-pay | Admitting: Physician Assistant

## 2017-04-17 ENCOUNTER — Other Ambulatory Visit: Payer: Self-pay

## 2017-04-17 VITALS — BP 127/84 | HR 100 | Temp 98.5°F | Resp 16 | Ht 66.0 in | Wt 276.0 lb

## 2017-04-17 DIAGNOSIS — R21 Rash and other nonspecific skin eruption: Secondary | ICD-10-CM | POA: Diagnosis not present

## 2017-04-17 DIAGNOSIS — N760 Acute vaginitis: Secondary | ICD-10-CM

## 2017-04-17 LAB — POCT SKIN KOH: SKIN KOH, POC: NEGATIVE

## 2017-04-17 MED ORDER — NYSTATIN 100000 UNIT/GM EX CREA: 1.0000 "application " | TOPICAL_CREAM | Freq: Two times a day (BID) | CUTANEOUS | 0 refills | Status: DC

## 2017-04-17 MED ORDER — FLUCONAZOLE 150 MG PO TABS: 150.0000 mg | ORAL_TABLET | ORAL | 0 refills | Status: DC

## 2017-04-17 NOTE — Progress Notes (Signed)
[facility] [address], [place] [address] [phone]  Date:  04/17/2017   Name:  Stacy Mills Coykendall   DOB:  03/28/1996   MRN:  [id number]  PCP:  Anne NgNche, Charlotte Lum, NP    History of Present Illness:  Stacy Mills Dollard is a 21 y.o. female patient who presents to PCP with  Chief Complaint  Patient presents with  . Rash    x 3 wks/ pt was seen friday by dr. Leretha Polsantiago. btwn legs and wrist     Patient reports that she continues to have rash for the last 3 weeks.  She was seen 3 days ago for rash of groin and treated for a fungal infection with one tablet of difllucan.  She states that she has had some relief with taking the diflucan.  She had attempted steroid cream, but this seemed to have made her symptoms worse.  She was given zyrtec in the ED the day prior.    Patient Active Problem List   Diagnosis Date Noted  . Idiopathic intracranial hypertension   . Migraines 12/22/2014  . PID (acute pelvic inflammatory disease) 11/07/2012  . Hemorrhagic cyst of ovary 11/07/2012  . Severe obesity (BMI >= 40) (HCC) 08/26/2012    Past Medical History:  Diagnosis Date  . Allergy   . Anxiety   . Depression   . IIH (idiopathic intracranial hypertension)   . IUD (intrauterine device) in place    Paraguard placed 2014, removed 10/2015  . Migraine   . Pelvic inflammatory disease 10/2012    Past Surgical History:  Procedure Laterality Date  . BRAIN SURGERY    . NO PAST SURGERIES      Social History   Tobacco Use  . Smoking status: Never Smoker  . Smokeless tobacco: Never Used  Substance Use Topics  . Alcohol use: Yes    Alcohol/week: 1.2 oz    Types: 2 Shots of liquor per week    Comment: social  . Drug use: Yes    Types: Other-see comments, Marijuana    Family History  Problem Relation Age of Onset  . Emphysema Maternal Grandfather   . Depression Maternal Grandfather   . Anxiety disorder Maternal Grandfather   . Mental illness Maternal Grandfather   . Diabetes  Maternal Grandfather   . Cancer Paternal Grandmother   . Non-Hodgkin's lymphoma Paternal Grandmother   . Sudden death Paternal Grandmother   . Asthma Mother   . Hypertension Father   . Alcohol abuse Maternal Aunt   . Hyperlipidemia Maternal Aunt     Allergies  Allergen Reactions  . Gadolinium Derivatives Itching, Nausea Only and Cough    Medication list has been reviewed and updated.  Current Outpatient Medications on File Prior to Visit  Medication Sig Dispense Refill  . acetaminophen (TYLENOL) 325 MG tablet Take 650 mg by mouth every 6 (six) hours as needed.    Marland Kitchen. acetaZOLAMIDE (DIAMOX) 500 MG capsule Take 1 capsule (500 mg total) by mouth daily. 90 capsule 4  . ibuprofen (ADVIL,MOTRIN) 200 MG tablet Take 200 mg by mouth every 8 (eight) hours as needed.     No current facility-administered medications on file prior to visit.     ROS ROS otherwise unremarkable unless listed above.  Physical Examination: BP 127/84   Pulse 100   Temp 98.5 F (36.9 C) (Oral)   Resp 16   Ht 5\' 6"  (1.676 m)   Wt 276 lb (125.2 kg)   LMP 04/07/2017   SpO2  99%   BMI 44.55 kg/m  Ideal Body Weight: Weight in (lb) to have BMI = 25: 154.6  Physical Exam  Skin:  Rash along the groin area with hyperpigmentation to the inner thigh.  Moist and macerated skin as we approach the proximal area toward the vaginal area with scant papules. Lesions along her hands with papules.     Results for orders placed or performed in visit on 04/17/17  POCT Skin KOH  Result Value Ref Range   Skin KOH, POC Negative Negative   Assessment and Plan: Stacy Mills Abello is a 21 y.o. female who is here today for cc of  Chief Complaint  Patient presents with  . Rash    x 3 wks/ pt was seen friday by dr. Leretha Polsantiago. btwn legs and wrist   This had some improvement with the antifungal cream, and I will treat for her severity with added doses.  Perhaps bad sample for skin koh.  Advised for her to return in 10  days. Vaginitis and vulvovaginitis - Plan: POCT Skin KOH, nystatin cream (MYCOSTATIN), DISCONTINUED: fluconazole (DIFLUCAN) 150 MG tablet  Rash and nonspecific skin eruption - Plan: POCT Skin KOH  Groin rash - Plan: DISCONTINUED: fluconazole (DIFLUCAN) 150 MG tablet  Trena PlattStephanie Vianey Caniglia, PA-C [facility] 11/25/20186:23 PM  There are no diagnoses linked to this encounter.  Trena PlattStephanie Raymona Boss, PA-C [facility] 04/17/2017 5:47 PM

## 2017-04-17 NOTE — Patient Instructions (Addendum)
Please take oral antifungal as prescribed. I would like you to apply the cream as well.  This is likely fungal.  Sample was not a good specimen.     IF you received an x-ray today, you will receive an invoice from [facility]. Please contact [facility] at [phone] with questions or concerns regarding your invoice.   IF you received labwork today, you will receive an invoice from [facility]. Please contact [facility] at [phone] with questions or concerns regarding your invoice.   Our billing staff will not be able to assist you with questions regarding bills from these companies.  You will be contacted with the lab results as soon as they are available. The fastest way to get your results is to activate your My Chart account. Instructions are located on the last page of this paperwork. If you have not heard from us regarding the results in 2 weeks, please contact this office.

## 2017-04-22 ENCOUNTER — Encounter: Payer: Self-pay | Admitting: Family Medicine

## 2017-04-22 ENCOUNTER — Ambulatory Visit: Payer: BLUE CROSS/BLUE SHIELD | Admitting: Family Medicine

## 2017-04-22 VITALS — BP 124/85 | HR 95 | Temp 98.1°F | Resp 16 | Ht 66.0 in | Wt 278.0 lb

## 2017-04-22 DIAGNOSIS — L739 Follicular disorder, unspecified: Secondary | ICD-10-CM

## 2017-04-22 DIAGNOSIS — L301 Dyshidrosis [pompholyx]: Secondary | ICD-10-CM | POA: Diagnosis not present

## 2017-04-22 MED ORDER — MUPIROCIN 2 % EX OINT: 1.0000 "application " | TOPICAL_OINTMENT | Freq: Three times a day (TID) | CUTANEOUS | 0 refills | Status: DC

## 2017-04-22 MED ORDER — BETAMETHASONE DIPROPIONATE 0.05 % EX CREA
TOPICAL_CREAM | Freq: Two times a day (BID) | CUTANEOUS | 0 refills | Status: DC
Start: 2017-04-22 — End: 2017-05-15

## 2017-04-22 NOTE — Progress Notes (Signed)
11/24/201812:45 PM  Stacy Mills 01/11/1996, 21 y.o. female [id number]  Chief Complaint  Patient presents with  . Pruritis    Hands, Legs, & Feet. States she has bumps that grow and burst. (White coloration)    HPI:   Patient is a 21 y.o. female who presents today for followup on very itchy rash of hands and feet. Groin area somewhat improved with antifungals but not hands or feet. Nobody else in the household with similar symptoms. Did not respond either to desonide. Has been taking oral antihistamines for the itchiness. Denies any new exposures.   Also having new pustules in legs.  Depression screen [facility] 2/9 04/22/2017 04/14/2017 02/04/2017  Decreased Interest 0 0 0  Down, Depressed, Hopeless 0 0 0  PHQ - 2 Score 0 0 0    Allergies  Allergen Reactions  . Gadolinium Derivatives Itching, Nausea Only and Cough    Prior to Admission medications   Medication Sig Start Date End Date Taking? Authorizing Provider  acetaminophen (TYLENOL) 325 MG tablet Take 650 mg by mouth every 6 (six) hours as needed.   Yes [provider]  acetaZOLAMIDE (DIAMOX) 500 MG capsule Take 1 capsule (500 mg total) by mouth daily. 01/31/17  Yes Penumalli, Vikram R, MD  DiphenhydrAMINE HCl (BENADRYL PO) Take by mouth.   Yes [provider]  ibuprofen (ADVIL,MOTRIN) 200 MG tablet Take 200 mg by mouth every 8 (eight) hours as needed.   Yes [provider]  nystatin cream (MYCOSTATIN) Apply 1 application 2 (two) times daily topically. 04/17/17  Yes Garnetta BuddyEnglish, Stephanie D, PA    Past Medical History:  Diagnosis Date  . Allergy   . Anxiety   . Depression   . IIH (idiopathic intracranial hypertension)   . IUD (intrauterine device) in place    Paraguard placed 2014, removed 10/2015  . Migraine   . Pelvic inflammatory disease 10/2012    Past Surgical History:  Procedure Laterality Date  . BRAIN SURGERY    . NO PAST SURGERIES      Social History   Tobacco Use  . Smoking  status: Never Smoker  . Smokeless tobacco: Never Used  Substance Use Topics  . Alcohol use: Yes    Alcohol/week: 1.2 oz    Types: 2 Shots of liquor per week    Comment: social    Family History  Problem Relation Age of Onset  . Emphysema Maternal Grandfather   . Depression Maternal Grandfather   . Anxiety disorder Maternal Grandfather   . Mental illness Maternal Grandfather   . Diabetes Maternal Grandfather   . Cancer Paternal Grandmother   . Non-Hodgkin's lymphoma Paternal Grandmother   . Sudden death Paternal Grandmother   . Asthma Mother   . Hypertension Father   . Alcohol abuse Maternal Aunt   . Hyperlipidemia Maternal Aunt     ROS Per hpi  OBJECTIVE:  Blood pressure 124/85, pulse 95, temperature 98.1 F (36.7 C), temperature source Oral, resp. rate 16, height 5\' 6"  (1.676 m), weight 278 lb (126.1 kg), last menstrual period 04/07/2017, SpO2 97 %.  Physical Exam  Gen: AAOx3, NAD Skin: clustering of erythematous papules with some scaling along interdigit web and sides of fingers and toes. No burrows noted.  Right lower leg with 3 small pustules overlying follicle   ASSESSMENT and PLAN 1. Eczema, dyshidrotic Discussed supportive measures, new meds r/se/b and RTC precautions. Patient educational handout given. - betamethasone dipropionate (DIPROLENE) 0.05 % cream; Apply topically 2 (two) times daily. -  Ambulatory referral to Dermatology  2. Folliculitis - mupirocin ointment (BACTROBAN) 2 %; Apply 1 application topically 3 (three) times daily.  Return if symptoms worsen or fail to improve.    Myles LippsIrma M Santiago, MD [facility] at [facility] [address] [place], [place] [address] Ph.  [phone] Fax [phone]

## 2017-04-22 NOTE — Patient Instructions (Signed)
     IF you received an x-ray today, you will receive an invoice from [facility]. Please contact [facility] at [phone] with questions or concerns regarding your invoice.   IF you received labwork today, you will receive an invoice from [facility]. Please contact [facility] at [phone] with questions or concerns regarding your invoice.   Our billing staff will not be able to assist you with questions regarding bills from these companies.  You will be contacted with the lab results as soon as they are available. The fastest way to get your results is to activate your My Chart account. Instructions are located on the last page of this paperwork. If you have not heard from us regarding the results in 2 weeks, please contact this office.     

## 2017-04-27 ENCOUNTER — Ambulatory Visit: Payer: BLUE CROSS/BLUE SHIELD | Admitting: Physician Assistant

## 2017-05-01 ENCOUNTER — Ambulatory Visit: Payer: BLUE CROSS/BLUE SHIELD

## 2017-05-10 ENCOUNTER — Encounter: Payer: BLUE CROSS/BLUE SHIELD | Admitting: Nurse Practitioner

## 2017-05-15 ENCOUNTER — Other Ambulatory Visit (HOSPITAL_COMMUNITY)
Admission: RE | Admit: 2017-05-15 | Discharge: 2017-05-15 | Disposition: A | Discharge status: Home / Self Care | Payer: BLUE CROSS/BLUE SHIELD | Source: Ambulatory Visit | Attending: Nurse Practitioner | Admitting: Nurse Practitioner

## 2017-05-15 ENCOUNTER — Other Ambulatory Visit: Payer: Self-pay

## 2017-05-15 ENCOUNTER — Encounter: Payer: Self-pay | Admitting: Nurse Practitioner

## 2017-05-15 ENCOUNTER — Ambulatory Visit (INDEPENDENT_AMBULATORY_CARE_PROVIDER_SITE_OTHER): Payer: BLUE CROSS/BLUE SHIELD | Admitting: Nurse Practitioner

## 2017-05-15 ENCOUNTER — Ambulatory Visit: Payer: BLUE CROSS/BLUE SHIELD | Admitting: Physician Assistant

## 2017-05-15 VITALS — BP 118/62 | HR 100 | Temp 98.2°F | Resp 16 | Ht 66.0 in | Wt 274.0 lb

## 2017-05-15 VITALS — BP 120/86 | HR 90 | Temp 98.2°F | Ht 66.0 in | Wt 274.0 lb

## 2017-05-15 DIAGNOSIS — Z13220 Encounter for screening for lipoid disorders: Secondary | ICD-10-CM

## 2017-05-15 DIAGNOSIS — Z136 Encounter for screening for cardiovascular disorders: Secondary | ICD-10-CM

## 2017-05-15 DIAGNOSIS — Z113 Encounter for screening for infections with a predominantly sexual mode of transmission: Secondary | ICD-10-CM

## 2017-05-15 DIAGNOSIS — R21 Rash and other nonspecific skin eruption: Secondary | ICD-10-CM

## 2017-05-15 DIAGNOSIS — Z0000 Encounter for general adult medical examination without abnormal findings: Secondary | ICD-10-CM | POA: Diagnosis not present

## 2017-05-15 LAB — CBC WITH DIFFERENTIAL/PLATELET
BASOS ABS: 0 10*3/uL (ref 0.0–0.1)
Basophils Relative: 0.4 % (ref 0.0–3.0)
EOS ABS: 0.3 10*3/uL (ref 0.0–0.7)
Eosinophils Relative: 3.9 % (ref 0.0–5.0)
HCT: 40 % (ref 36.0–46.0)
Hemoglobin: 12.8 g/dL (ref 12.0–15.0)
LYMPHS ABS: 2.9 10*3/uL (ref 0.7–4.0)
Lymphocytes Relative: 33.4 % (ref 12.0–46.0)
MCHC: 32 g/dL (ref 30.0–36.0)
MCV: 87.6 fl (ref 78.0–100.0)
MONO ABS: 0.6 10*3/uL (ref 0.1–1.0)
MONOS PCT: 6.3 % (ref 3.0–12.0)
NEUTROS ABS: 4.9 10*3/uL (ref 1.4–7.7)
NEUTROS PCT: 56 % (ref 43.0–77.0)
PLATELETS: 349 10*3/uL (ref 150.0–400.0)
RBC: 4.57 Mil/uL (ref 3.87–5.11)
RDW: 13.2 % (ref 11.5–15.5)
WBC: 8.7 10*3/uL (ref 4.0–10.5)

## 2017-05-15 LAB — COMPREHENSIVE METABOLIC PANEL
ALK PHOS: 55 U/L (ref 39–117)
ALT: 18 U/L (ref 0–35)
AST: 20 U/L (ref 0–37)
Albumin: 4 g/dL (ref 3.5–5.2)
BILIRUBIN TOTAL: 0.4 mg/dL (ref 0.2–1.2)
BUN: 12 mg/dL (ref 6–23)
CO2: 26 meq/L (ref 19–32)
Calcium: 9.3 mg/dL (ref 8.4–10.5)
Chloride: 108 mEq/L (ref 96–112)
Creatinine, Ser: 0.9 mg/dL (ref 0.40–1.20)
GFR: 82.56 mL/min (ref >60.00)
GLUCOSE: 84 mg/dL (ref 70–99)
Potassium: 4.1 mEq/L (ref 3.5–5.1)
SODIUM: 140 meq/L (ref 135–145)
TOTAL PROTEIN: 7 g/dL (ref 6.0–8.3)

## 2017-05-15 LAB — LIPID PANEL
CHOLESTEROL: 154 mg/dL (ref 0–200)
HDL: 64.2 mg/dL (ref >39.00)
LDL Cholesterol: 75 mg/dL (ref 0–99)
NONHDL: 89.68
Total CHOL/HDL Ratio: 2
Triglycerides: 73 mg/dL (ref 0.0–149.0)
VLDL: 14.6 mg/dL (ref 0.0–40.0)

## 2017-05-15 LAB — POCT URINE PREGNANCY: PREG TEST UR: NEGATIVE

## 2017-05-15 LAB — TSH: TSH: 0.81 u[IU]/mL (ref 0.35–4.50)

## 2017-05-15 MED ORDER — DOXYCYCLINE HYCLATE 100 MG PO CAPS: 100.0000 mg | ORAL_CAPSULE | Freq: Two times a day (BID) | ORAL | 0 refills | Status: DC

## 2017-05-15 MED ORDER — CETIRIZINE HCL 10 MG PO TABS: 10.0000 mg | ORAL_TABLET | Freq: Every day | ORAL | 1 refills | Status: DC

## 2017-05-15 MED ORDER — BETAMETHASONE DIPROPIONATE 0.05 % EX CREA: TOPICAL_CREAM | Freq: Two times a day (BID) | CUTANEOUS | 0 refills | Status: DC

## 2017-05-15 NOTE — Progress Notes (Signed)
Subjective:    Patient ID: Stacy Mills, female    DOB: 03/13/1996, 21 y.o.   MRN: [id number]  Patient presents today for complete physical  HPI   she denies any acute complaints.  Generalized itching x 4weeks. No improvement with topical steroids and oral antihistamine. Has appt with dermatology today.  Immunizations: (TDAP, Hep C screen, Pneumovax, Influenza, zoster)  Health Maintenance  Topic Date Due  . Chlamydia screening  05/09/2017  . Pap Smear  02/28/2020  . Tetanus Vaccine  12/18/2022  . Flu Shot  Completed  . HIV Screening  Completed   Diet:regular.  Weight:  Wt Readings from Last 3 Encounters:  05/15/17 274 lb (124.3 kg)  04/22/17 278 lb (126.1 kg)  04/17/17 276 lb (125.2 kg)   Exercise:walking.  Fall Risk: Fall Risk  05/15/2017 04/22/2017 04/14/2017 02/04/2017 05/09/2016  Falls in the past year? No No No No No   Home Safety: home with mother and grand father.  Depression/Suicide: Depression screen [facility] 2/[facility] 05/15/2017 04/22/2017 04/14/2017 02/04/2017 05/09/2016 12/22/2014  Decreased Interest 0 0 0 0 0 0  Down, Depressed, Hopeless 0 0 0 0 0 0  PHQ - 2 Score 0 0 0 0 0 0   Vision: up to date.  Dental: up to date.  Advanced Directive: Advanced Directives 04/09/2016  Does Patient Have a Medical Advance Directive? No  Would patient like information on creating a medical advance directive? No - patient declined information   Sexual History (birth control, marital status, STD):single, sexually active, condom use.  Medications and allergies reviewed with patient and updated if appropriate.  Patient Active Problem List   Diagnosis Date Noted  . Idiopathic intracranial hypertension   . Migraines 12/22/2014  . PID (acute pelvic inflammatory disease) 11/07/2012  . Hemorrhagic cyst of ovary 11/07/2012  . Severe obesity (BMI >= 40) (HCC) 08/26/2012    Current Outpatient Medications on File Prior to Visit  Medication Sig Dispense Refill  . acetaminophen  (TYLENOL) 325 MG tablet Take 650 mg by mouth every 6 (six) hours as needed.    Marland Kitchen. acetaZOLAMIDE (DIAMOX) 500 MG capsule Take 1 capsule (500 mg total) by mouth daily. 90 capsule 4  . betamethasone dipropionate (DIPROLENE) 0.05 % cream Apply topically 2 (two) times daily. 30 g 0  . DiphenhydrAMINE HCl (BENADRYL PO) Take by mouth.    Marland Kitchen. ibuprofen (ADVIL,MOTRIN) 200 MG tablet Take 200 mg by mouth every 8 (eight) hours as needed.    . mupirocin ointment (BACTROBAN) 2 % Apply 1 application topically 3 (three) times daily. 30 g 0  . nystatin cream (MYCOSTATIN) Apply 1 application 2 (two) times daily topically. 30 g 0   No current facility-administered medications on file prior to visit.     Past Medical History:  Diagnosis Date  . Allergy   . Anxiety   . Depression   . IIH (idiopathic intracranial hypertension)   . IUD (intrauterine device) in place    Paraguard placed 2014, removed 10/2015  . Migraine   . Pelvic inflammatory disease 10/2012    Past Surgical History:  Procedure Laterality Date  . BRAIN SURGERY    . NO PAST SURGERIES      Social History   Socioeconomic History  . Marital status: Single    Spouse name: n/a  . Number of children: 0  . Years of education: 12+  . Highest education level: None  Social Needs  . Financial resource strain: None  . Food insecurity - worry: None  .  Food insecurity - inability: None  . Transportation needs - medical: None  . Transportation needs - non-medical: None  Occupational History  . 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 Smoking status: Never Smoker  . Smokeless tobacco: Never Used  Substance and Sexual Activity  . Alcohol use: Yes    Alcohol/week: 1.2 oz    Types: 2 Shots of liquor per week    Comment: social  . Drug use: No  . Sexual activity: Yes    Partners: Male    Birth control/protection: Condom    Comment: intercourse age 21, more than 5 sexual partners  Other Topics Concern  . None  Social History Narrative     Graduated from high school 10/2012, currently attending college.   Right-handed   Caffeine: 3x/wk    Family History  Problem Relation Age of Onset  . Emphysema Maternal Grandfather   . Depression Maternal Grandfather   . Anxiety disorder Maternal Grandfather   . Mental illness Maternal Grandfather   . Diabetes Maternal Grandfather   . Cancer Paternal Grandmother   . Non-Hodgkin's lymphoma Paternal Grandmother   . Sudden death Paternal Grandmother   . Asthma Mother   . Hypertension Father   . Alcohol abuse Maternal Aunt   . Hyperlipidemia Maternal Aunt         Review of Systems  Constitutional: Negative for fever, malaise/fatigue and weight loss.  HENT: Negative for congestion and sore throat.   Eyes:       Negative for visual changes  Respiratory: Negative for cough and shortness of breath.   Cardiovascular: Negative for chest pain, palpitations and leg swelling.  Gastrointestinal: Negative for blood in stool, constipation, diarrhea and heartburn.  Genitourinary: Negative for dysuria, frequency and urgency.  Musculoskeletal: Negative for falls, joint pain and myalgias.  Skin: Positive for itching. Negative for rash.  Neurological: Negative for dizziness, sensory change and headaches.  Endo/Heme/Allergies: Negative for environmental allergies and polydipsia. Does not bruise/bleed easily.  Psychiatric/Behavioral: Negative for depression, substance abuse and suicidal ideas. The patient is not nervous/anxious and does not have insomnia.     Objective:   Vitals:   05/15/17 0823  BP: 120/86  Pulse: 90  Temp: 98.2 F (36.8 C)  SpO2: 99%    Body mass index is 44.22 kg/m.   Physical Examination:  Physical Exam  Constitutional: She is oriented to person, place, and time and well-developed, well-nourished, and in no distress. No distress.  HENT:  Right Ear: External ear normal.  Left Ear: External ear normal.  Nose: Nose normal.  Mouth/Throat: Oropharynx is clear  and moist. No oropharyngeal exudate.  Eyes: Conjunctivae and EOM are normal. Pupils are equal, round, and reactive to light. No scleral icterus.  Neck: Normal range of motion. Neck supple. No thyromegaly present.  Cardiovascular: Normal rate, normal heart sounds and intact distal pulses.  Pulmonary/Chest: Effort normal and breath sounds normal. She exhibits no tenderness.  Breast exam deferred to GYN  Abdominal: Soft. Bowel sounds are normal. She exhibits no distension. There is no tenderness.  Genitourinary:  Genitourinary Comments: Deferred to GYN  Musculoskeletal: Normal range of motion. She exhibits no edema or tenderness.  Lymphadenopathy:    She has no cervical adenopathy.  Neurological: She is alert and oriented to person, place, and time. Gait normal.  Skin: Skin is warm and dry.  Psychiatric: Affect and judgment normal.   ASSESSMENT and PLAN:  Stacy Mills was seen today for annual exam.  Diagnoses and all orders for this  visit:  Preventative health care -     CBC with Differential/Platelet -     Comprehensive metabolic panel -     TSH -     Lipid panel -     Urine cytology ancillary only  Encounter for lipid screening for cardiovascular disease -     Lipid panel  Screening examination for STD (sexually transmitted disease) -     Urine cytology ancillary only   No problem-specific Assessment & Plan notes found for this encounter.     Follow up: Return if symptoms worsen or fail to improve.  Alysia Penna, NP

## 2017-05-15 NOTE — Patient Instructions (Addendum)
Go to lab for blood draw and urine collection.  Encourage regular exercise and heart healthy diet.   Preventive Care for [place], Female The transition to life after high school as a young adult can be a stressful time with many changes. You may start seeing a primary care physician instead of a pediatrician. This is the time when your health care becomes your responsibility. Preventive care refers to lifestyle choices and visits with your health care provider that can promote health and wellness. What does preventive care include?  A yearly physical exam. This is also called an annual wellness visit.  Dental exams once or twice a year.  Routine eye exams. Ask your health care provider how often you should have your eyes checked.  Personal lifestyle choices, including: ? Daily care of your teeth and gums. ? Regular physical activity. ? Eating a healthy diet. ? Avoiding tobacco and drug use. ? Avoiding or limiting alcohol use. ? Practicing safe sex. ? Taking vitamin and mineral supplements as recommended by your health care provider. What happens during an annual wellness visit? Preventive care starts with a yearly visit to your primary care physician. The services and screenings done by your health care provider during your annual wellness visit will depend on your overall health, lifestyle risk factors, and family history of disease. Counseling Your health care provider may ask you questions about:  Past medical problems and your family's medical history.  Medicines or supplements you take.  Health insurance and access to health care.  Alcohol, tobacco, and drug use.  Your safety at home, work, or school.  Access to firearms.  Emotional well-being and how you cope with stress.  Relationship well-being.  Diet, exercise, and sleep habits.  Your sexual health and activity.  Your methods of birth control.  Your menstrual cycle.  Your pregnancy  history.  Screening You may have the following tests or measurements:  Height, weight, and BMI.  Blood pressure.  Lipid and cholesterol levels.  Tuberculosis skin test.  Skin exam.  Vision and hearing tests.  Screening test for hepatitis.  Screening tests for sexually transmitted diseases (STDs), if you are at risk.  BRCA-related cancer screening. This may be done if you have a family history of breast, ovarian, tubal, or peritoneal cancers.  Pelvic exam and Pap test. This may be done every 3 years starting at age 55.  Vaccines Your health care provider may recommend certain vaccines, such as:  Influenza vaccine. This is recommended every year.  Tetanus, diphtheria, and acellular pertussis (Tdap, Td) vaccine. You may need a Td booster every 10 years.  Varicella vaccine. You may need this if you have not been vaccinated.  HPV vaccine. If you are 14 or younger, you may need three doses over 6 months.  Measles, mumps, and rubella (MMR) vaccine. You may need at least one dose of MMR. You may also need a second dose.  Pneumococcal 13-valent conjugate (PCV13) vaccine. You may need this if you have certain conditions and were not previously vaccinated.  Pneumococcal polysaccharide (PPSV23) vaccine. You may need one or two doses if you smoke cigarettes or if you have certain conditions.  Meningococcal vaccine. One dose is recommended if you are age 67-21 years and a first-year college student living in a residence hall, or if you have one of several medical conditions. You may also need additional booster doses.  Hepatitis A vaccine. You may need this if you have certain conditions or if you travel or work  in places where you may be exposed to hepatitis A.  Hepatitis B vaccine. You may need this if you have certain conditions or if you travel or work in places where you may be exposed to hepatitis B.  Haemophilus influenzae type b (Hib) vaccine. You may need this if you have  certain risk factors.  Talk to your health care provider about which screenings and vaccines you need and how often you need them. What steps can I take to develop healthy behaviors?  Have regular preventive health care visits with your primary care physician and dentist.  Eat a healthy diet.  Drink enough fluid to keep your urine clear or pale yellow.  Stay active. Exercise at least 30 minutes 5 or more days of the week.  Use alcohol responsibly.  Maintain a healthy weight.  Do not use any products that contain nicotine, such as cigarettes, chewing tobacco, and e-cigarettes. If you need help quitting, ask your health care provider.  Do not use drugs.  Practice safe sex.  Use birth control (contraception) to prevent unwanted pregnancy. If you plan to become pregnant, see your health care provider for a pre-conception visit.  Find healthy ways to manage stress. How can I protect myself from injury? Injuries from violence or accidents are the leading cause of death among young adults and can often be prevented. Take these steps to help protect yourself:  Always wear your seat belt while driving or riding in a vehicle.  Do not drive if you have been drinking alcohol. Do not ride with someone who has been drinking.  Do not drive when you are tired or distracted. Do not text while driving.  Wear a helmet and other protective equipment during sports activities.  If you have firearms in your house, make sure you follow all gun safety procedures.  Seek help if you have been bullied, physically abused, or sexually abused.  Use the Internet responsibly to avoid dangers such as online bullying and online sexual predators.  What can I do to cope with stress? Young adults may face many new challenges that can be stressful, such as finding a job, going to college, moving away from home, managing money, being in a relationship, getting married, and having children. To manage  stress:  Avoid known stressful situations when you can.  Exercise regularly.  Find a stress-reducing activity that works best for you. Examples include meditation, yoga, listening to music, or reading.  Spend time in nature.  Keep a journal to write about your stress and how you respond.  Talk to your health care provider about stress. He or she may suggest counseling.  Spend time with supportive friends or family.  Do not cope with stress by: ? Drinking alcohol or using drugs. ? Smoking cigarettes. ? Eating.  Where can I get more information? Learn more about preventive care and healthy habits from:  [place] and Gynecologists: KaraokeExchange.nl  U.S. Probation officer Task Force: StageSync.si  National Adolescent and [place]: StrategicRoad.nl  American Academy of Pediatrics Bright Futures: https://brightfutures.MemberVerification.co.za  Society for Adolescent Health and Medicine: MoralBlog.co.za.aspx  PodExchange.nl: ToyLending.fr  This information is not intended to replace advice given to you by your health care provider. Make sure you discuss any questions you have with your health care provider. Document Released: 10/01/2015 Document Revised: 10/22/2015 Document Reviewed: 10/01/2015 Elsevier Interactive Patient Education  2017 [employer].

## 2017-05-15 NOTE — Patient Instructions (Addendum)
Take medications as prescribed.  Do not use the steroid cream longer than 2 weeks at a time.   eucerin twice.   Pat dry after a shower.  Moisturize directly after you show.  Make sure this shower is not very hot.  Please review below.   Eczema Eczema, also called atopic dermatitis, is a skin disorder that causes inflammation of the skin. It causes a red rash and dry, scaly skin. The skin becomes very itchy. Eczema is generally worse during the cooler winter months and often improves with the warmth of summer. Eczema usually starts showing signs in infancy. Some children outgrow eczema, but it may last through adulthood. What are the causes? The exact cause of eczema is not known, but it appears to run in families. People with eczema often have a family history of eczema, allergies, asthma, or hay fever. Eczema is not contagious. Flare-ups of the condition may be caused by:  Contact with something you are sensitive or allergic to.  Stress.  What are the signs or symptoms?  Dry, scaly skin.  Red, itchy rash.  Itchiness. This may occur before the skin rash and may be very intense. How is this diagnosed? The diagnosis of eczema is usually made based on symptoms and medical history. How is this treated? Eczema cannot be cured, but symptoms usually can be controlled with treatment and other strategies. A treatment plan might include:  Controlling the itching and scratching. ? Use over-the-counter antihistamines as directed for itching. This is especially useful at night when the itching tends to be worse. ? Use over-the-counter steroid creams as directed for itching. ? Avoid scratching. Scratching makes the rash and itching worse. It may also result in a skin infection (impetigo) due to a break in the skin caused by scratching.  Keeping the skin well moisturized with creams every day. This will seal in moisture and help prevent dryness. Lotions that contain alcohol and water should be  avoided because they can dry the skin.  Limiting exposure to things that you are sensitive or allergic to (allergens).  Recognizing situations that cause stress.  Developing a plan to manage stress.  Follow these instructions at home:  Only take over-the-counter or prescription medicines as directed by your health care provider.  Do not use anything on the skin without checking with your health care provider.  Keep baths or showers short (5 minutes) in warm (not hot) water. Use mild cleansers for bathing. These should be unscented. You may add nonperfumed bath oil to the bath water. It is best to avoid soap and bubble bath.  Immediately after a bath or shower, when the skin is still damp, apply a moisturizing ointment to the entire body. This ointment should be a petroleum ointment. This will seal in moisture and help prevent dryness. The thicker the ointment, the better. These should be unscented.  Keep fingernails cut short. Children with eczema may need to wear soft gloves or mittens at night after applying an ointment.  Dress in clothes made of cotton or cotton blends. Dress lightly, because heat increases itching.  A child with eczema should stay away from anyone with fever blisters or cold sores. The virus that causes fever blisters (herpes simplex) can cause a serious skin infection in children with eczema. Contact a health care provider if:  Your itching interferes with sleep.  Your rash gets worse or is not better within 1 week after starting treatment.  You see pus or soft yellow scabs in the  rash area.  You have a fever.  You have a rash flare-up after contact with someone who has fever blisters. This information is not intended to replace advice given to you by your health care provider. Make sure you discuss any questions you have with your health care provider. Document Released: 05/13/2000 Document Revised: 10/22/2015 Document Reviewed: 12/17/2012 Elsevier  Interactive Patient Education  2017 [employer]    IF you received an x-ray today, you will receive an invoice from [facility]. Please contact [facility] at [phone] with questions or concerns regarding your invoice.   IF you received labwork today, you will receive an invoice from [facility]. Please contact [facility] at [phone] with questions or concerns regarding your invoice.   Our billing staff will not be able to assist you with questions regarding bills from these companies.  You will be contacted with the lab results as soon as they are available. The fastest way to get your results is to activate your My Chart account. Instructions are located on the last page of this paperwork. If you have not heard from us regarding the results in 2 weeks, please contact this office.

## 2017-05-15 NOTE — Progress Notes (Signed)
[facility] [address], [place] [phone]  Date:  05/15/2017   Name:  Stacy OkBrandi Mills   DOB:  10/04/1995   MRN:  [id number]  PCP:  Anne NgNche, Charlotte Lum, NP    History of Present Illness:  Stacy Mills is a 21 y.o. female patient who presents to PCP with  Chief Complaint  Patient presents with  . Follow-up    rash/ still itching. pt was seen a Lebaur this am and had bloodwork     The rash looks better, but she continues to have the rash for 4 weeks.  She has pruritus.  She can not say it is association with dryness.  She is using the eucerin which is helping, as well as the steroid, but it never clears completely. She has bumps that arise on her thighs, back, and arms.  Nothing in the webbing her her hands. No pets. Recently started a new job about 1-2 months ago at [employer].   She has never had a rash like this before.         Patient Active Problem List   Diagnosis Date Noted  . Idiopathic intracranial hypertension   . Migraines 12/22/2014  . PID (acute pelvic inflammatory disease) 11/07/2012  . Hemorrhagic cyst of ovary 11/07/2012  . Severe obesity (BMI >= 40) (HCC) 08/26/2012    Past Medical History:  Diagnosis Date  . Allergy   . Anxiety   . Depression   . IIH (idiopathic intracranial hypertension)   . IUD (intrauterine device) in place    Paraguard placed 2014, removed 10/2015  . Migraine   . Pelvic inflammatory disease 10/2012    Past Surgical History:  Procedure Laterality Date  . BRAIN SURGERY    . NO PAST SURGERIES      Social History   Tobacco Use  . Smoking status: Never Smoker  . Smokeless tobacco: Never Used  Substance Use Topics  . Alcohol use: Yes    Alcohol/week: 1.2 oz    Types: 2 Shots of liquor per week    Comment: social  . Drug use: No    Family History  Problem Relation Age of Onset  . Emphysema Maternal Grandfather   . Depression Maternal Grandfather   . Anxiety disorder Maternal Grandfather    . Mental illness Maternal Grandfather   . Diabetes Maternal Grandfather   . Cancer Paternal Grandmother   . Non-Hodgkin's lymphoma Paternal Grandmother   . Sudden death Paternal Grandmother   . Asthma Mother   . Hypertension Father   . Alcohol abuse Maternal Aunt   . Hyperlipidemia Maternal Aunt     Allergies  Allergen Reactions  . Gadolinium Derivatives Itching, Nausea Only and Cough    Medication list has been reviewed and updated.  Current Outpatient Medications on File Prior to Visit  Medication Sig Dispense Refill  . acetaminophen (TYLENOL) 325 MG tablet Take 650 mg by mouth every 6 (six) hours as needed.    Marland Kitchen. acetaZOLAMIDE (DIAMOX) 500 MG capsule Take 1 capsule (500 mg total) by mouth daily. 90 capsule 4  . betamethasone dipropionate (DIPROLENE) 0.05 % cream Apply topically 2 (two) times daily. 30 g 0  . DiphenhydrAMINE HCl (BENADRYL PO) Take by mouth.    Marland Kitchen. ibuprofen (ADVIL,MOTRIN) 200 MG tablet Take 200 mg by mouth every 8 (eight) hours as needed.    . mupirocin ointment (BACTROBAN) 2 % Apply 1 application topically 3 (three) times daily. 30 g 0  . nystatin cream (MYCOSTATIN)  Apply 1 application 2 (two) times daily topically. 30 g 0   No current facility-administered medications on file prior to visit.     ROS ROS otherwise unremarkable unless listed above.  Physical Examination: BP 118/62   Pulse 100   Temp 98.2 F (36.8 C) (Oral)   Resp 16   Ht 5\' 6"  (1.676 m)   Wt 274 lb (124.3 kg)   LMP 05/07/2017   SpO2 97%   BMI 44.22 kg/m  Ideal Body Weight: Weight in (lb) to have BMI = 25: 154.6  Physical Exam  Constitutional: She is oriented to person, place, and time. She appears well-developed and well-nourished. No distress.  HENT:  Head: Normocephalic and atraumatic.  Right Ear: External ear normal.  Left Ear: External ear normal.  Eyes: Conjunctivae and EOM are normal. Pupils are equal, round, and reactive to light.  Cardiovascular: Normal rate.   Pulmonary/Chest: Effort normal. No respiratory distress.  Neurological: She is alert and oriented to person, place, and time.  Skin: She is not diaphoretic.  Scant papular rash, some with hairs.  No tenderness.  No scaling at this time.  Nothing seen at the interwebbing or the flexor regions of the body.    Psychiatric: She has a normal mood and affect. Her behavior is normal.     Assessment and Plan: Stacy Mills is a 21 y.o. female who is here today for cc of  Chief Complaint  Patient presents with  . Follow-up    rash/ still itching. pt was seen a Lebaur this am and had bloodwork   She has a dermatology appointment in about 1 month.  I will start a short stint of antibiotic.  She can continue with steroid cream/moisturizer, but advised precautions and discontinuation after 2 weeks of consistent use.  Given instruction for eczema. Follow up in 10 days.  2nd gen antihistmaine also given.  Rash and nonspecific skin eruption - Plan: cetirizine (ZYRTEC) 10 MG tablet, betamethasone dipropionate (DIPROLENE) 0.05 % cream, doxycycline (VIBRAMYCIN) 100 MG capsule, POCT urine pregnancy  Trena PlattStephanie English, PA-C [facility] 12/17/20181:42 PM

## 2017-05-16 LAB — URINE CYTOLOGY ANCILLARY ONLY
CHLAMYDIA, DNA PROBE: NEGATIVE
NEISSERIA GONORRHEA: NEGATIVE

## 2017-05-24 ENCOUNTER — Encounter: Payer: Self-pay | Admitting: Physician Assistant

## 2017-05-29 ENCOUNTER — Ambulatory Visit: Payer: BLUE CROSS/BLUE SHIELD | Admitting: Physician Assistant

## 2017-06-12 ENCOUNTER — Ambulatory Visit: Payer: BLUE CROSS/BLUE SHIELD | Admitting: Physician Assistant

## 2017-06-20 ENCOUNTER — Ambulatory Visit (INDEPENDENT_AMBULATORY_CARE_PROVIDER_SITE_OTHER): Payer: Commercial Managed Care - PPO | Admitting: Family Medicine

## 2017-06-20 ENCOUNTER — Other Ambulatory Visit: Payer: Self-pay

## 2017-06-20 ENCOUNTER — Encounter: Payer: Self-pay | Admitting: Family Medicine

## 2017-06-20 VITALS — BP 118/94 | HR 93 | Temp 97.8°F | Ht 66.0 in | Wt 279.0 lb

## 2017-06-20 DIAGNOSIS — J309 Allergic rhinitis, unspecified: Secondary | ICD-10-CM | POA: Diagnosis not present

## 2017-06-20 MED ORDER — CETIRIZINE HCL 10 MG PO TABS: 10.0000 mg | ORAL_TABLET | Freq: Every day | ORAL | 11 refills | Status: DC

## 2017-06-20 MED ORDER — TRIAMCINOLONE ACETONIDE 55 MCG/ACT NA AERO: 1.0000 | INHALATION_SPRAY | Freq: Two times a day (BID) | NASAL | 12 refills | Status: DC

## 2017-06-20 NOTE — Progress Notes (Signed)
1/22/20193:09 PM  Stacy Mills 11/26/1995, 22 y.o. female [id number]  Chief Complaint  Patient presents with  . URI    since last Friday    HPI:   Patient is a 22 y.o. female who presents today for 5 days of nasal congestion, clear rhinorrhea, sneezing, itchiness, mild cough and throat irritation. OTC cold medication not helping. Normally has allergies in the spring.  Denies any SOB, ear pain, sinus pain, nausea or vomiting, abdominal pain, fever or chills She endorses diarrhea, but think that is related to menses.   Dep[facility] screen [facility] 2/9 06/20/2017 05/15/2017 05/15/2017  Decreased Interest 0 0 0  Down, Depressed, Hopeless 0 0 0  PHQ - 2 Score 0 0 0    Allergies  Allergen Reactions  . Gadolinium Derivatives Itching, Nausea Only and Cough    Prior to Admission medications   Medication Sig Start Date End Date Taking? Authorizing Provider  acetaminophen (TYLENOL) 325 MG tablet Take 650 mg by mouth every 6 (six) hours as needed.   Yes [provider]  acetaZOLAMIDE (DIAMOX) 500 MG capsule Take 1 capsule (500 mg total) by mouth daily. 01/31/17  Yes Penumalli, Glenford BayleyVikram R, MD  betamethasone dipropionate (DIPROLENE) 0.05 % cream Apply topically 2 (two) times daily. 05/15/17  Yes Garnetta BuddyEnglish, Stephanie D, PA    Past Medical History:  Diagnosis Date  . Allergy   . Anxiety   . Depression   . IIH (idiopathic intracranial hypertension)   . IUD (intrauterine device) in place    Paraguard placed 2014, removed 10/2015  . Migraine   . Pelvic inflammatory disease 10/2012    Past Surgical History:  Procedure Laterality Date  . BRAIN SURGERY    . NO PAST SURGERIES      Social History   Tobacco Use  . Smoking status: Never Smoker  . Smokeless tobacco: Never Used  Substance Use Topics  . Alcohol use: Yes    Alcohol/week: 1.2 oz    Types: 2 Shots of liquor per week    Comment: social    Family History  Problem Relation Age of Onset  . Emphysema Maternal Grandfather    . Depression Maternal Grandfather   . Anxiety disorder Maternal Grandfather   . Mental illness Maternal Grandfather   . Diabetes Maternal Grandfather   . Cancer Paternal Grandmother   . Non-Hodgkin's lymphoma Paternal Grandmother   . Sudden death Paternal Grandmother   . Asthma Mother   . Hypertension Father   . Alcohol abuse Maternal Aunt   . Hyperlipidemia Maternal Aunt     ROS Per hpi  OBJECTIVE:  Blood pressure (!) 118/94, pulse 93, temperature 97.8 F (36.6 C), temperature source Oral, height 5\' 6"  (1.676 m), weight 279 lb (126.6 kg), last menstrual period 05/30/2017, SpO2 98 %.  Physical Exam  Constitutional: She is oriented to person, place, and time and well-developed, well-nourished, and in no distress.  HENT:  Head: Normocephalic and atraumatic.  Right Ear: Hearing, tympanic membrane, external ear and ear canal normal.  Left Ear: Hearing, tympanic membrane, external ear and ear canal normal.  Nose: Mucosal edema and rhinorrhea present. Right sinus exhibits no maxillary sinus tenderness and no frontal sinus tenderness. Left sinus exhibits no maxillary sinus tenderness and no frontal sinus tenderness.  Mouth/Throat: Mucous membranes are normal. Posterior oropharyngeal erythema (mild erythema with cobblestoning) present. No oropharyngeal exudate.  Eyes: EOM are normal. Pupils are equal, round, and reactive to light.  Neck: Neck supple.  Cardiovascular: Normal rate, regular rhythm and  normal heart sounds. Exam reveals no gallop and no friction rub.  No murmur heard. Pulmonary/Chest: Effort normal and breath sounds normal. She has no wheezes. She has no rales.  Lymphadenopathy:    She has no cervical adenopathy.  Neurological: She is alert and oriented to person, place, and time. Gait normal.  Skin: Skin is warm and dry.    ASSESSMENT and PLAN  1. Allergic rhinitis, unspecified seasonality, unspecified trigger Discussed supportive measures, new meds r/se/b and RTC  precautions. Patient educational handout given. - triamcinolone (NASACORT) 55 MCG/ACT AERO nasal inhaler; Place 1 spray into the nose 2 (two) times daily. - cetirizine (ZYRTEC) 10 MG tablet; Take 1 tablet (10 mg total) by mouth daily.  Return if symptoms worsen or fail to improve.    Myles Lipps, MD Primary Care at [facility] [address] [place], [place] [address] Ph.  [phone] Fax [phone]

## 2017-06-20 NOTE — Patient Instructions (Addendum)
1. Once symptoms are controlled decrease nasal spray to 1 once a day 2. Consider adding oral nasal decongestant such as phenylephrine, 10 mg every 4 hours as needed  Allergic Rhinitis, Adult Allergic rhinitis is an allergic reaction that affects the mucous membrane inside the nose. It causes sneezing, a runny or stuffy nose, and the feeling of mucus going down the back of the throat (postnasal drip). Allergic rhinitis can be mild to severe. There are two types of allergic rhinitis:  Seasonal. This type is also called hay fever. It happens only during certain seasons.  Perennial. This type can happen at any time of the year.  What are the causes? This condition happens when the body's defense system (immune system) responds to certain harmless substances called allergens as though they were germs.  Seasonal allergic rhinitis is triggered by pollen, which can come from grasses, trees, and weeds. Perennial allergic rhinitis may be caused by:  House dust mites.  Pet dander.  Mold spores.  What are the signs or symptoms? Symptoms of this condition include:  Sneezing.  Runny or stuffy nose (nasal congestion).  Postnasal drip.  Itchy nose.  Tearing of the eyes.  Trouble sleeping.  Daytime sleepiness.  How is this diagnosed? This condition may be diagnosed based on:  Your medical history.  A physical exam.  Tests to check for related conditions, such as: ? Asthma. ? Pink eye. ? Ear infection. ? Upper respiratory infection.  Tests to find out which allergens trigger your symptoms. These may include skin or blood tests.  How is this treated? There is no cure for this condition, but treatment can help control symptoms. Treatment may include:  Taking medicines that block allergy symptoms, such as antihistamines. Medicine may be given as a shot, nasal spray, or pill.  Avoiding the allergen.  Desensitization. This treatment involves getting ongoing shots until your  body becomes less sensitive to the allergen. This treatment may be done if other treatments do not help.  If taking medicine and avoiding the allergen does not work, new, stronger medicines may be prescribed.  Follow these instructions at home:  Find out what you are allergic to. Common allergens include smoke, dust, and pollen.  Avoid the things you are allergic to. These are some things you can do to help avoid allergens: ? Replace carpet with wood, tile, or vinyl flooring. Carpet can trap dander and dust. ? Do not smoke. Do not allow smoking in your home. ? Change your heating and air conditioning filter at least once a month. ? During allergy season:  Keep windows closed as much as possible.  Plan outdoor activities when pollen counts are lowest. This is usually during the evening hours.  When coming indoors, change clothing and shower before sitting on furniture or bedding.  Take over-the-counter and prescription medicines only as told by your health care provider.  Keep all follow-up visits as told by your health care provider. This is important. Contact a health care provider if:  You have a fever.  You develop a persistent cough.  You make whistling sounds when you breathe (you wheeze).  Your symptoms interfere with your normal daily activities. Get help right away if:  You have shortness of breath. Summary  This condition can be managed by taking medicines as directed and avoiding allergens.  Contact your health care provider if you develop a persistent cough or fever.  During allergy season, keep windows closed as much as possible. This information is not intended  to replace advice given to you by your health care provider. Make sure you discuss any questions you have with your health care provider. Document Released: 02/08/2001 Document Revised: 06/23/2016 Document Reviewed: 06/23/2016 Elsevier Interactive Patient Education  2018 [employer]    IF you  received an x-ray today, you will receive an invoice from [facility]. Please contact [facility] at [phone] with questions or concerns regarding your invoice.   IF you received labwork today, you will receive an invoice from [facility]. Please contact [facility] at [phone] with questions or concerns regarding your invoice.   Our billing staff will not be able to assist you with questions regarding bills from these companies.  You will be contacted with the lab results as soon as they are available. The fastest way to get your results is to activate your My Chart account. Instructions are located on the last page of this paperwork. If you have not heard from us regarding the results in 2 weeks, please contact this office.

## 2017-06-27 DIAGNOSIS — L309 Dermatitis, unspecified: Secondary | ICD-10-CM | POA: Diagnosis not present

## 2017-06-27 DIAGNOSIS — L0291 Cutaneous abscess, unspecified: Secondary | ICD-10-CM | POA: Diagnosis not present

## 2017-06-28 DIAGNOSIS — L0291 Cutaneous abscess, unspecified: Secondary | ICD-10-CM | POA: Diagnosis not present

## 2017-08-04 ENCOUNTER — Ambulatory Visit: Payer: BLUE CROSS/BLUE SHIELD | Admitting: Diagnostic Neuroimaging

## 2017-08-25 DIAGNOSIS — L0291 Cutaneous abscess, unspecified: Secondary | ICD-10-CM | POA: Diagnosis not present

## 2017-08-25 DIAGNOSIS — L309 Dermatitis, unspecified: Secondary | ICD-10-CM | POA: Diagnosis not present

## 2017-09-06 ENCOUNTER — Encounter: Payer: Self-pay | Admitting: Physician Assistant

## 2017-09-06 ENCOUNTER — Telehealth: Payer: Self-pay | Admitting: Diagnostic Neuroimaging

## 2017-09-06 NOTE — Telephone Encounter (Signed)
Pt got a job and could only be seen on Tuesdays. She has appt with Eber Jonesarolyn on 10/10/17. This is a 6 mth f/u, Dr Marjory LiesPenumalli patient.  FYI

## 2017-09-06 NOTE — Telephone Encounter (Signed)
Noted  

## 2017-09-07 ENCOUNTER — Emergency Department (HOSPITAL_COMMUNITY): Payer: Commercial Managed Care - PPO

## 2017-09-07 ENCOUNTER — Emergency Department (HOSPITAL_COMMUNITY)
Admission: EM | Admit: 2017-09-07 | Discharge: 2017-09-07 | Disposition: A | Discharge status: Home / Self Care | Payer: Commercial Managed Care - PPO | Attending: Emergency Medicine | Admitting: Emergency Medicine

## 2017-09-07 ENCOUNTER — Encounter (HOSPITAL_COMMUNITY): Payer: Self-pay | Admitting: Emergency Medicine

## 2017-09-07 ENCOUNTER — Other Ambulatory Visit: Payer: Self-pay

## 2017-09-07 DIAGNOSIS — G932 Benign intracranial hypertension: Secondary | ICD-10-CM | POA: Diagnosis not present

## 2017-09-07 DIAGNOSIS — R110 Nausea: Secondary | ICD-10-CM | POA: Insufficient documentation

## 2017-09-07 DIAGNOSIS — R1084 Generalized abdominal pain: Secondary | ICD-10-CM | POA: Diagnosis not present

## 2017-09-07 DIAGNOSIS — R109 Unspecified abdominal pain: Secondary | ICD-10-CM | POA: Diagnosis not present

## 2017-09-07 LAB — CBC
HEMATOCRIT: 37.5 % (ref 36.0–46.0)
Hemoglobin: 11.6 g/dL — ABNORMAL LOW (ref 12.0–15.0)
MCH: 27 pg (ref 26.0–34.0)
MCHC: 30.9 g/dL (ref 30.0–36.0)
MCV: 87.2 fL (ref 78.0–100.0)
Platelets: 335 10*3/uL (ref 150–400)
RBC: 4.3 MIL/uL (ref 3.87–5.11)
RDW: 13.6 % (ref 11.5–15.5)
WBC: 16.8 10*3/uL — AB (ref 4.0–10.5)

## 2017-09-07 LAB — COMPREHENSIVE METABOLIC PANEL
ALT: 15 U/L (ref 14–54)
AST: 20 U/L (ref 15–41)
Albumin: 3.8 g/dL (ref 3.5–5.0)
Alkaline Phosphatase: 72 U/L (ref 38–126)
Anion gap: 11 (ref 5–15)
BUN: 7 mg/dL (ref 6–20)
CO2: 22 mmol/L (ref 22–32)
Calcium: 9.4 mg/dL (ref 8.9–10.3)
Chloride: 103 mmol/L (ref 101–111)
Creatinine, Ser: 0.94 mg/dL (ref 0.44–1.00)
GFR calc Af Amer: >60 mL/min (ref >60)
Glucose, Bld: 86 mg/dL (ref 65–99)
POTASSIUM: 4.1 mmol/L (ref 3.5–5.1)
Sodium: 136 mmol/L (ref 135–145)
Total Bilirubin: 0.6 mg/dL (ref 0.3–1.2)
Total Protein: 7.6 g/dL (ref 6.5–8.1)

## 2017-09-07 LAB — I-STAT BETA HCG BLOOD, ED (MC, WL, AP ONLY)

## 2017-09-07 LAB — URINALYSIS, ROUTINE W REFLEX MICROSCOPIC
BILIRUBIN URINE: NEGATIVE
Glucose, UA: NEGATIVE mg/dL
HGB URINE DIPSTICK: NEGATIVE
Ketones, ur: NEGATIVE mg/dL
Nitrite: NEGATIVE
PROTEIN: NEGATIVE mg/dL
Specific Gravity, Urine: 1.017 (ref 1.005–1.030)
pH: 6 (ref 5.0–8.0)

## 2017-09-07 LAB — I-STAT CG4 LACTIC ACID, ED: Lactic Acid, Venous: 0.78 mmol/L (ref 0.5–1.9)

## 2017-09-07 LAB — LIPASE, BLOOD: Lipase: 25 U/L (ref 11–51)

## 2017-09-07 MED ORDER — IOPAMIDOL (ISOVUE-300) INJECTION 61%
INTRAVENOUS | Status: AC
  Filled 2017-09-07: qty 100

## 2017-09-07 MED ORDER — ONDANSETRON 8 MG PO TBDP: 8.0000 mg | ORAL_TABLET | Freq: Three times a day (TID) | ORAL | 0 refills | Status: DC | PRN

## 2017-09-07 MED ORDER — ACETAMINOPHEN 325 MG PO TABS
650.0000 mg | ORAL_TABLET | Freq: Once | ORAL | Status: AC
  Administered 2017-09-07: 650 mg via ORAL
  Filled 2017-09-07: qty 2

## 2017-09-07 MED ORDER — IOPAMIDOL (ISOVUE-300) INJECTION 61%
100.0000 mL | Freq: Once | INTRAVENOUS | Status: AC | PRN
  Administered 2017-09-07: 100 mL via INTRAVENOUS

## 2017-09-07 NOTE — ED Provider Notes (Signed)
[facility] Provider Note   CSN: [id number] Arrival date & time: 09/07/17  1652     History   Chief Complaint Chief Complaint  Patient presents with  . Possible Pregnancy  . Abdominal Pain    HPI Stacy Mills is a 22 y.o. female.  HPI Patient is a 22 year old female presents the emergency department with complaints of waxing and waning crampy right-sided abdominal pain over the past several days.  She reports nausea without vomiting.  She denies dysuria or urinary frequency.  No diarrhea.  Her symptoms are mild to moderate in severity.  No fevers or chills.  No vomiting just nausea.  She denies diarrhea.  No new vaginal complaints.  Denies back pain.  No recent sick contacts.  She has not tried any medication for symptoms.  Nothing seems to worsen her symptoms.  Past Medical History:  Diagnosis Date  . Allergy   . Anxiety   . Depression   . IIH (idiopathic intracranial hypertension)   . IUD (intrauterine device) in place    Paraguard placed 2014, removed 10/2015  . Migraine   . Pelvic inflammatory disease 10/2012    Patient Active Problem List   Diagnosis Date Noted  . Idiopathic intracranial hypertension   . Migraines 12/22/2014  . PID (acute pelvic inflammatory disease) 11/07/2012  . Hemorrhagic cyst of ovary 11/07/2012  . Severe obesity (BMI >= 40) (HCC) 08/26/2012    Past Surgical History:  Procedure Laterality Date  . BRAIN SURGERY    . NO PAST SURGERIES       OB History    Gravida  0   Para      Term      Preterm      AB  0   Living  0     SAB      TAB      Ectopic  0   Multiple      Live Births               Home Medications    Prior to Admission medications   Medication Sig Start Date End Date Taking? Authorizing Provider  acetaminophen (TYLENOL) 325 MG tablet Take 650 mg by mouth every 6 (six) hours as needed.    [provider]  acetaZOLAMIDE (DIAMOX) 500 MG capsule Take 1  capsule (500 mg total) by mouth daily. 01/31/17   Penumalli, Glenford BayleyVikram R, MD  betamethasone dipropionate (DIPROLENE) 0.05 % cream Apply topically 2 (two) times daily. 05/15/17   Trena PlattEnglish, Stephanie D, PA  cetirizine (ZYRTEC) 10 MG tablet Take 1 tablet (10 mg total) by mouth daily. 06/20/17   Myles LippsSantiago, Irma M, MD  ondansetron (ZOFRAN ODT) 8 MG disintegrating tablet Take 1 tablet (8 mg total) by mouth every 8 (eight) hours as needed for nausea or vomiting. 09/07/17   Azalia Bilisampos, Indy Kuck, MD  triamcinolone (NASACORT) 55 MCG/ACT AERO nasal inhaler Place 1 spray into the nose 2 (two) times daily. 06/20/17   Myles LippsSantiago, Irma M, MD    Family History Family History  Problem Relation Age of Onset  . Emphysema Maternal Grandfather   . Depression Maternal Grandfather   . Anxiety disorder Maternal Grandfather   . Mental illness Maternal Grandfather   . Diabetes Maternal Grandfather   . Cancer Paternal Grandmother   . Non-Hodgkin's lymphoma Paternal Grandmother   . Sudden death Paternal Grandmother   . Asthma Mother   . Hypertension Father   . Alcohol abuse Maternal Aunt   .  Hyperlipidemia Maternal Aunt     Social History Social History   Tobacco Use  . Smoking status: Never Smoker  . Smokeless tobacco: Never Used  Substance Use Topics  . Alcohol use: Yes    Alcohol/week: 1.2 oz    Types: 2 Shots of liquor per week    Comment: social  . Drug use: No    Types: Other-see comments, Marijuana     Allergies   Gadolinium derivatives   Review of Systems Review of Systems  All other systems reviewed and are negative.    Physical Exam Updated Vital Signs BP 117/88   Pulse (!) 104   Temp 98.6 F (37 C) (Oral)   Resp 18   Ht 5\' 6"  (1.676 m)   Wt 122.9 kg (271 lb)   LMP 08/12/2017 (Exact Date)   SpO2 100%   BMI 43.74 kg/m   Physical Exam  Constitutional: She is oriented to person, place, and time. She appears well-developed and well-nourished. No distress.  HENT:  Head: Normocephalic and  atraumatic.  Eyes: EOM are normal.  Neck: Normal range of motion.  Cardiovascular: Normal rate, regular rhythm and normal heart sounds.  Pulmonary/Chest: Effort normal and breath sounds normal.  Abdominal: Soft. She exhibits no distension. There is no tenderness.  Musculoskeletal: Normal range of motion.  Neurological: She is alert and oriented to person, place, and time.  Skin: Skin is warm and dry.  Psychiatric: She has a normal mood and affect. Judgment normal.  Nursing note and vitals reviewed.    ED Treatments / Results  Labs (all labs ordered are listed, but only abnormal results are displayed) Labs Reviewed  CBC - Abnormal; Notable for the following components:      Result Value   WBC 16.8 (*)    Hemoglobin 11.6 (*)    All other components within normal limits  URINALYSIS, ROUTINE W REFLEX MICROSCOPIC - Abnormal; Notable for the following components:   APPearance CLOUDY (*)    Leukocytes, UA SMALL (*)    Bacteria, UA RARE (*)    Squamous Epithelial / LPF TOO NUMEROUS TO COUNT (*)    All other components within normal limits  LIPASE, BLOOD  COMPREHENSIVE METABOLIC PANEL  I-STAT BETA HCG BLOOD, ED ([facility], [facility], [facility] ONLY)  I-STAT CG4 LACTIC ACID, ED  I-STAT CG4 LACTIC ACID, ED    EKG None  Radiology Ct Abdomen Pelvis W Contrast  Result Date: 09/07/2017 CLINICAL DATA:  Initial evaluation for acute generalized abdominal pain for 1 week. EXAM: CT ABDOMEN AND PELVIS WITH CONTRAST TECHNIQUE: Multidetector CT imaging of the abdomen and pelvis was performed using the standard protocol following bolus administration of intravenous contrast. CONTRAST:  ISOVUE-300 IOPAMIDOL (ISOVUE-300) INJECTION 61% COMPARISON:  None. FINDINGS: Lower chest: Visualized lung bases are clear. Hepatobiliary: Liver demonstrates a normal contrast enhanced appearance. Gallbladder within normal limits. No biliary dilatation. Pancreas: Unremarkable. Spleen: Unremarkable. Adrenals/Urinary Tract: Adrenal  glands are normal. Kidneys equal in size with symmetric enhancement. No nephrolithiasis, hydronephrosis, or focal enhancing renal mass. No hydroureter. Partially distended bladder within normal limits. Stomach/Bowel: Stomach within normal limits. No evidence for bowel obstruction. No acute inflammatory changes seen about the bowels. Appendix within normal limits. Vascular/Lymphatic: Normal intravascular enhancement seen throughout the intra-abdominal aorta. Mesenteric vessels patent proximally. No adenopathy. Reproductive: Uterus and ovaries within normal limits for age. Other: No free air or fluid. Musculoskeletal: No acute osseous abnormality. No worrisome lytic or blastic osseous lesions. IMPRESSION: No CT evidence for acute intra-abdominal or pelvic process.  Electronically Signed   By: Rise Mu M.D.   On: 09/07/2017 22:14    Procedures Procedures (including critical care time)  Medications Ordered in ED Medications  acetaminophen (TYLENOL) tablet 650 mg (650 mg Oral Given 09/07/17 1812)  iopamidol (ISOVUE-300) 61 % injection 100 mL (100 mLs Intravenous Contrast Given 09/07/17 2148)     Initial Impression / Assessment and Plan / ED Course  I have reviewed the triage vital signs and the nursing notes.  Pertinent labs & imaging results that were available during my care of the patient were reviewed by me and considered in my medical decision making (see chart for details).     No significant tenderness on my examination.  CT imaging was performed prior to my evaluation.  CT demonstrates no acute intra-abdominal or intrapelvic pathology.  Likely viral symptoms given her nausea and crampy abdominal pain.  She may develop diarrhea in the next 24-48 hours.  She appears hydrated at this time.  Discharged home in good condition.  Primary care follow-up.  Patient understands return to the ER for new or worsening symptoms.  Final Clinical Impressions(s) / ED Diagnoses   Final diagnoses:    Abdominal pain, unspecified abdominal location  Nausea    ED Discharge Orders        Ordered    ondansetron (ZOFRAN ODT) 8 MG disintegrating tablet  Every 8 hours PRN     09/07/17 2221       Azalia Bilis, MD 09/07/17 2307

## 2017-09-07 NOTE — ED Triage Notes (Signed)
Pt states LMP was 08/12/17 and that she thinks she is pregnant, reporting occasional nausea and dizziness, fatigue, and back aches. Pt reports abdominal pain further into assessment.

## 2017-09-07 NOTE — ED Notes (Signed)
Pt transported to CT ?

## 2017-09-07 NOTE — ED Provider Notes (Signed)
Patient placed in Quick Look pathway, seen and evaluated   Chief Complaint: abdominal pain  HPI:    Stacy Mills is a 22 y.o. female, presenting to the ED with abdominal pain for the last week.  Pain is waxing and waning, cramping and sharp, RLQ and suprapubic, moderate to severe.  Also endorses bilateral lower back discomfort, nausea, and loss of appetite.  Throughout interview, patient is preoccupied with thinking she may be pregnant.  When asked why she thought this, she states, "It just seems like I am."  Patient denies any previous pregnancies.  LMP March 16.  ROS: Abdominal pain (one)  Physical Exam:   Gen: No distress  Neuro: Awake and Alert  Skin: Warm    Focused Exam:   No diaphoresis.  No pallor.  Pulmonary: No increased work of breathing.  Speaks in full sentences without difficulty.  Lung sounds clear.  No tachypnea.  Cardiac: Normal rate and regular. Peripheral pulses intact.  Abdominal: Tenderness noted to right lower quadrant and suprapubic regions.  No peritoneal signs.  No rebound tenderness.  No guarding.  No CVA tenderness.  MSK: No peripheral edema.   Initiation of care has begun. The patient has been counseled on the process, plan, and necessity for staying for the completion/evaluation, and the remainder of the medical screening examination   Anselm PancoastJoy, Stacy Mills, Cordelia Poche-Mills 09/07/17 1828

## 2017-09-08 ENCOUNTER — Ambulatory Visit: Payer: Commercial Managed Care - PPO | Admitting: Family Medicine

## 2017-09-08 ENCOUNTER — Emergency Department (HOSPITAL_COMMUNITY)
Admission: EM | Admit: 2017-09-08 | Discharge: 2017-09-08 | Disposition: A | Discharge status: Home / Self Care | Payer: Commercial Managed Care - PPO | Attending: Emergency Medicine | Admitting: Emergency Medicine

## 2017-09-08 ENCOUNTER — Encounter (HOSPITAL_COMMUNITY): Payer: Self-pay | Admitting: *Deleted

## 2017-09-08 DIAGNOSIS — Z79899 Other long term (current) drug therapy: Secondary | ICD-10-CM | POA: Insufficient documentation

## 2017-09-08 DIAGNOSIS — J029 Acute pharyngitis, unspecified: Secondary | ICD-10-CM | POA: Diagnosis present

## 2017-09-08 DIAGNOSIS — J020 Streptococcal pharyngitis: Secondary | ICD-10-CM | POA: Diagnosis not present

## 2017-09-08 LAB — GROUP A STREP BY PCR: Group A Strep by PCR: DETECTED — AB

## 2017-09-08 MED ORDER — ACETAMINOPHEN 325 MG PO TABS
650.0000 mg | ORAL_TABLET | Freq: Once | ORAL | Status: AC | PRN
  Administered 2017-09-08: 650 mg via ORAL
  Filled 2017-09-08: qty 2

## 2017-09-08 MED ORDER — PREDNISONE 20 MG PO TABS
60.0000 mg | ORAL_TABLET | Freq: Once | ORAL | Status: AC
  Administered 2017-09-08: 60 mg via ORAL
  Filled 2017-09-08: qty 3

## 2017-09-08 MED ORDER — PENICILLIN G BENZATHINE 1200000 UNIT/2ML IM SUSP
1.2000 10*6.[IU] | Freq: Once | INTRAMUSCULAR | Status: AC
  Administered 2017-09-08: 1.2 10*6.[IU] via INTRAMUSCULAR
  Filled 2017-09-08: qty 2

## 2017-09-08 NOTE — ED Triage Notes (Signed)
Pt in c/o sore throat that started a few days ago, pain worse today

## 2017-09-08 NOTE — Discharge Instructions (Signed)
Please read instructions below. You have been treated today with antibiotics for strep throat.  You can take tylenol and ibuprofen for sore throat or body aches. Drink plenty of water.  Follow up with your primary care provider if symptoms persist.  Return to the ER for difficulty swallowing liquids, difficulty breathing, or new or worsening symptoms.

## 2017-09-08 NOTE — ED Provider Notes (Signed)
[facility] EMERGENCY DEPARTMENT Provider Note   CSN: [id number] Arrival date & time: 09/08/17  0930     History   Chief Complaint Chief Complaint  Patient presents with  . Sore Throat    HPI Stacy Mills is a 22 y.o. female presenting to the ED with a few days of worsening sore throat.  HPI  Past Medical History:  Diagnosis Date  . Allergy   . Anxiety   . Depression   . IIH (idiopathic intracranial hypertension)   . IUD (intrauterine device) in place    Paraguard placed 2014, removed 10/2015  . Migraine   . Pelvic inflammatory disease 10/2012    Patient Active Problem List   Diagnosis Date Noted  . Idiopathic intracranial hypertension   . Migraines 12/22/2014  . PID (acute pelvic inflammatory disease) 11/07/2012  . Hemorrhagic cyst of ovary 11/07/2012  . Severe obesity (BMI >= 40) (HCC) 08/26/2012    Past Surgical History:  Procedure Laterality Date  . BRAIN SURGERY    . NO PAST SURGERIES       OB History    Gravida  0   Para      Term      Preterm      AB  0   Living  0     SAB      TAB      Ectopic  0   Multiple      Live Births               Home Medications    Prior to Admission medications   Medication Sig Start Date End Date Taking? Authorizing Provider  acetaminophen (TYLENOL) 325 MG tablet Take 650 mg by mouth every 6 (six) hours as needed.    [provider]  acetaZOLAMIDE (DIAMOX) 500 MG capsule Take 1 capsule (500 mg total) by mouth daily. 01/31/17   Penumalli, Glenford Bayley, MD  betamethasone dipropionate (DIPROLENE) 0.05 % cream Apply topically 2 (two) times daily. 05/15/17   Trena Platt D, PA  cetirizine (ZYRTEC) 10 MG tablet Take 1 tablet (10 mg total) by mouth daily. 06/20/17   Myles Lipps, MD  ondansetron (ZOFRAN ODT) 8 MG disintegrating tablet Take 1 tablet (8 mg total) by mouth every 8 (eight) hours as needed for nausea or vomiting. 09/07/17   Azalia Bilis, MD  triamcinolone  (NASACORT) 55 MCG/ACT AERO nasal inhaler Place 1 spray into the nose 2 (two) times daily. 06/20/17   Myles Lipps, MD    Family History Family History  Problem Relation Age of Onset  . Emphysema Maternal Grandfather   . Depression Maternal Grandfather   . Anxiety disorder Maternal Grandfather   . Mental illness Maternal Grandfather   . Diabetes Maternal Grandfather   . Cancer Paternal Grandmother   . Non-Hodgkin's lymphoma Paternal Grandmother   . Sudden death Paternal Grandmother   . Asthma Mother   . Hypertension Father   . Alcohol abuse Maternal Aunt   . Hyperlipidemia Maternal Aunt     Social History Social History   Tobacco Use  . Smoking status: Never Smoker  . Smokeless tobacco: Never Used  Substance Use Topics  . Alcohol use: Yes    Alcohol/week: 1.2 oz    Types: 2 Shots of liquor per week    Comment: social  . Drug use: No    Types: Other-see comments, Marijuana     Allergies   Gadolinium derivatives   Review of Systems Review of  Systems   Physical Exam Updated Vital Signs BP (!) 136/98 (BP Location: Right Arm)   Pulse (!) 109   Temp 98.5 F (36.9 C)   Resp 18   LMP 08/12/2017 (Exact Date)   SpO2 100%   Physical Exam  Constitutional: She appears well-developed and well-nourished. No distress.  Tolerating secretions  HENT:  Head: Normocephalic and atraumatic.  Right Ear: Tympanic membrane and ear canal normal.  Left Ear: Tympanic membrane and ear canal normal.  Mouth/Throat: Uvula is midline. No trismus in the jaw. No uvula swelling. Posterior oropharyngeal erythema present. No tonsillar abscesses. No tonsillar exudate.  Eyes: Conjunctivae are normal.  Neck: Normal range of motion. Neck supple.  Cardiovascular: Regular rhythm, normal heart sounds and intact distal pulses.  Pulmonary/Chest: Effort normal and breath sounds normal. No respiratory distress.  Lymphadenopathy:    She has cervical adenopathy.  Psychiatric: She has a normal mood  and affect. Her behavior is normal.  Nursing note and vitals reviewed.    ED Treatments / Results  Labs (all labs ordered are listed, but only abnormal results are displayed) Labs Reviewed  GROUP A STREP BY PCR - Abnormal; Notable for the following components:      Result Value   Group A Strep by PCR DETECTED (*)    All other components within normal limits    EKG None  Radiology   Procedures Procedures (including critical care time)  Medications Ordered in ED Medications  acetaminophen (TYLENOL) tablet 650 mg (650 mg Oral Given 09/08/17 0942)  penicillin g benzathine (BICILLIN LA) 1200000 UNIT/2ML injection 1.2 Million Units (1.2 Million Units Intramuscular Given 09/08/17 1106)  predniSONE (DELTASONE) tablet 60 mg (60 mg Oral Given 09/08/17 1105)     Initial Impression / Assessment and Plan / ED Course  I have reviewed the triage vital signs and the nursing notes.  Pertinent labs & imaging results that were available during my care of the patient were reviewed by me and considered in my medical decision making (see chart for details).     Pt presenting with sore throat x 3 days. On exam, pharyngeal and tonsillar erythema, cervical lymphadenopathy. Tolerating secretions. Diagnosis of strep via positive strep PCR. Treated in the ED with steroids, Pain medication and PCN IM.  Pt appears mildly dehydrated, discussed importance of water rehydration. Presentation non concerning for PTA or infxn spread to soft tissue. No trismus or uvula deviation. Specific return precautions discussed. Pt able to drink water in ED without difficulty with intact air way. Recommended PCP follow up.   Discussed results, findings, treatment and follow up. Patient advised of return precautions. Patient verbalized understanding and agreed with plan.  Final Clinical Impressions(s) / ED Diagnoses   Final diagnoses:  Strep pharyngitis    ED Discharge Orders    None       Mandell Pangborn, [place]Jordan N,  PA-C 09/08/17 1126    Mesner, Barbara CowerJason, MD 09/08/17 1643

## 2017-09-09 ENCOUNTER — Telehealth: Payer: Self-pay

## 2017-09-09 NOTE — Telephone Encounter (Signed)
Post ED Visit - Positive Culture Follow-up  Culture report reviewed by antimicrobial stewardship pharmacist:  []  Enzo BiNathan Batchelder, Pharm.D. []  Celedonio MiyamotoJeremy Frens, Pharm.D., BCPS AQ-ID []  Garvin FilaMike Maccia, Pharm.D., BCPS []  Georgina PillionElizabeth Martin, Pharm.D., BCPS []  Lake BungeeMinh Pham, [address] BoulevardPharm.D., BCPS, AAHIVP []  Estella HuskMichelle Turner, Pharm.D., BCPS, AAHIVP []  Lysle Pearlachel Rumbarger, PharmD, BCPS []  Blake DivineShannon Parkey, PharmD []  Pollyann SamplesAndy Johnston, PharmD, BCPS Sharin MonsEmily Sinclair Pharm D  Positive Strep culture Treated with PCN, organism sensitive to the same and no further patient follow-up is required at this time.  Jerry CarasCullom, Jeanne Terrance Burnett 09/09/2017, 10:26 AM

## 2017-09-11 ENCOUNTER — Ambulatory Visit: Payer: BLUE CROSS/BLUE SHIELD | Admitting: Diagnostic Neuroimaging

## 2017-10-10 ENCOUNTER — Ambulatory Visit: Payer: Commercial Managed Care - PPO | Admitting: Nurse Practitioner

## 2017-10-24 ENCOUNTER — Encounter: Payer: Self-pay | Admitting: Nurse Practitioner

## 2017-10-24 DIAGNOSIS — N390 Urinary tract infection, site not specified: Secondary | ICD-10-CM | POA: Diagnosis not present

## 2017-10-24 DIAGNOSIS — R300 Dysuria: Secondary | ICD-10-CM | POA: Diagnosis not present

## 2017-12-22 DIAGNOSIS — Z3201 Encounter for pregnancy test, result positive: Secondary | ICD-10-CM | POA: Diagnosis not present

## 2017-12-22 DIAGNOSIS — Z331 Pregnant state, incidental: Secondary | ICD-10-CM | POA: Diagnosis not present

## 2017-12-22 DIAGNOSIS — R110 Nausea: Secondary | ICD-10-CM | POA: Diagnosis not present

## 2017-12-28 DIAGNOSIS — N912 Amenorrhea, unspecified: Secondary | ICD-10-CM | POA: Diagnosis not present

## 2017-12-28 DIAGNOSIS — Z3201 Encounter for pregnancy test, result positive: Secondary | ICD-10-CM | POA: Diagnosis not present

## 2018-01-12 DIAGNOSIS — Z3401 Encounter for supervision of normal first pregnancy, first trimester: Secondary | ICD-10-CM | POA: Diagnosis not present

## 2018-01-12 DIAGNOSIS — Z01419 Encounter for gynecological examination (general) (routine) without abnormal findings: Secondary | ICD-10-CM | POA: Diagnosis not present

## 2018-01-12 DIAGNOSIS — Z3201 Encounter for pregnancy test, result positive: Secondary | ICD-10-CM | POA: Diagnosis not present

## 2018-01-12 DIAGNOSIS — Z3400 Encounter for supervision of normal first pregnancy, unspecified trimester: Secondary | ICD-10-CM | POA: Diagnosis not present

## 2018-01-12 DIAGNOSIS — N912 Amenorrhea, unspecified: Secondary | ICD-10-CM | POA: Diagnosis not present

## 2018-02-04 ENCOUNTER — Encounter (HOSPITAL_COMMUNITY): Payer: Self-pay | Admitting: *Deleted

## 2018-02-04 ENCOUNTER — Other Ambulatory Visit: Payer: Self-pay

## 2018-02-04 ENCOUNTER — Inpatient Hospital Stay (HOSPITAL_COMMUNITY)
Admission: AD | Admit: 2018-02-04 | Discharge: 2018-02-05 | Disposition: A | Discharge status: Home / Self Care | Payer: Commercial Managed Care - PPO | Source: Ambulatory Visit | Attending: Obstetrics and Gynecology | Admitting: Obstetrics and Gynecology

## 2018-02-04 DIAGNOSIS — Z8249 Family history of ischemic heart disease and other diseases of the circulatory system: Secondary | ICD-10-CM | POA: Diagnosis not present

## 2018-02-04 DIAGNOSIS — Z202 Contact with and (suspected) exposure to infections with a predominantly sexual mode of transmission: Secondary | ICD-10-CM | POA: Diagnosis present

## 2018-02-04 DIAGNOSIS — Z91041 Radiographic dye allergy status: Secondary | ICD-10-CM | POA: Diagnosis not present

## 2018-02-04 DIAGNOSIS — Z825 Family history of asthma and other chronic lower respiratory diseases: Secondary | ICD-10-CM | POA: Insufficient documentation

## 2018-02-04 DIAGNOSIS — Z3A12 12 weeks gestation of pregnancy: Secondary | ICD-10-CM | POA: Insufficient documentation

## 2018-02-04 DIAGNOSIS — O98211 Gonorrhea complicating pregnancy, first trimester: Secondary | ICD-10-CM

## 2018-02-04 DIAGNOSIS — O98219 Gonorrhea complicating pregnancy, unspecified trimester: Secondary | ICD-10-CM | POA: Diagnosis present

## 2018-02-04 LAB — POCT PREGNANCY, URINE: Preg Test, Ur: POSITIVE — AB

## 2018-02-04 LAB — URINALYSIS, ROUTINE W REFLEX MICROSCOPIC
BILIRUBIN URINE: NEGATIVE
GLUCOSE, UA: NEGATIVE mg/dL
Hgb urine dipstick: NEGATIVE
KETONES UR: NEGATIVE mg/dL
Leukocytes, UA: NEGATIVE
Nitrite: NEGATIVE
PH: 5 (ref 5.0–8.0)
Protein, ur: NEGATIVE mg/dL
Specific Gravity, Urine: 1.015 (ref 1.005–1.030)

## 2018-02-04 NOTE — MAU Note (Signed)
Pt feels "empty inside", just not feeling how she has been feeling. Pt having some headaches and has one today, denies need for pain meds. Transferring care [place], [place] was told she tested positive for gonorrhea.  Has an appointment on 9/16 to be treated.

## 2018-02-05 ENCOUNTER — Encounter (HOSPITAL_COMMUNITY): Payer: Self-pay | Admitting: *Deleted

## 2018-02-05 DIAGNOSIS — Z91041 Radiographic dye allergy status: Secondary | ICD-10-CM | POA: Diagnosis not present

## 2018-02-05 DIAGNOSIS — Z3A12 12 weeks gestation of pregnancy: Secondary | ICD-10-CM | POA: Diagnosis not present

## 2018-02-05 DIAGNOSIS — O98211 Gonorrhea complicating pregnancy, first trimester: Secondary | ICD-10-CM

## 2018-02-05 DIAGNOSIS — O98219 Gonorrhea complicating pregnancy, unspecified trimester: Secondary | ICD-10-CM | POA: Diagnosis present

## 2018-02-05 MED ORDER — CEFTRIAXONE SODIUM 250 MG IJ SOLR
250.0000 mg | Freq: Once | INTRAMUSCULAR | Status: AC
  Administered 2018-02-05: 250 mg via INTRAMUSCULAR
  Filled 2018-02-05: qty 250

## 2018-02-05 MED ORDER — AZITHROMYCIN 250 MG PO TABS
1000.0000 mg | ORAL_TABLET | Freq: Once | ORAL | Status: AC
  Administered 2018-02-05: 1000 mg via ORAL
  Filled 2018-02-05: qty 4

## 2018-02-05 NOTE — Discharge Instructions (Signed)
Pregnancy and Sexually Transmitted Infections An STI (sexually transmitted infection) is a disease or infection that may be passed (transmitted) from person to person, usually during sexual activity. This may happen by way of saliva, semen, blood, vaginal mucus, or urine. An STI can be caused by bacteria, viruses, or parasites. During pregnancy, STIs can be dangerous for you and your unborn baby. It is important to take steps to reduce your chances of getting an STI. Also, you need to be seen by your health care provider right away if you think you may have an STI, or if you think you may have been exposed to an STI. Diagnosis and treatment will depend on the type of STI. If you are already pregnant, you will be screened for HIV (human immunodeficiency virus) early in your pregnancy. If you are at high risk for HIV, this test may be repeated during your third trimester of pregnancy. What are some common STIs? There are different types of STIs. Some STIs that cause problems in pregnancy include:  Gonorrhea.  Chlamydia.  Syphilis.  HIV and AIDS (acquired immunodeficiency syndrome).  Genital herpes.  Hepatitis.  Genital warts.  Human papillomavirus (HPV).  Trichomoniasis.  STIs that do not affect the baby include:  Chancroid.  Pubic lice.  What are the possible effects of STIs during pregnancy? STIs can cause:  Stillbirth.  Miscarriage.  Premature labor.  Premature rupture of the membranes.  Serious birth defects or deformities.  Infection of the amniotic sac.  Infections that occur after birth (postpartum) in you and the baby.  Slowed growth of the baby before birth.  Illnesses in newborns.  What are common symptoms of STIs? Different STIs have different symptoms. Some women may not have any symptoms. If symptoms are present, they may include:  Painful or bloody urination.  Pain in the pelvis, abdomen, vagina, anus, throat, or eyes.  A skin rash, itching, or  irritation.  Growths, ulcerations, blisters, or sores in the genital and anal areas.  Fever.  Abnormal vaginal discharge, with or without bad odor.  Pain or bleeding during sexual intercourse.  Yellowing of the skin and the white parts of the eyes (jaundice).  Swollen glands in the groin area.  Even if symptoms are not present, an STI can still be passed to another person during sexual contact. How are STIs diagnosed? Your health care provider can use tests to determine if you have an STI. These may include blood tests, urine tests, and tests performed during a pelvic exam. You should be screened for STIs, including gonorrhea and chlamydia, if:  You are sexually active and are younger than age 24.  You are age 24 or older and your health care provider tells you that you are at risk for these types of infections.  Your sexual activity has changed since the last time you were screened, and you are at an increased risk for chlamydia or gonorrhea. Ask your health care provider if you are at risk.  How can I reduce my risk of getting an STI? Take these actions to reduce your risk of getting an STI:  Do not have any oral, vaginal, or anal sex. This is known as practicing abstinence.  If you have sex, use a latex condom or a female condom consistently and correctly during sexual intercourse.  Use dental dams and water-soluble lubricants during sexual activity. Do not use petroleum jelly or oils.  Avoid having multiple sexual partners.  Do not have sex with someone who has other   sexual partners.  Do not have sex with anyone you do not know or who is at high risk for an STI.  Avoid risky sex acts that can break the skin.  Do not have sex if you have open sores on your mouth or skin.  Avoid engaging in oral and anal sex acts.  Get the hepatitis vaccine. It is safe for pregnant women.  What should I do if I think I have an STI?  See your health care provider.  Tell your sexual  partner or partners. They should be tested and treated for any STIs.  Do not have sex until your health care provider says it is okay. Get help right away if: Contact your health care provider right away if:  You have any symptoms of an STI.  You think that you have, or your sexual partner has, an STI even if there are no symptoms.  You think that you may have been exposed to an STI.  This information is not intended to replace advice given to you by your health care provider. Make sure you discuss any questions you have with your health care provider. Document Released: 06/23/2004 Document Revised: 01/14/2016 Document Reviewed: 12/21/2015 Elsevier Interactive Patient Education  2018 Elsevier Inc.  

## 2018-02-05 NOTE — MAU Provider Note (Signed)
History     CSN: [id number]  Arrival date and time: 02/04/18 1751   First Provider Initiated Contact with Patient 02/05/18 0205      Chief Complaint  Patient presents with  . Exposure to STD   HPI  Ms.  Maghan Schwarting is a 22 y.o. year old G80P0000 female at [redacted]w[redacted]d weeks gestation who presents to MAU reporting "feeling empty inside" just not like she has been feeling, some H/As - no need for medication. She transferring her care from [place], [place]; where she was told she is (+) for gonorrhea. She has an appointment to be treated on 02/12/18.  Past Medical History:  Diagnosis Date  . Allergy   . Anxiety   . Depression   . IIH (idiopathic intracranial hypertension)   . IUD (intrauterine device) in place    Paraguard placed 2014, removed 10/2015  . Migraine   . Pelvic inflammatory disease 10/2012    Past Surgical History:  Procedure Laterality Date  . BRAIN SURGERY    . NO PAST SURGERIES      Family History  Problem Relation Age of Onset  . Emphysema Maternal Grandfather   . Depression Maternal Grandfather   . Anxiety disorder Maternal Grandfather   . Mental illness Maternal Grandfather   . Diabetes Maternal Grandfather   . Cancer Paternal Grandmother   . Non-Hodgkin's lymphoma Paternal Grandmother   . Sudden death Paternal Grandmother   . Asthma Mother   . Hypertension Father   . Alcohol abuse Maternal Aunt   . Hyperlipidemia Maternal Aunt     Social History   Tobacco Use  . Smoking status: Never Smoker  . Smokeless tobacco: Never Used  Substance Use Topics  . Alcohol use: Not Currently    Alcohol/week: 2.0 standard drinks    Types: 2 Shots of liquor per week    Comment: social  . Drug use: Not Currently    Types: Other-see comments, Marijuana    Allergies:  Allergies  Allergen Reactions  . Gadolinium Derivatives Itching, Nausea Only and Cough    No medications prior to admission.    Review of Systems - all other systems negative Constitutional:  Negative.   Eyes: Negative.   Gastrointestinal:       "just feeling empty", "after eating feels full in upper belly, but I feel empty in the lower part of my belly "  Physical Exam   Blood pressure 125/71, pulse 79, temperature 98.3 F (36.8 C), temperature source Oral, resp. rate 17, height 5\' 6"  (1.676 m), weight 121.6 kg, last menstrual period 11/12/2017, SpO2 98 %.  Physical Exam  Nursing note and vitals reviewed. Constitutional: She is oriented to person, place, and time. She appears well-developed and well-nourished.  HENT:  Head: Normocephalic and atraumatic.  Eyes: Pupils are equal, round, and reactive to light.  Neck: Normal range of motion.  Cardiovascular: Normal rate.  Respiratory: Effort normal.  GI: Soft.  Genitourinary:  Genitourinary Comments: Very active fetus, S=D, FHT 169 bpm  Musculoskeletal: Normal range of motion.  Neurological: She is alert and oriented to person, place, and time.  Skin: Skin is warm and dry.  Psychiatric: She has a normal mood and affect. Her behavior is normal. Judgment and thought content normal.    MAU Course  Procedures  MDM [facility] UPT Informal BS U/S - active fetus, S=D, FHT=169 bpm Crackers, peanut butter and sprite Rocephin 250 mg IM Azithromycin 1000 mg po  *Consult with Dr. Dareen Piano @ 0222 - notified of patient's  complaints, assessments, lab & bedside U/S results, tx plan F/U with office as scheduled - ok to d/c home, agrees with plan  Results for orders placed or performed during the hospital encounter of 02/04/18 (from the past 24 hour(s))  Urinalysis, Routine w reflex microscopic     Status: None   Collection Time: 02/04/18  8:30 PM  Result Value Ref Range   Color, Urine YELLOW YELLOW   APPearance CLEAR CLEAR   Specific Gravity, Urine 1.015 1.005 - 1.030   pH 5.0 5.0 - 8.0   Glucose, UA NEGATIVE NEGATIVE mg/dL   Hgb urine dipstick NEGATIVE NEGATIVE   Bilirubin Urine NEGATIVE NEGATIVE   Ketones, ur NEGATIVE NEGATIVE  mg/dL   Protein, ur NEGATIVE NEGATIVE mg/dL   Nitrite NEGATIVE NEGATIVE   Leukocytes, UA NEGATIVE NEGATIVE  Pregnancy, urine POC     Status: Abnormal   Collection Time: 02/04/18  9:22 PM  Result Value Ref Range   Preg Test, Ur POSITIVE (A) NEGATIVE    Assessment and Plan  Gonorrhea affecting pregnancy in first trimester - Plan: Discharge patient - Information provided on pregnancy and STIs - Discharge home - Keep scheduled appt with [facility] - Patient verbalized an understanding of the plan of care and agrees.      Raelyn Mora, MSN, CNM 02/05/2018, 2:06 AM

## 2018-02-08 LAB — OB RESULTS CONSOLE HIV ANTIBODY (ROUTINE TESTING): HIV: NONREACTIVE

## 2018-02-08 LAB — OB RESULTS CONSOLE RUBELLA ANTIBODY, IGM: RUBELLA: IMMUNE

## 2018-02-08 LAB — OB RESULTS CONSOLE ABO/RH: RH TYPE: POSITIVE

## 2018-02-08 LAB — OB RESULTS CONSOLE GC/CHLAMYDIA
Chlamydia: NEGATIVE
Gonorrhea: NEGATIVE

## 2018-02-08 LAB — OB RESULTS CONSOLE ANTIBODY SCREEN: Antibody Screen: NEGATIVE

## 2018-02-08 LAB — OB RESULTS CONSOLE HEPATITIS B SURFACE ANTIGEN: HEP B S AG: NEGATIVE

## 2018-02-08 LAB — OB RESULTS CONSOLE RPR: RPR: NONREACTIVE

## 2018-02-12 DIAGNOSIS — Z3491 Encounter for supervision of normal pregnancy, unspecified, first trimester: Secondary | ICD-10-CM | POA: Diagnosis not present

## 2018-02-13 DIAGNOSIS — G932 Benign intracranial hypertension: Secondary | ICD-10-CM | POA: Diagnosis not present

## 2018-02-13 DIAGNOSIS — H4710 Unspecified papilledema: Secondary | ICD-10-CM | POA: Diagnosis not present

## 2018-02-13 DIAGNOSIS — H11433 Conjunctival hyperemia, bilateral: Secondary | ICD-10-CM | POA: Diagnosis not present

## 2018-02-13 DIAGNOSIS — H53433 Sector or arcuate defects, bilateral: Secondary | ICD-10-CM | POA: Diagnosis not present

## 2018-02-22 DIAGNOSIS — R51 Headache: Secondary | ICD-10-CM | POA: Diagnosis not present

## 2018-02-22 DIAGNOSIS — H04123 Dry eye syndrome of bilateral lacrimal glands: Secondary | ICD-10-CM | POA: Diagnosis not present

## 2018-02-22 DIAGNOSIS — H4710 Unspecified papilledema: Secondary | ICD-10-CM | POA: Diagnosis not present

## 2018-03-20 DIAGNOSIS — Z23 Encounter for immunization: Secondary | ICD-10-CM | POA: Diagnosis not present

## 2018-04-04 DIAGNOSIS — R894 Abnormal immunological findings in specimens from other organs, systems and tissues: Secondary | ICD-10-CM | POA: Diagnosis not present

## 2018-04-10 ENCOUNTER — Telehealth: Payer: Self-pay | Admitting: Diagnostic Neuroimaging

## 2018-04-10 NOTE — Telephone Encounter (Addendum)
Spoke to pt she is [redacted]wks pregnant  (2nd trimester).  She has had within the last 1.5 wks R hand intermittent numbness, and L hip numbness when stands from sitting position.  She has call into OB this am as well.  She is off diamox since pregnant.  No c/o vision problems.  Has headaches that come and go, relieved by tylenol.  Bp has been good.  Last seen with us 01/31/2017. Cancelled last appt.  ? If related to IIH.  I relayed did not think so.  Please advise.

## 2018-04-10 NOTE — Telephone Encounter (Signed)
LMVM for pt that was returning call.

## 2018-04-10 NOTE — Telephone Encounter (Signed)
Patient is pregnant and for over a week her right hand has intermittent numbness. Please call and discuss.

## 2018-04-11 NOTE — Telephone Encounter (Signed)
Follow up with Ob/Gyn. These are not related to IIH. -VRP

## 2018-04-11 NOTE — Telephone Encounter (Signed)
LMVM for pt that sx are not related to IIH, (what we see you for).  F/u with your ob/gyn. These reccs per Dr. Marjory LiesPenumalli.  She is to call back if questions.

## 2018-04-30 DIAGNOSIS — R51 Headache: Secondary | ICD-10-CM | POA: Diagnosis not present

## 2018-04-30 DIAGNOSIS — H04123 Dry eye syndrome of bilateral lacrimal glands: Secondary | ICD-10-CM | POA: Diagnosis not present

## 2018-05-03 DIAGNOSIS — Z3402 Encounter for supervision of normal first pregnancy, second trimester: Secondary | ICD-10-CM | POA: Diagnosis not present

## 2018-05-03 DIAGNOSIS — N925 Other specified irregular menstruation: Secondary | ICD-10-CM | POA: Diagnosis not present

## 2018-05-03 DIAGNOSIS — Z113 Encounter for screening for infections with a predominantly sexual mode of transmission: Secondary | ICD-10-CM | POA: Diagnosis not present

## 2018-05-03 DIAGNOSIS — R350 Frequency of micturition: Secondary | ICD-10-CM | POA: Diagnosis not present

## 2018-05-03 DIAGNOSIS — Z3A24 24 weeks gestation of pregnancy: Secondary | ICD-10-CM | POA: Diagnosis not present

## 2018-05-03 DIAGNOSIS — Z3492 Encounter for supervision of normal pregnancy, unspecified, second trimester: Secondary | ICD-10-CM | POA: Diagnosis not present

## 2018-05-16 DIAGNOSIS — Z369 Encounter for antenatal screening, unspecified: Secondary | ICD-10-CM | POA: Diagnosis not present

## 2018-05-16 DIAGNOSIS — G96810 Intracranial hypotension, unspecified: Secondary | ICD-10-CM | POA: Insufficient documentation

## 2018-05-16 DIAGNOSIS — Z3492 Encounter for supervision of normal pregnancy, unspecified, second trimester: Secondary | ICD-10-CM | POA: Diagnosis not present

## 2018-05-16 DIAGNOSIS — Z3A26 26 weeks gestation of pregnancy: Secondary | ICD-10-CM | POA: Diagnosis not present

## 2018-05-16 DIAGNOSIS — Z23 Encounter for immunization: Secondary | ICD-10-CM | POA: Diagnosis not present

## 2018-05-19 DIAGNOSIS — G932 Benign intracranial hypertension: Secondary | ICD-10-CM | POA: Insufficient documentation

## 2018-06-12 ENCOUNTER — Ambulatory Visit (INDEPENDENT_AMBULATORY_CARE_PROVIDER_SITE_OTHER): Payer: Self-pay | Admitting: Pediatrics

## 2018-06-12 DIAGNOSIS — Z7681 Expectant parent(s) prebirth pediatrician visit: Secondary | ICD-10-CM

## 2018-06-12 NOTE — Progress Notes (Signed)
Prenatal counseling for impending newborn done. Prenatal care with OB started around [redacted] weeks GA. Mom has issues with pressures in her eyes and is followed by ophthalmologist. No GHTN, no GDM.

## 2018-06-20 DIAGNOSIS — N762 Acute vulvitis: Secondary | ICD-10-CM | POA: Diagnosis not present

## 2018-06-20 DIAGNOSIS — Z3A21 21 weeks gestation of pregnancy: Secondary | ICD-10-CM | POA: Diagnosis not present

## 2018-06-20 DIAGNOSIS — N898 Other specified noninflammatory disorders of vagina: Secondary | ICD-10-CM | POA: Diagnosis not present

## 2018-06-22 DIAGNOSIS — A6000 Herpesviral infection of urogenital system, unspecified: Secondary | ICD-10-CM | POA: Insufficient documentation

## 2018-06-26 DIAGNOSIS — O99213 Obesity complicating pregnancy, third trimester: Secondary | ICD-10-CM | POA: Diagnosis not present

## 2018-06-26 DIAGNOSIS — Z3A32 32 weeks gestation of pregnancy: Secondary | ICD-10-CM | POA: Diagnosis not present

## 2018-07-03 DIAGNOSIS — O99213 Obesity complicating pregnancy, third trimester: Secondary | ICD-10-CM | POA: Diagnosis not present

## 2018-07-03 DIAGNOSIS — Z3A33 33 weeks gestation of pregnancy: Secondary | ICD-10-CM | POA: Diagnosis not present

## 2018-07-10 DIAGNOSIS — O99213 Obesity complicating pregnancy, third trimester: Secondary | ICD-10-CM | POA: Diagnosis not present

## 2018-07-10 DIAGNOSIS — Z3A34 34 weeks gestation of pregnancy: Secondary | ICD-10-CM | POA: Diagnosis not present

## 2018-07-17 DIAGNOSIS — O99213 Obesity complicating pregnancy, third trimester: Secondary | ICD-10-CM | POA: Diagnosis not present

## 2018-07-17 DIAGNOSIS — Z3A35 35 weeks gestation of pregnancy: Secondary | ICD-10-CM | POA: Diagnosis not present

## 2018-07-24 DIAGNOSIS — Z369 Encounter for antenatal screening, unspecified: Secondary | ICD-10-CM | POA: Diagnosis not present

## 2018-07-24 DIAGNOSIS — Z3A36 36 weeks gestation of pregnancy: Secondary | ICD-10-CM | POA: Diagnosis not present

## 2018-07-24 DIAGNOSIS — O99213 Obesity complicating pregnancy, third trimester: Secondary | ICD-10-CM | POA: Diagnosis not present

## 2018-07-27 LAB — OB RESULTS CONSOLE GBS: GBS: NEGATIVE

## 2018-07-31 DIAGNOSIS — Z3493 Encounter for supervision of normal pregnancy, unspecified, third trimester: Secondary | ICD-10-CM | POA: Diagnosis not present

## 2018-07-31 DIAGNOSIS — Z3A37 37 weeks gestation of pregnancy: Secondary | ICD-10-CM | POA: Diagnosis not present

## 2018-07-31 DIAGNOSIS — O99213 Obesity complicating pregnancy, third trimester: Secondary | ICD-10-CM | POA: Diagnosis not present

## 2018-08-07 DIAGNOSIS — O99213 Obesity complicating pregnancy, third trimester: Secondary | ICD-10-CM | POA: Diagnosis not present

## 2018-08-07 DIAGNOSIS — Z3A38 38 weeks gestation of pregnancy: Secondary | ICD-10-CM | POA: Diagnosis not present

## 2018-08-13 ENCOUNTER — Telehealth (HOSPITAL_COMMUNITY): Payer: Self-pay | Admitting: *Deleted

## 2018-08-13 ENCOUNTER — Encounter (HOSPITAL_COMMUNITY): Payer: Self-pay | Admitting: *Deleted

## 2018-08-13 NOTE — Telephone Encounter (Signed)
Preadmission screen  

## 2018-08-14 ENCOUNTER — Inpatient Hospital Stay (HOSPITAL_COMMUNITY)
Admission: AD | Admit: 2018-08-14 | Discharge: 2018-08-14 | Disposition: A | Discharge status: Home / Self Care | Payer: Medicaid Other | Attending: Obstetrics and Gynecology | Admitting: Obstetrics and Gynecology

## 2018-08-14 ENCOUNTER — Other Ambulatory Visit: Payer: Self-pay | Admitting: Obstetrics and Gynecology

## 2018-08-14 ENCOUNTER — Encounter (HOSPITAL_COMMUNITY): Payer: Self-pay | Admitting: *Deleted

## 2018-08-14 ENCOUNTER — Other Ambulatory Visit: Payer: Self-pay

## 2018-08-14 DIAGNOSIS — A609 Anogenital herpesviral infection, unspecified: Secondary | ICD-10-CM | POA: Insufficient documentation

## 2018-08-14 DIAGNOSIS — O368130 Decreased fetal movements, third trimester, not applicable or unspecified: Secondary | ICD-10-CM | POA: Diagnosis not present

## 2018-08-14 DIAGNOSIS — Z91041 Radiographic dye allergy status: Secondary | ICD-10-CM | POA: Insufficient documentation

## 2018-08-14 DIAGNOSIS — Z833 Family history of diabetes mellitus: Secondary | ICD-10-CM | POA: Diagnosis not present

## 2018-08-14 DIAGNOSIS — Z809 Family history of malignant neoplasm, unspecified: Secondary | ICD-10-CM | POA: Insufficient documentation

## 2018-08-14 DIAGNOSIS — G932 Benign intracranial hypertension: Secondary | ICD-10-CM | POA: Diagnosis not present

## 2018-08-14 DIAGNOSIS — Z818 Family history of other mental and behavioral disorders: Secondary | ICD-10-CM | POA: Insufficient documentation

## 2018-08-14 DIAGNOSIS — Z87891 Personal history of nicotine dependence: Secondary | ICD-10-CM | POA: Insufficient documentation

## 2018-08-14 DIAGNOSIS — O98513 Other viral diseases complicating pregnancy, third trimester: Secondary | ICD-10-CM | POA: Insufficient documentation

## 2018-08-14 DIAGNOSIS — Z3A39 39 weeks gestation of pregnancy: Secondary | ICD-10-CM | POA: Insufficient documentation

## 2018-08-14 DIAGNOSIS — F329 Major depressive disorder, single episode, unspecified: Secondary | ICD-10-CM | POA: Diagnosis not present

## 2018-08-14 DIAGNOSIS — Z79899 Other long term (current) drug therapy: Secondary | ICD-10-CM | POA: Insufficient documentation

## 2018-08-14 DIAGNOSIS — O99213 Obesity complicating pregnancy, third trimester: Secondary | ICD-10-CM | POA: Diagnosis not present

## 2018-08-14 DIAGNOSIS — O99343 Other mental disorders complicating pregnancy, third trimester: Secondary | ICD-10-CM | POA: Insufficient documentation

## 2018-08-14 DIAGNOSIS — F419 Anxiety disorder, unspecified: Secondary | ICD-10-CM | POA: Insufficient documentation

## 2018-08-14 DIAGNOSIS — O368131 Decreased fetal movements, third trimester, fetus 1: Secondary | ICD-10-CM

## 2018-08-14 DIAGNOSIS — Z3493 Encounter for supervision of normal pregnancy, unspecified, third trimester: Secondary | ICD-10-CM | POA: Diagnosis not present

## 2018-08-14 LAB — CBC
HCT: 35 % — ABNORMAL LOW (ref 36.0–46.0)
Hemoglobin: 11.2 g/dL — ABNORMAL LOW (ref 12.0–15.0)
MCH: 26.4 pg (ref 26.0–34.0)
MCHC: 32 g/dL (ref 30.0–36.0)
MCV: 82.5 fL (ref 80.0–100.0)
Platelets: 319 10*3/uL (ref 150–400)
RBC: 4.24 MIL/uL (ref 3.87–5.11)
RDW: 13.9 % (ref 11.5–15.5)
WBC: 14.7 10*3/uL — ABNORMAL HIGH (ref 4.0–10.5)
nRBC: 0 % (ref 0.0–0.2)

## 2018-08-14 LAB — COMPREHENSIVE METABOLIC PANEL
ALK PHOS: 185 U/L — AB (ref 38–126)
ALT: 9 U/L (ref 0–44)
AST: 17 U/L (ref 15–41)
Albumin: 2.7 g/dL — ABNORMAL LOW (ref 3.5–5.0)
Anion gap: 8 (ref 5–15)
BUN: 8 mg/dL (ref 6–20)
CO2: 21 mmol/L — ABNORMAL LOW (ref 22–32)
CREATININE: 0.82 mg/dL (ref 0.44–1.00)
Calcium: 9.7 mg/dL (ref 8.9–10.3)
Chloride: 108 mmol/L (ref 98–111)
GFR calc Af Amer: >60 mL/min (ref >60)
GFR calc non Af Amer: >60 mL/min (ref >60)
Glucose, Bld: 99 mg/dL (ref 70–99)
Potassium: 3.9 mmol/L (ref 3.5–5.1)
Sodium: 137 mmol/L (ref 135–145)
Total Bilirubin: 0.4 mg/dL (ref 0.3–1.2)
Total Protein: 6.7 g/dL (ref 6.5–8.1)

## 2018-08-14 LAB — PROTEIN / CREATININE RATIO, URINE
Creatinine, Urine: 192.46 mg/dL
Protein Creatinine Ratio: 0.11 mg/mg{Cre} (ref 0.00–0.15)
Total Protein, Urine: 21 mg/dL

## 2018-08-14 NOTE — MAU Note (Signed)
Pt states she hasn't felt fetal movement since night before last, has felt pressure but no movement.  Having mild contractions, denies bleeding or LOF.

## 2018-08-14 NOTE — MAU Provider Note (Signed)
Chief Complaint:  Decreased Fetal Movement   First Provider Initiated Contact with Patient 08/14/18 1209     HPI: Stacy Mills is a 23 y.o. G1P0000 at [redacted]w[redacted]d who presents to maternity admissions from office for extended monitoring due to decrease fetal movement.  Pt complained of decreased fetal movement with BPP 8/8 in office.  Denies pain, bleeding or leakage of fluid.  Location: decreased fetal movement Quality: FM increased since here Duration: last night  Good fetal movement.   Pregnancy Course:   Past Medical History:  Diagnosis Date  . Allergy   . Anxiety   . Depression   . HSV (herpes simplex virus) anogenital infection   . IIH (idiopathic intracranial hypertension)   . IUD (intrauterine device) in place    Paraguard placed 2014, removed 10/2015  . Migraine   . Pelvic inflammatory disease 10/2012   OB History  Gravida Para Term Preterm AB Living  1       0 0  SAB TAB Ectopic Multiple Live Births      0        # Outcome Date GA Lbr Len/2nd Weight Sex Delivery Anes PTL Lv  1 Current            Past Surgical History:  Procedure Laterality Date  . spinal tap     Family History  Problem Relation Age of Onset  . Emphysema Maternal Grandfather   . Depression Maternal Grandfather   . Anxiety disorder Maternal Grandfather   . Mental illness Maternal Grandfather   . Diabetes Maternal Grandfather   . Cancer Paternal Grandmother   . Non-Hodgkin's lymphoma Paternal Grandmother   . Sudden death Paternal Grandmother   . Asthma Mother   . Hypertension Father   . Alcohol abuse Maternal Aunt   . Hyperlipidemia Maternal Aunt    Social History   Tobacco Use  . Smoking status: Former Games developer  . Smokeless tobacco: Never Used  Substance Use Topics  . Alcohol use: Not Currently    Alcohol/week: 2.0 standard drinks    Types: 2 Shots of liquor per week    Comment: social  . Drug use: Not Currently    Types: Other-see comments, Marijuana   Allergies  Allergen Reactions   . Gadolinium Derivatives Itching, Nausea Only and Cough  . Ivp Dye [Iodinated Diagnostic Agents]    Medications Prior to Admission  Medication Sig Dispense Refill Last Dose  . acetaminophen (TYLENOL) 325 MG tablet Take 650 mg by mouth every 6 (six) hours as needed.   02/04/2018 at Unknown time  . acetaZOLAMIDE (DIAMOX) 500 MG capsule Take 1 capsule (500 mg total) by mouth daily. 90 capsule 4 More than a month at Unknown time  . betamethasone dipropionate (DIPROLENE) 0.05 % cream Apply topically 2 (two) times daily. 30 g 0 Taking  . cetirizine (ZYRTEC) 10 MG tablet Take 1 tablet (10 mg total) by mouth daily. 30 tablet 11 More than a month at Unknown time  . Crisaborole (EUCRISA) 2 % OINT Apply topically.   Past Week at Unknown time  . ondansetron (ZOFRAN ODT) 8 MG disintegrating tablet Take 1 tablet (8 mg total) by mouth every 8 (eight) hours as needed for nausea or vomiting. 10 tablet 0 More than a month at Unknown time  . triamcinolone (NASACORT) 55 MCG/ACT AERO nasal inhaler Place 1 spray into the nose 2 (two) times daily. 16.9 mL 12 More than a month at Unknown time    I have reviewed patient's Past Medical Hx,  Surgical Hx, Family Hx, Social Hx, medications and allergies.   ROS:  Review of Systems  All other systems reviewed and are negative.   Physical Exam   Patient Vitals for the past 24 hrs:  BP Temp Temp src Pulse Resp Height Weight  08/14/18 1431 106/64 - - 90 - - -  08/14/18 1416 107/62 - - 90 - - -  08/14/18 1401 103/65 - - 94 - - -  08/14/18 1346 129/89 - - (!) 106 - - -  08/14/18 1331 116/65 - - 91 - - -  08/14/18 1316 125/84 - - 96 - - -  08/14/18 1301 125/86 - - (!) 107 - - -  08/14/18 1246 128/87 - - (!) 104 - - -  08/14/18 1225 (!) 135/93 - - 88 - - -  08/14/18 1159 127/88 - - 94 - - -  08/14/18 1146 (!) 132/92 97.7 F (36.5 C) Oral (!) 107 18 - -  08/14/18 1142 - - - - -  (1.676 m) 129.7 kg   Constitutional: Well-developed, well-nourished female in no  acute distress.  Cardiovascular: normal rate Respiratory: normal effort GI: Abd soft, non-tender, gravid appropriate for gestational age MS: Extremities nontender, no edema, normal ROM Neurologic: Alert and oriented x 4.  FHT:  Baseline 140 , moderate variability, accelerations present, no decelerations Contractions: None   Labs: Results for orders placed or performed during the hospital encounter of 08/14/18 (from the past 24 hour(s))  CBC     Status: Abnormal   Collection Time: 08/14/18  1:43 PM  Result Value Ref Range   WBC 14.7 (H) 4.0 - 10.5 K/uL   RBC 4.24 3.87 - 5.11 MIL/uL   Hemoglobin 11.2 (L) 12.0 - 15.0 g/dL   HCT 02.5 (L) 42.7 - 06.2 %   MCV 82.5 80.0 - 100.0 fL   MCH 26.4 26.0 - 34.0 pg   MCHC 32.0 30.0 - 36.0 g/dL   RDW 37.6 28.3 - 15.1 %   Platelets 319 150 - 400 K/uL   nRBC 0.0 0.0 - 0.2 %  Comprehensive metabolic panel     Status: Abnormal   Collection Time: 08/14/18  1:43 PM  Result Value Ref Range   Sodium 137 135 - 145 mmol/L   Potassium 3.9 3.5 - 5.1 mmol/L   Chloride 108 98 - 111 mmol/L   CO2 21 (L) 22 - 32 mmol/L   Glucose, Bld 99 70 - 99 mg/dL   BUN 8 6 - 20 mg/dL   Creatinine, Ser 7.61 0.44 - 1.00 mg/dL   Calcium 9.7 8.9 - 60.7 mg/dL   Total Protein 6.7 6.5 - 8.1 g/dL   Albumin 2.7 (L) 3.5 - 5.0 g/dL   AST 17 15 - 41 U/L   ALT 9 0 - 44 U/L   Alkaline Phosphatase 185 (H) 38 - 126 U/L   Total Bilirubin 0.4 0.3 - 1.2 mg/dL   GFR calc non Af Amer >60 >60 mL/min   GFR calc Af Amer >60 >60 mL/min   Anion gap 8 5 - 15  Protein / creatinine ratio, urine     Status: None   Collection Time: 08/14/18  1:55 PM  Result Value Ref Range   Creatinine, Urine 192.46 mg/dL   Total Protein, Urine 21 mg/dL   Protein Creatinine Ratio 0.11 0.00 - 0.15 mg/mg[Cre]    Imaging:  No results found.  MAU Course: Orders Placed This Encounter  Procedures  . CBC  . Comprehensive metabolic panel  .  Protein / creatinine ratio, urine   No orders of the defined types  were placed in this encounter.  Results for orders placed or performed during the hospital encounter of 08/14/18 (from the past 24 hour(s))  CBC     Status: Abnormal   Collection Time: 08/14/18  1:43 PM  Result Value Ref Range   WBC 14.7 (H) 4.0 - 10.5 K/uL   RBC 4.24 3.87 - 5.11 MIL/uL   Hemoglobin 11.2 (L) 12.0 - 15.0 g/dL   HCT 77.9 (L) 39.0 - 30.0 %   MCV 82.5 80.0 - 100.0 fL   MCH 26.4 26.0 - 34.0 pg   MCHC 32.0 30.0 - 36.0 g/dL   RDW 92.3 30.0 - 76.2 %   Platelets 319 150 - 400 K/uL   nRBC 0.0 0.0 - 0.2 %  Comprehensive metabolic panel     Status: Abnormal   Collection Time: 08/14/18  1:43 PM  Result Value Ref Range   Sodium 137 135 - 145 mmol/L   Potassium 3.9 3.5 - 5.1 mmol/L   Chloride 108 98 - 111 mmol/L   CO2 21 (L) 22 - 32 mmol/L   Glucose, Bld 99 70 - 99 mg/dL   BUN 8 6 - 20 mg/dL   Creatinine, Ser 2.63 0.44 - 1.00 mg/dL   Calcium 9.7 8.9 - 33.5 mg/dL   Total Protein 6.7 6.5 - 8.1 g/dL   Albumin 2.7 (L) 3.5 - 5.0 g/dL   AST 17 15 - 41 U/L   ALT 9 0 - 44 U/L   Alkaline Phosphatase 185 (H) 38 - 126 U/L   Total Bilirubin 0.4 0.3 - 1.2 mg/dL   GFR calc non Af Amer >60 >60 mL/min   GFR calc Af Amer >60 >60 mL/min   Anion gap 8 5 - 15  Protein / creatinine ratio, urine     Status: None   Collection Time: 08/14/18  1:55 PM  Result Value Ref Range   Creatinine, Urine 192.46 mg/dL   Total Protein, Urine 21 mg/dL   Protein Creatinine Ratio 0.11 0.00 - 0.15 mg/mg[Cre]     MDM: NST reactive, labs reviewed and negative. Assessment: Decreased fetal movement Reactive NST Plan: Follow up in office for NST and BPP on Thursday Discharge home in stable condition.   Labor precautions and fetal kick counts Follow-up Information    [facility] Follow up in 3 day(s).   Specialty:  Obstetrics and Gynecology Contact information: [address] [place] [address] [phone]          Continue  PNV  Kenney Houseman, [place] 08/14/2018 4:08 PM

## 2018-08-16 DIAGNOSIS — Z3403 Encounter for supervision of normal first pregnancy, third trimester: Secondary | ICD-10-CM | POA: Diagnosis not present

## 2018-08-16 DIAGNOSIS — Z3A39 39 weeks gestation of pregnancy: Secondary | ICD-10-CM | POA: Diagnosis not present

## 2018-08-16 DIAGNOSIS — G932 Benign intracranial hypertension: Secondary | ICD-10-CM | POA: Diagnosis not present

## 2018-08-16 DIAGNOSIS — A609 Anogenital herpesviral infection, unspecified: Secondary | ICD-10-CM | POA: Diagnosis not present

## 2018-08-16 DIAGNOSIS — O99213 Obesity complicating pregnancy, third trimester: Secondary | ICD-10-CM | POA: Diagnosis not present

## 2018-08-17 ENCOUNTER — Other Ambulatory Visit (HOSPITAL_COMMUNITY): Payer: Self-pay | Admitting: *Deleted

## 2018-08-19 ENCOUNTER — Inpatient Hospital Stay (HOSPITAL_COMMUNITY)
Admission: AD | Admit: 2018-08-19 | Discharge: 2018-08-22 | DRG: 787 | Disposition: A | Discharge status: Home / Self Care | Payer: Medicaid Other | Attending: Obstetrics and Gynecology | Admitting: Obstetrics and Gynecology

## 2018-08-19 ENCOUNTER — Encounter (HOSPITAL_COMMUNITY)
Admission: AD | Disposition: A | Discharge status: Home / Self Care | Payer: Self-pay | Source: Home / Self Care | Attending: Obstetrics and Gynecology

## 2018-08-19 ENCOUNTER — Other Ambulatory Visit: Payer: Self-pay

## 2018-08-19 ENCOUNTER — Inpatient Hospital Stay (HOSPITAL_COMMUNITY): Payer: Medicaid Other | Admitting: Anesthesiology

## 2018-08-19 ENCOUNTER — Inpatient Hospital Stay (HOSPITAL_COMMUNITY): Payer: Medicaid Other

## 2018-08-19 ENCOUNTER — Encounter (HOSPITAL_COMMUNITY): Payer: Self-pay

## 2018-08-19 DIAGNOSIS — Z87891 Personal history of nicotine dependence: Secondary | ICD-10-CM

## 2018-08-19 DIAGNOSIS — A6000 Herpesviral infection of urogenital system, unspecified: Secondary | ICD-10-CM | POA: Diagnosis present

## 2018-08-19 DIAGNOSIS — O9832 Other infections with a predominantly sexual mode of transmission complicating childbirth: Secondary | ICD-10-CM | POA: Diagnosis not present

## 2018-08-19 DIAGNOSIS — O480 Post-term pregnancy: Secondary | ICD-10-CM | POA: Diagnosis not present

## 2018-08-19 DIAGNOSIS — Z3A00 Weeks of gestation of pregnancy not specified: Secondary | ICD-10-CM | POA: Diagnosis not present

## 2018-08-19 DIAGNOSIS — O99214 Obesity complicating childbirth: Secondary | ICD-10-CM | POA: Diagnosis not present

## 2018-08-19 DIAGNOSIS — Z3A40 40 weeks gestation of pregnancy: Secondary | ICD-10-CM | POA: Diagnosis not present

## 2018-08-19 DIAGNOSIS — O99213 Obesity complicating pregnancy, third trimester: Secondary | ICD-10-CM | POA: Diagnosis present

## 2018-08-19 DIAGNOSIS — O98211 Gonorrhea complicating pregnancy, first trimester: Secondary | ICD-10-CM

## 2018-08-19 LAB — CBC
HCT: 34.5 % — ABNORMAL LOW (ref 36.0–46.0)
Hemoglobin: 10.7 g/dL — ABNORMAL LOW (ref 12.0–15.0)
MCH: 25.7 pg — ABNORMAL LOW (ref 26.0–34.0)
MCHC: 31 g/dL (ref 30.0–36.0)
MCV: 82.9 fL (ref 80.0–100.0)
Platelets: 303 10*3/uL (ref 150–400)
RBC: 4.16 MIL/uL (ref 3.87–5.11)
RDW: 14.1 % (ref 11.5–15.5)
WBC: 16 10*3/uL — ABNORMAL HIGH (ref 4.0–10.5)
nRBC: 0 % (ref 0.0–0.2)

## 2018-08-19 LAB — TYPE AND SCREEN
ABO/RH(D): A POS
Antibody Screen: NEGATIVE

## 2018-08-19 LAB — ABO/RH: ABO/RH(D): A POS

## 2018-08-19 LAB — RPR: RPR: NONREACTIVE

## 2018-08-19 SURGERY — Surgical Case
Anesthesia: Epidural

## 2018-08-19 MED ORDER — TERBUTALINE SULFATE 1 MG/ML IJ SOLN: 0.2500 mg | Freq: Once | INTRAMUSCULAR | Status: DC | PRN

## 2018-08-19 MED ORDER — DIPHENHYDRAMINE HCL 50 MG/ML IJ SOLN: 12.5000 mg | INTRAMUSCULAR | Status: DC | PRN

## 2018-08-19 MED ORDER — LIDOCAINE HCL (PF) 1 % IJ SOLN
INTRAMUSCULAR | Status: DC | PRN
  Administered 2018-08-19: 6 mL via EPIDURAL

## 2018-08-19 MED ORDER — OXYTOCIN BOLUS FROM INFUSION: 500.0000 mL | Freq: Once | INTRAVENOUS | Status: DC

## 2018-08-19 MED ORDER — EPHEDRINE 5 MG/ML INJ: 10.0000 mg | INTRAVENOUS | Status: DC | PRN

## 2018-08-19 MED ORDER — OXYTOCIN 40 UNITS IN NORMAL SALINE INFUSION - SIMPLE MED
1.0000 m[IU]/min | INTRAVENOUS | Status: DC
  Filled 2018-08-19: qty 1000

## 2018-08-19 MED ORDER — OXYTOCIN 40 UNITS IN NORMAL SALINE INFUSION - SIMPLE MED: 2.5000 [IU]/h | INTRAVENOUS | Status: DC

## 2018-08-19 MED ORDER — SOD CITRATE-CITRIC ACID 500-334 MG/5ML PO SOLN
30.0000 mL | ORAL | Status: DC | PRN
  Administered 2018-08-19: 30 mL via ORAL
  Filled 2018-08-19: qty 15

## 2018-08-19 MED ORDER — FENTANYL CITRATE (PF) 100 MCG/2ML IJ SOLN: 50.0000 ug | INTRAMUSCULAR | Status: DC | PRN

## 2018-08-19 MED ORDER — LIDOCAINE HCL (PF) 1 % IJ SOLN: 30.0000 mL | INTRAMUSCULAR | Status: DC | PRN

## 2018-08-19 MED ORDER — LACTATED RINGERS AMNIOINFUSION
INTRAVENOUS | Status: DC
  Administered 2018-08-19: 17:00:00 via INTRAUTERINE

## 2018-08-19 MED ORDER — LACTATED RINGERS IV SOLN
500.0000 mL | INTRAVENOUS | Status: DC | PRN
  Administered 2018-08-19 (×2): 500 mL via INTRAVENOUS

## 2018-08-19 MED ORDER — PHENYLEPHRINE 40 MCG/ML (10ML) SYRINGE FOR IV PUSH (FOR BLOOD PRESSURE SUPPORT)
80.0000 ug | PREFILLED_SYRINGE | INTRAVENOUS | Status: DC | PRN
  Administered 2018-08-19: 80 ug via INTRAVENOUS

## 2018-08-19 MED ORDER — ACETAMINOPHEN 325 MG PO TABS: 650.0000 mg | ORAL_TABLET | ORAL | Status: DC | PRN

## 2018-08-19 MED ORDER — LACTATED RINGERS IV SOLN
500.0000 mL | Freq: Once | INTRAVENOUS | Status: AC
  Administered 2018-08-19: 11:00:00 via INTRAVENOUS

## 2018-08-19 MED ORDER — FLEET ENEMA 7-19 GM/118ML RE ENEM: 1.0000 | ENEMA | RECTAL | Status: DC | PRN

## 2018-08-19 MED ORDER — PHENYLEPHRINE 40 MCG/ML (10ML) SYRINGE FOR IV PUSH (FOR BLOOD PRESSURE SUPPORT)
80.0000 ug | PREFILLED_SYRINGE | INTRAVENOUS | Status: DC | PRN
  Filled 2018-08-19 (×2): qty 10

## 2018-08-19 MED ORDER — SODIUM CHLORIDE (PF) 0.9 % IJ SOLN
INTRAMUSCULAR | Status: DC | PRN
  Administered 2018-08-19: 14 mL/h via EPIDURAL

## 2018-08-19 MED ORDER — MISOPROSTOL 25 MCG QUARTER TABLET: 25.0000 ug | ORAL_TABLET | ORAL | Status: DC | PRN

## 2018-08-19 MED ORDER — OXYTOCIN 40 UNITS IN NORMAL SALINE INFUSION - SIMPLE MED
1.0000 m[IU]/min | INTRAVENOUS | Status: DC
  Administered 2018-08-19: 2 m[IU]/min via INTRAVENOUS

## 2018-08-19 MED ORDER — FENTANYL-BUPIVACAINE-NACL 0.5-0.125-0.9 MG/250ML-% EP SOLN: 12.0000 mL/h | EPIDURAL | Status: DC | PRN

## 2018-08-19 MED ORDER — LACTATED RINGERS IV SOLN
INTRAVENOUS | Status: DC
  Administered 2018-08-19 – 2018-08-20 (×5): via INTRAVENOUS

## 2018-08-19 MED ORDER — FENTANYL-BUPIVACAINE-NACL 0.5-0.125-0.9 MG/250ML-% EP SOLN
12.0000 mL/h | EPIDURAL | Status: DC | PRN
  Filled 2018-08-19: qty 250

## 2018-08-19 MED ORDER — SODIUM CHLORIDE 0.9 % IR SOLN
Status: DC | PRN
  Administered 2018-08-19: 1000 mL

## 2018-08-19 MED ORDER — ONDANSETRON HCL 4 MG/2ML IJ SOLN: 4.0000 mg | Freq: Four times a day (QID) | INTRAMUSCULAR | Status: DC | PRN

## 2018-08-19 SURGICAL SUPPLY — 39 items
BENZOIN TINCTURE PRP APPL 2/3 (GAUZE/BANDAGES/DRESSINGS) ×2 IMPLANT
CHLORAPREP W/TINT 26ML (MISCELLANEOUS) ×2 IMPLANT
CLAMP CORD UMBIL (MISCELLANEOUS) IMPLANT
CLOTH BEACON ORANGE TIMEOUT ST (SAFETY) ×2 IMPLANT
CLSR STERI-STRIP ANTIMIC 1/2X4 (GAUZE/BANDAGES/DRESSINGS) ×2 IMPLANT
DRAIN JACKSON PRT FLT 10 (DRAIN) ×2 IMPLANT
DRSG OPSITE POSTOP 4X10 (GAUZE/BANDAGES/DRESSINGS) ×2 IMPLANT
ELECT REM PT RETURN 9FT ADLT (ELECTROSURGICAL) ×2
ELECTRODE REM PT RTRN 9FT ADLT (ELECTROSURGICAL) ×1 IMPLANT
EVACUATOR SILICONE 100CC (DRAIN) IMPLANT
EXTRACTOR VACUUM M CUP 4 TUBE (SUCTIONS) IMPLANT
GLOVE BIO SURGEON STRL SZ 6.5 (GLOVE) ×2 IMPLANT
GLOVE BIOGEL PI IND STRL 7.0 (GLOVE) ×2 IMPLANT
GLOVE BIOGEL PI INDICATOR 7.0 (GLOVE) ×2
GOWN STRL REUS W/TWL LRG LVL3 (GOWN DISPOSABLE) ×4 IMPLANT
HOVERMATT SINGLE USE (MISCELLANEOUS) ×2 IMPLANT
KIT ABG SYR 3ML LUER SLIP (SYRINGE) IMPLANT
NEEDLE HYPO 25X5/8 SAFETYGLIDE (NEEDLE) IMPLANT
NS IRRIG 1000ML POUR BTL (IV SOLUTION) ×2 IMPLANT
PACK C SECTION WH (CUSTOM PROCEDURE TRAY) ×2 IMPLANT
PAD OB MATERNITY 4.3X12.25 (PERSONAL CARE ITEMS) ×2 IMPLANT
PENCIL SMOKE EVAC W/HOLSTER (ELECTROSURGICAL) ×2 IMPLANT
RETRACTOR TRAXI PANNICULUS (MISCELLANEOUS) ×1 IMPLANT
RTRCTR C-SECT PINK 25CM LRG (MISCELLANEOUS) IMPLANT
STRIP CLOSURE SKIN 1/2X4 (GAUZE/BANDAGES/DRESSINGS) ×2 IMPLANT
SUT CHROMIC 0 CT 1 (SUTURE) ×2 IMPLANT
SUT MNCRL AB 3-0 PS2 27 (SUTURE) ×2 IMPLANT
SUT PLAIN 2 0 (SUTURE) ×2
SUT PLAIN 2 0 XLH (SUTURE) ×4 IMPLANT
SUT PLAIN ABS 2-0 CT1 27XMFL (SUTURE) ×2 IMPLANT
SUT SILK 2 0 SH (SUTURE) IMPLANT
SUT VIC AB 0 CTX 36 (SUTURE) ×6
SUT VIC AB 0 CTX36XBRD ANBCTRL (SUTURE) ×6 IMPLANT
SUT VIC AB 2-0 SH 27 (SUTURE)
SUT VIC AB 2-0 SH 27XBRD (SUTURE) IMPLANT
TOWEL OR 17X24 6PK STRL BLUE (TOWEL DISPOSABLE) ×2 IMPLANT
TRAXI PANNICULUS RETRACTOR (MISCELLANEOUS) ×1
TRAY FOLEY W/BAG SLVR 14FR LF (SET/KITS/TRAYS/PACK) ×2 IMPLANT
WATER STERILE IRR 1000ML POUR (IV SOLUTION) ×2 IMPLANT

## 2018-08-19 NOTE — Progress Notes (Signed)
Subjective: Pt comfortable with epidural.  Hard to pick up contractions with external monitor  Objective: BP 126/73   Pulse (!) 110   Temp 98.3 F (36.8 C) (Oral)   Resp 18   Ht 5\' 6"  (1.676 m)   Wt 129.6 kg   LMP 11/12/2017   SpO2 100%   BMI 46.12 kg/m  No intake/output data recorded. No intake/output data recorded.  FHT: Category 1 FHT 130 accels variability present, no decels UC:   regular, every 4 minutes SVE:   Dilation: 5 Effacement (%): 70 Station: -2 Exam by:: nprothero,cnm Pitocin at 10 mu AROM clear fluid IUPC placed without difficulty.  Procedure explained to patient before placing.  Assessment:  23 y.o., G1P0at 40+0weeks IOL for increased BMI of 43.9 Category 1 tracing  Plan: Monitor progress Anticipate SVD  Kenney Houseman CNM, MSN 08/19/2018, 11:46 AM

## 2018-08-19 NOTE — Progress Notes (Signed)
Labor Progress Note Stacy Mills a 23 y.o.female, G1P0at 40+0weeks,herefor IOL for elevated BMI 43.9.  Rechecked SVE at 2100. No cervical change x 9+ hours in active labor (6cm).  MVUs adequate at 200. Dr. Normand Sloop consulted. Ms. Ladas offered Cesarean section for arrest of dilation.  Subjective:  [name] was quiet and did not ask any questions at this time. Her mother was at the bedside and nodding, holding her hand. Stacy Mills is feeling the contractions now, but we discontinued her pitocin, pending the Cesarean section.  Objective:  BP 113/72   Pulse 88   Temp 97.8 F (36.6 C) (Oral)   Resp 18   Ht 5\' 6"  (1.676 m)   Wt 129.6 kg   LMP 11/12/2017   SpO2 100%   BMI 46.12 kg/m    FHT: Category 2, variable decels, moderate variability, accels absent. UC:   4 in 10 minutes, regular SVE:   Dilation: 6 Effacement (%): 90 Station: -1 Exam by:: Arieal Cuoco, CNM Pitocin discontinued MVUs 200  Assessment/Plan:  Fetal Wellbeing: category 2 Labor: no cervical change since 1140, ROM x 8h Preeclampsia:  n/a GBS:   neg Pain Control:  epidural Anticipated MOD:  Cesarean section, arrest of dilation   Jonetta Speak, CNM 08/19/2018, 9:33 PM

## 2018-08-19 NOTE — Anesthesia Preprocedure Evaluation (Signed)
Anesthesia Evaluation  Patient identified by MRN, date of birth, ID band Patient awake    Reviewed: Allergy & Precautions, H&P , NPO status , Patient's Chart, lab work & pertinent test results, reviewed documented beta blocker date and time   Airway Mallampati: II  TM Distance: >3 FB Neck ROM: full    Dental no notable dental hx.    Pulmonary neg pulmonary ROS, former smoker,    Pulmonary exam normal breath sounds clear to auscultation       Cardiovascular negative cardio ROS Normal cardiovascular exam Rhythm:regular Rate:Normal     Neuro/Psych  Headaches, PSYCHIATRIC DISORDERS Anxiety Depression H/o resolved intracranial htn    GI/Hepatic negative GI ROS, Neg liver ROS,   Endo/Other  Morbid obesity  Renal/GU negative Renal ROS  negative genitourinary   Musculoskeletal   Abdominal (+) + obese,   Peds  Hematology negative hematology ROS (+)   Anesthesia Other Findings   Reproductive/Obstetrics (+) Pregnancy                             Anesthesia Physical Anesthesia Plan  ASA: III  Anesthesia Plan: Epidural   Post-op Pain Management:    Induction:   PONV Risk Score and Plan:   Airway Management Planned:   Additional Equipment:   Intra-op Plan:   Post-operative Plan:   Informed Consent: I have reviewed the patients History and Physical, chart, labs and discussed the procedure including the risks, benefits and alternatives for the proposed anesthesia with the patient or authorized representative who has indicated his/her understanding and acceptance.     Dental Advisory Given  Plan Discussed with: Anesthesiologist and Surgeon  Anesthesia Plan Comments: (Labs checked- platelets confirmed with RN in room. Fetal heart tracing, per RN, reported to be stable enough for sitting procedure. Discussed epidural, and patient consents to the procedure:  included risk of possible  headache,backache, failed block, allergic reaction, and nerve injury. This patient was asked if she had any questions or concerns before the procedure started.)        Anesthesia Quick Evaluation

## 2018-08-19 NOTE — Progress Notes (Signed)
Subjective: Pt comfortable.  Foley bulb out with one pull.  Objective: BP 112/66   Pulse 77   Temp 98.2 F (36.8 C) (Oral)   Resp 18   Ht 5\' 6"  (1.676 m)   Wt 129.6 kg   LMP 11/12/2017   SpO2 98%   BMI 46.12 kg/m  No intake/output data recorded. No intake/output data recorded.  FHT: Category 1 FHT 120 accels, no decels.  Variability present UC:   irregular, every 4 minutes SVE:   Dilation: 5 Effacement (%): 70 Station: -2 Exam by:: nprothero,cnm Pitocin at will start   Assessment:  23 y.o., G1P0 at 40+0 weeks IOL for increased BMI of 43.9 Category 1 tracing Plan: Start pitocin augmentation. Discussed possible need for IUPC Epidural as needed  Kenney Houseman CNM, MSN 08/19/2018, 8:27 AM

## 2018-08-19 NOTE — Progress Notes (Signed)
Labor Progress Note Stacy Mills is a 23 y.o. female, G1P0 at 40+0 weeks, here for IOL for elevated BMI 43.9. She is having a baby boy, Stacy Mills, for outpatient circumcision. Planning to breastfeed.  Subjective:  Stacy Mills is awake and having some cramping but was recently able to nap for a while after the discomfort of having the Cook's Cath placed dissipated.   Objective:  BP 129/72   Pulse (!) 110   Temp 98.4 F (36.9 C) (Oral)   Resp 18   Ht 5\' 6"  (1.676 m)   Wt 129.6 kg   LMP 11/12/2017   SpO2 98%   BMI 46.12 kg/m    FHT: NICHD category 1, moderate variability, accels present, decels absent. UC:   None tracing SVE:   Deferred, last exam 0.5/50/-2 @0200  Pitocin at n/a MVUs n/a  Assessment/Plan:  Fetal Wellbeing: Reassuring tracing, decels resolved Labor: Cook's cath with 60cc in uterine balloon, placed 0344. Traction placed on catheter with this assessment, seems firmly in place. Will proceed with either misoprostol or pitocin now, per day shift provider. Can also attempt to inflate vaginal balloon if patient tolerates and/or add to uterine balloon up to 80cc, as tolerated. All options have been discussed with patient. Preeclampsia:  normotensive, denies sx GBS:  neg Pain Control:  desires epidural later in labor Anticipated MOD:  SVD    Jonetta Speak, CNM 08/19/2018, 6:17 AM

## 2018-08-19 NOTE — Anesthesia Procedure Notes (Signed)
Epidural Patient location during procedure: OB Start time: 08/19/2018 11:02 AM End time: 08/19/2018 11:35 AM  Staffing Anesthesiologist: Bethena Midget, MD  Preanesthetic Checklist Completed: patient identified, site marked, surgical consent, pre-op evaluation, timeout performed, IV checked, risks and benefits discussed and monitors and equipment checked  Epidural Patient position: sitting Prep: site prepped and draped and DuraPrep Patient monitoring: continuous pulse ox and blood pressure Approach: midline Location: L3-L4 Injection technique: LOR air  Needle:  Needle type: Tuohy  Needle gauge: 17 G Needle length: 9 cm and 9 Needle insertion depth: 9 cm Catheter type: closed end flexible Catheter size: 19 Gauge Catheter at skin depth: 14 cm Test dose: negative  Assessment Events: blood not aspirated, injection not painful, no injection resistance, negative IV test and no paresthesia

## 2018-08-19 NOTE — Progress Notes (Signed)
Subjective: Pt resting with epidural.    Objective: BP 109/70   Pulse 85   Temp 98.3 F (36.8 C) (Oral)   Resp 20   Ht 5\' 6"  (1.676 m)   Wt 129.6 kg   LMP 11/12/2017   SpO2 100%   BMI 46.12 kg/m  No intake/output data recorded. Total I/O In: -  Out: 250 [Urine:250]  FHT: Category 2 FHT 145 with variable decels to 120 UC:   regular, every 2-4 minutes SVE:   6/100/-1 per nprothero CNM Pitocin at 12 mu MVUs 130  Assessment:  23 y.o., G1P0at 40+0weeks IOL for increased BMI of 43.9 Category2tracing  Plan: Will do amnio infusion for variables. Anticipate SVD  Kenney Houseman CNM, MSN 08/19/2018, 4:46 PM

## 2018-08-19 NOTE — Progress Notes (Signed)
Subjective: Pt sleeping.  Objective: BP 112/63   Pulse 88   Temp 98.6 F (37 C) (Axillary)   Resp 18   Ht 5\' 6"  (1.676 m)   Wt 129.6 kg   LMP 11/12/2017   SpO2 100%   BMI 46.12 kg/m  No intake/output data recorded. No intake/output data recorded.  FHT: Category 1 FHT 125 accels, occ variable decels, variability present,  UC:   regular, every 2-4 minutes SVE:   Dilation: 6 Effacement (%): 80 Station: -2 Exam by:: NProthero,cnm Pitocin at10   Assessment:  23 y.o., G1P0at 40+0weeks IOL for increased BMI of 43.9 Category1tracing  Plan: Continue to titrate pitocin  Kenney Houseman CNM, MSN 08/19/2018, 3:17 PM

## 2018-08-19 NOTE — Progress Notes (Signed)
Labor Progress Note Stacy Halstonis a 23 y.o.female, G1P0at 40+0weeks, here for IOL for elevated BMI 43.9.  Called to pt room for prolonged decel/discontinuous tracing @1857  during sign out. FSE placed by Stacy Mills. Pt repositioned and given ephedrine for b/p 90/46. Amnioinfusion still infusing. FHT recovered to baseline with minimal decels.  Subjective:  Stacy Mills is unaware of her contractions, good pain block but retains good motor function. Her mother at the bedside is tearful because of the team rushing in to resuscitate the fetus. Support provided and both were calm and resting after baby recovered.  Objective:  BP 110/70   Pulse 81   Temp 97.8 F (36.6 C) (Oral)   Resp 20   Ht 5\' 6"  (1.676 m)   Wt 129.6 kg   LMP 11/12/2017   SpO2 100%   BMI 46.12 kg/m    FHT: Baseline 140, moderate variability, variable decels, no accels, cat 2 UC:   3 in 10 minutes SVE:   Dilation: 6 Effacement (%): 100 Station: -2 Exam by:: Rzhang,rnc-ob Pitocin at 12 MVUs 160  Assessment/Plan:  Fetal Wellbeing: NICHD category 2 Labor: progressing, ROM x 8h Preeclampsia:  normotensive, no sx GBS:   neg Pain Control:  epidural Anticipated MOD:  SVD    Jonetta Speak, CNM 08/19/2018, 7:25 PM

## 2018-08-19 NOTE — H&P (Addendum)
Stacy Mills is a 23 y.o. female, G1P0 at 40+0 weeks, presenting for IOL for elevated BMI 43.9.  Patient Active Problem List   Diagnosis Date Noted  . Obesity complicating pregnancy in third trimester 08/19/2018  . Expectant parent prebirth pediatrician visit 06/12/2018  . Gonorrhea affecting pregnancy 02/05/2018  . Idiopathic intracranial hypertension   . Migraines 12/22/2014  . PID (acute pelvic inflammatory disease) 11/07/2012  . Hemorrhagic cyst of ovary 11/07/2012  . Severe obesity (BMI >= 40) (HCC) 08/26/2012    History of present pregnancy: Patient entered care at 6 weeks.   EDC of 08/19/18 was established by L/6.   Anatomy scan: performed by [facility], posterior placenta.   Additional [place] evaluations:  BPPs due to elevated BMI, all 8/8. [place] at 36wks for growth due to BMI, EFW 58th%ile.   Significant prenatal events:  Tx care from [facility] to [facility] at 24 weeks   Last evaluation:  ROB 08/16/18  OB History    Gravida  1   Para      Term      Preterm      AB  0   Living  0     SAB      TAB      Ectopic  0   Multiple      Live Births             Past Medical History:  Diagnosis Date  . Allergy   . Anxiety   . Depression   . HSV (herpes simplex virus) anogenital infection   . IIH (idiopathic intracranial hypertension)   . IUD (intrauterine device) in place    Paraguard placed 2014, removed 10/2015  . Migraine   . Pelvic inflammatory disease 10/2012   Past Surgical History:  Procedure Laterality Date  . spinal tap     Family History: family history includes Alcohol abuse in her maternal aunt; Anxiety disorder in her maternal grandfather; Asthma in her mother; Cancer in her paternal grandmother; Depression in her maternal grandfather; Diabetes in her maternal grandfather; Emphysema in her maternal grandfather; Hyperlipidemia in her maternal aunt; Hypertension in her father; Mental illness in her maternal grandfather; Non-Hodgkin's lymphoma in her  paternal grandmother; Sudden death in her paternal grandmother. Social History:  reports that she has quit smoking. She has never used smokeless tobacco. She reports previous alcohol use of about 2.0 standard drinks of alcohol per week. She reports previous drug use. Drugs: Other-see comments and Marijuana.   Prenatal Transfer Tool  Maternal Diabetes: No Genetic Screening: Normal Maternal Ultrasounds/Referrals: Normal Fetal Ultrasounds or other Referrals:  None Maternal Substance Abuse:  No Significant Maternal Medications:  Meds include: Other: Valtrex Significant Maternal Lab Results: Lab values include: Group B Strep negative  TDAP yes Flu yes  ROS:  All ten systems reviewed and negative, except as noted. Denies VB, LOF.  Denies headache, epigastric pain, and visual sx. Reports good FM.   Allergies  Allergen Reactions  . Gadolinium Derivatives Itching, Nausea Only and Cough  . Ivp Dye [Iodinated Diagnostic Agents]      Dilation: Fingertip Effacement (%): 50 Station: -2 Exam by:: B. Holliday RN Blood pressure 133/82, pulse 96, temperature 98.4 F (36.9 C), temperature source Oral, resp. rate 18, height 5\' 6"  (1.676 m), weight 129.6 kg, last menstrual period 11/12/2017, SpO2 98 %.   Results for orders placed or performed during the hospital encounter of 08/19/18 (from the past 24 hour(s))  CBC     Status: Abnormal  Collection Time: 08/19/18  1:08 AM  Result Value Ref Range   WBC 16.0 (H) 4.0 - 10.5 K/uL   RBC 4.16 3.87 - 5.11 MIL/uL   Hemoglobin 10.7 (L) 12.0 - 15.0 g/dL   HCT 81.8 (L) 29.9 - 37.1 %   MCV 82.9 80.0 - 100.0 fL   MCH 25.7 (L) 26.0 - 34.0 pg   MCHC 31.0 30.0 - 36.0 g/dL   RDW 69.6 78.9 - 38.1 %   Platelets 303 150 - 400 K/uL   nRBC 0.0 0.0 - 0.2 %  Type and screen     Status: None   Collection Time: 08/19/18  1:08 AM  Result Value Ref Range   ABO/RH(D) A POS    Antibody Screen NEG    Sample Expiration      08/22/2018 Performed at [facility], [address], [place], [place] [address]      Chest clear Heart RRR without murmur Abd gravid, NT Pelvic: deferred (checked by RN on admission) Ext: No signs or symptoms of DVT  FHR: Category 2, moderate variability, accels present, recurrent decels at 30-60 minute intervals lasting ~2 minutes. Decels too short to be prolonged, sometimes not in relation to ctxns.  UCs:  Not tracing well due to maternal habitus. occasional ctxns visible.  Prenatal labs: ABO, Rh: --/--/PENDING (03/22 0258) Antibody: PENDING (03/22 0108) Rubella:  Immune (09/12 0000) RPR: Nonreactive (09/12 0000)  HBsAg: Negative (09/12 0000)  HIV: Non-reactive (09/12 0000)  GBS: Negative (02/28 0000) Sickle cell/Hgb electrophoresis:  AA GC:  Negative now Chlamydia:  Negative now Genetic screenings:  Negative at [facility] per patient Glucola:  76 Hgb 13 at NOB, 11.7 at 28 weeks   Assessment: 23 y.o., G1P0 at 40+0 weeks IOL for increased BMI of 43.9 FT/50/-2 Category 2 tracing: Evaluate further before cytotec placement HSV2-speculum exam neg for lesions  Plan: Admit to Birthing Suite per consult with [facility] Routine [facility] orders Pain med/epidural prn Place foley balloon now Anticipate SVD  Jonetta Speak, CNM 08/19/2018, 2:39 AM

## 2018-08-20 ENCOUNTER — Encounter (HOSPITAL_COMMUNITY): Payer: Self-pay | Admitting: *Deleted

## 2018-08-20 LAB — CBC
HCT: 34.3 % — ABNORMAL LOW (ref 36.0–46.0)
HEMOGLOBIN: 10.5 g/dL — AB (ref 12.0–15.0)
MCH: 25.2 pg — ABNORMAL LOW (ref 26.0–34.0)
MCHC: 30.6 g/dL (ref 30.0–36.0)
MCV: 82.3 fL (ref 80.0–100.0)
Platelets: 292 10*3/uL (ref 150–400)
RBC: 4.17 MIL/uL (ref 3.87–5.11)
RDW: 14.1 % (ref 11.5–15.5)
WBC: 23.5 10*3/uL — ABNORMAL HIGH (ref 4.0–10.5)
nRBC: 0 % (ref 0.0–0.2)

## 2018-08-20 LAB — CREATININE, SERUM
Creatinine, Ser: 0.74 mg/dL (ref 0.44–1.00)
GFR calc Af Amer: >60 mL/min
GFR calc non Af Amer: >60 mL/min

## 2018-08-20 MED ORDER — NALBUPHINE HCL 10 MG/ML IJ SOLN: 5.0000 mg | INTRAMUSCULAR | Status: DC | PRN

## 2018-08-20 MED ORDER — SIMETHICONE 80 MG PO CHEW
80.0000 mg | CHEWABLE_TABLET | ORAL | Status: DC
  Administered 2018-08-20 – 2018-08-21 (×2): 80 mg via ORAL
  Filled 2018-08-20 (×2): qty 1

## 2018-08-20 MED ORDER — DIPHENHYDRAMINE HCL 25 MG PO CAPS: 25.0000 mg | ORAL_CAPSULE | Freq: Four times a day (QID) | ORAL | Status: DC | PRN

## 2018-08-20 MED ORDER — DIPHENHYDRAMINE HCL 50 MG/ML IJ SOLN: 12.5000 mg | INTRAMUSCULAR | Status: DC | PRN

## 2018-08-20 MED ORDER — LACTATED RINGERS IV SOLN
INTRAVENOUS | Status: DC
  Administered 2018-08-20: 749 mL via INTRAVENOUS
  Administered 2018-08-20: 12:00:00 via INTRAVENOUS

## 2018-08-20 MED ORDER — SODIUM CHLORIDE 0.9 % IV SOLN
INTRAVENOUS | Status: DC | PRN
  Administered 2018-08-20: via INTRAVENOUS

## 2018-08-20 MED ORDER — VALACYCLOVIR HCL 500 MG PO TABS
500.0000 mg | ORAL_TABLET | Freq: Every day | ORAL | Status: DC
  Administered 2018-08-20 – 2018-08-22 (×3): 500 mg via ORAL
  Filled 2018-08-20 (×3): qty 1

## 2018-08-20 MED ORDER — MEPERIDINE HCL 25 MG/ML IJ SOLN
INTRAMUSCULAR | Status: DC | PRN
  Administered 2018-08-20 (×2): 12.5 mg via INTRAVENOUS

## 2018-08-20 MED ORDER — ENOXAPARIN SODIUM 60 MG/0.6ML ~~LOC~~ SOLN
60.0000 mg | SUBCUTANEOUS | Status: DC
  Administered 2018-08-20 – 2018-08-21 (×2): 60 mg via SUBCUTANEOUS
  Filled 2018-08-20 (×2): qty 0.6

## 2018-08-20 MED ORDER — MORPHINE SULFATE (PF) 0.5 MG/ML IJ SOLN
INTRAMUSCULAR | Status: AC
  Filled 2018-08-20: qty 10

## 2018-08-20 MED ORDER — SODIUM CHLORIDE 0.9% FLUSH: 3.0000 mL | INTRAVENOUS | Status: DC | PRN

## 2018-08-20 MED ORDER — SIMETHICONE 80 MG PO CHEW: 80.0000 mg | CHEWABLE_TABLET | ORAL | Status: DC | PRN

## 2018-08-20 MED ORDER — OXYTOCIN 40 UNITS IN NORMAL SALINE INFUSION - SIMPLE MED
INTRAVENOUS | Status: AC
  Filled 2018-08-20: qty 1000

## 2018-08-20 MED ORDER — OXYCODONE HCL 5 MG/5ML PO SOLN: 5.0000 mg | Freq: Once | ORAL | Status: DC | PRN

## 2018-08-20 MED ORDER — ONDANSETRON HCL 4 MG/2ML IJ SOLN: 4.0000 mg | Freq: Once | INTRAMUSCULAR | Status: DC | PRN

## 2018-08-20 MED ORDER — MEPERIDINE HCL 25 MG/ML IJ SOLN: 6.2500 mg | INTRAMUSCULAR | Status: DC | PRN

## 2018-08-20 MED ORDER — ONDANSETRON HCL 4 MG/2ML IJ SOLN
INTRAMUSCULAR | Status: AC
  Filled 2018-08-20: qty 2

## 2018-08-20 MED ORDER — DEXAMETHASONE SODIUM PHOSPHATE 10 MG/ML IJ SOLN
INTRAMUSCULAR | Status: AC
  Filled 2018-08-20: qty 1

## 2018-08-20 MED ORDER — ACETAMINOPHEN 160 MG/5ML PO SOLN: 325.0000 mg | ORAL | Status: DC | PRN

## 2018-08-20 MED ORDER — DEXTROSE 5 % IV SOLN
INTRAVENOUS | Status: AC
  Filled 2018-08-20: qty 3000

## 2018-08-20 MED ORDER — DIBUCAINE 1 % RE OINT: 1.0000 "application " | TOPICAL_OINTMENT | RECTAL | Status: DC | PRN

## 2018-08-20 MED ORDER — NALOXONE HCL 4 MG/10ML IJ SOLN
1.0000 ug/kg/h | INTRAVENOUS | Status: DC | PRN
  Filled 2018-08-20: qty 5

## 2018-08-20 MED ORDER — NALBUPHINE HCL 10 MG/ML IJ SOLN: 5.0000 mg | Freq: Once | INTRAMUSCULAR | Status: DC | PRN

## 2018-08-20 MED ORDER — MEPERIDINE HCL 25 MG/ML IJ SOLN
INTRAMUSCULAR | Status: AC
  Filled 2018-08-20: qty 1

## 2018-08-20 MED ORDER — OXYTOCIN 10 UNIT/ML IJ SOLN
INTRAVENOUS | Status: DC | PRN
  Administered 2018-08-20: 40 [IU] via INTRAVENOUS

## 2018-08-20 MED ORDER — FENTANYL CITRATE (PF) 100 MCG/2ML IJ SOLN
25.0000 ug | INTRAMUSCULAR | Status: DC | PRN
  Administered 2018-08-20 (×2): 50 ug via INTRAVENOUS

## 2018-08-20 MED ORDER — SCOPOLAMINE 1 MG/3DAYS TD PT72: 1.0000 | MEDICATED_PATCH | Freq: Once | TRANSDERMAL | Status: DC

## 2018-08-20 MED ORDER — KETOROLAC TROMETHAMINE 30 MG/ML IJ SOLN: 30.0000 mg | Freq: Four times a day (QID) | INTRAMUSCULAR | Status: AC | PRN

## 2018-08-20 MED ORDER — WITCH HAZEL-GLYCERIN EX PADS: 1.0000 "application " | MEDICATED_PAD | CUTANEOUS | Status: DC | PRN

## 2018-08-20 MED ORDER — OXYCODONE HCL 5 MG PO TABS: 5.0000 mg | ORAL_TABLET | Freq: Once | ORAL | Status: DC | PRN

## 2018-08-20 MED ORDER — DEXAMETHASONE SODIUM PHOSPHATE 10 MG/ML IJ SOLN
INTRAMUSCULAR | Status: DC | PRN
  Administered 2018-08-20: 10 mg via INTRAVENOUS

## 2018-08-20 MED ORDER — COCONUT OIL OIL: 1.0000 "application " | TOPICAL_OIL | Status: DC | PRN

## 2018-08-20 MED ORDER — DEXTROSE 5 % IV SOLN
INTRAVENOUS | Status: DC | PRN
  Administered 2018-08-19: 3 g via INTRAVENOUS

## 2018-08-20 MED ORDER — ONDANSETRON HCL 4 MG/2ML IJ SOLN
INTRAMUSCULAR | Status: DC | PRN
  Administered 2018-08-20: 4 mg via INTRAVENOUS

## 2018-08-20 MED ORDER — LIDOCAINE-EPINEPHRINE (PF) 2 %-1:200000 IJ SOLN
INTRAMUSCULAR | Status: AC
  Filled 2018-08-20: qty 10

## 2018-08-20 MED ORDER — MENTHOL 3 MG MT LOZG: 1.0000 | LOZENGE | OROMUCOSAL | Status: DC | PRN

## 2018-08-20 MED ORDER — SENNOSIDES-DOCUSATE SODIUM 8.6-50 MG PO TABS
2.0000 | ORAL_TABLET | ORAL | Status: DC
  Administered 2018-08-20 – 2018-08-21 (×2): 2 via ORAL
  Filled 2018-08-20 (×2): qty 2

## 2018-08-20 MED ORDER — ZOLPIDEM TARTRATE 5 MG PO TABS: 5.0000 mg | ORAL_TABLET | Freq: Every evening | ORAL | Status: DC | PRN

## 2018-08-20 MED ORDER — ACETAMINOPHEN 325 MG PO TABS: 325.0000 mg | ORAL_TABLET | ORAL | Status: DC | PRN

## 2018-08-20 MED ORDER — SCOPOLAMINE 1 MG/3DAYS TD PT72
MEDICATED_PATCH | TRANSDERMAL | Status: AC
  Filled 2018-08-20: qty 1

## 2018-08-20 MED ORDER — DIPHENHYDRAMINE HCL 25 MG PO CAPS: 25.0000 mg | ORAL_CAPSULE | ORAL | Status: DC | PRN

## 2018-08-20 MED ORDER — OXYTOCIN 40 UNITS IN NORMAL SALINE INFUSION - SIMPLE MED: 2.5000 [IU]/h | INTRAVENOUS | Status: AC

## 2018-08-20 MED ORDER — IBUPROFEN 600 MG PO TABS
600.0000 mg | ORAL_TABLET | Freq: Four times a day (QID) | ORAL | Status: DC | PRN
  Administered 2018-08-20 – 2018-08-22 (×4): 600 mg via ORAL
  Filled 2018-08-20 (×5): qty 1

## 2018-08-20 MED ORDER — ONDANSETRON HCL 4 MG/2ML IJ SOLN: 4.0000 mg | Freq: Three times a day (TID) | INTRAMUSCULAR | Status: DC | PRN

## 2018-08-20 MED ORDER — LIDOCAINE-EPINEPHRINE (PF) 2 %-1:200000 IJ SOLN
INTRAMUSCULAR | Status: DC | PRN
  Administered 2018-08-19: 10 mL via EPIDURAL

## 2018-08-20 MED ORDER — HYDROCODONE-ACETAMINOPHEN 5-325 MG PO TABS: 1.0000 | ORAL_TABLET | ORAL | Status: DC | PRN

## 2018-08-20 MED ORDER — FENTANYL CITRATE (PF) 100 MCG/2ML IJ SOLN
INTRAMUSCULAR | Status: AC
  Filled 2018-08-20: qty 2

## 2018-08-20 MED ORDER — SCOPOLAMINE 1 MG/3DAYS TD PT72
MEDICATED_PATCH | TRANSDERMAL | Status: DC | PRN
  Administered 2018-08-20: 1 via TRANSDERMAL

## 2018-08-20 MED ORDER — TETANUS-DIPHTH-ACELL PERTUSSIS 5-2.5-18.5 LF-MCG/0.5 IM SUSP: 0.5000 mL | Freq: Once | INTRAMUSCULAR | Status: DC

## 2018-08-20 MED ORDER — PRENATAL MULTIVITAMIN CH
1.0000 | ORAL_TABLET | Freq: Every day | ORAL | Status: DC
  Administered 2018-08-20 – 2018-08-21 (×2): 1 via ORAL
  Filled 2018-08-20 (×2): qty 1

## 2018-08-20 MED ORDER — NALOXONE HCL 0.4 MG/ML IJ SOLN: 0.4000 mg | INTRAMUSCULAR | Status: DC | PRN

## 2018-08-20 MED ORDER — SIMETHICONE 80 MG PO CHEW
80.0000 mg | CHEWABLE_TABLET | Freq: Three times a day (TID) | ORAL | Status: DC
  Administered 2018-08-20 – 2018-08-22 (×7): 80 mg via ORAL
  Filled 2018-08-20 (×7): qty 1

## 2018-08-20 NOTE — Progress Notes (Signed)
Simultaneous orders of LR and of pitocin (running for 16 hours) starting at 0400.  Patient came over at 0330 with a bag of Pitocin with 15 mls left; hung LR. After reviewing orders, called Leafy Kindle, CNM to clarify if both should be running.  Recommended for now to keep just LR running until she could clarify with Dr Normand Sloop.

## 2018-08-20 NOTE — Anesthesia Postprocedure Evaluation (Signed)
Anesthesia Post Note  Patient: Stacy Mills  Procedure(s) Performed: CESAREAN SECTION (N/A )     Patient location during evaluation: Mother Baby Anesthesia Type: Epidural Level of consciousness: awake Pain management: satisfactory to patient Vital Signs Assessment: post-procedure vital signs reviewed and stable Respiratory status: spontaneous breathing Cardiovascular status: stable Anesthetic complications: no    Last Vitals:  Vitals:   08/20/18 0445 08/20/18 0625  BP: 135/83 135/83  Pulse: 98 78  Resp: 18 18  Temp: 36.6 C 36.8 C  SpO2: 94% 92%    Last Pain:  Vitals:   08/20/18 0625  TempSrc: Oral  PainSc: 0-No pain   Pain Goal: Patients Stated Pain Goal: 3 (08/20/18 0130)                 Cephus Shelling

## 2018-08-20 NOTE — Op Note (Addendum)
Cesarean Section Procedure Note   Stacy Mills  08/19/2018 - 08/20/2018  Indications: Dystocia and FETAL INTOLERANCE TO LABOR   Pre-operative Diagnosis: CESAREAN SECTION.   Post-operative Diagnosis: Same   Surgeon: Surgeon(s) and Role:    * Jaymes Graff, MD - Primary   Assistants: C. HARTSOG CNM   Anesthesia: epidural   Procedure Details:  The patient was seen in the Holding Room. The risks, benefits, complications, treatment options, and expected outcomes were discussed with the patient. The patient concurred with the proposed plan, giving informed consent. identified as [name] and the procedure verified as C-Section Delivery. A Time Out was held and the above information confirmed.  After induction of anesthesia, the patient was draped and prepped in the usual sterile manner. A transverse incision was made and carried down through the subcutaneous tissue to the fascia. Fascial incision was made in the midline and extended transversely. The fascia was separated from the underlying rectus muscle superiorly and inferiorly. The peritoneum was identified and entered. Peritoneal incision was extended longitudinally with good visualization of bowel and bladder. The utero-vesical peritoneal reflection was incised transversely and the bladder flap was bluntly freed from the lower uterine segment.  An alexsis retractor was placed in the abdomen.   A low transverse uterine incision was made. Delivered from cephalic presentation was a  infant, with Apgar scores of 8 at one minute and 9 at five minutes. Cord ph was sent the umbilical cord was clamped and cut cord blood was obtained for evaluation. The placenta was removed Intact and appeared normal. The uterine outline, tubes and ovaries appeared normal}. The uterine incision was closed with running locked sutures of 0Vicryl. A second layer 0 vicrlyl was used to imbricate the uterine incision    Hemostasis was observed. Lavage was carried out  until clear. A FIGHURE OF 8 WAS PLACED ON THE RIGHT SIDE OF THE INCISION TO STOP OOZING The alexsis was removed.  The peritoneum was closed with 0 chromic.  The muscles were examined and any bleeders were made hemostatic using bovie cautery device.   The fascia was then reapproximated with running sutures of 0 vicryl. 10 JP PLACED IN THE SUBCUTANEOUS TISSUE.    The subcutaneous tissue was reapproximated  With interrupted stitches using 2-0 plain gut. The subcuticular closure was performed using 3-26monocryl     Instrument, sponge, and needle counts were correct prior the abdominal closure and were correct at the conclusion of the case.    Findings: infant was delivered from VTX presentation. The fluid was CLEAR.  The uterus tubes and ovaries appeared normal.     Estimated Blood Loss: 184 CC   Total IV Fluids:   Urine Output: 1000CC OF clear urine  Specimens: PLACENTA TO PATH  Complications: no complications  Disposition: PACU - hemodynamically stable.   Maternal Condition: stable   Baby condition / location:  Couplet care / Skin to Skin  Attending Attestation: I performed the procedure.   Signed: Surgeon(s): Jaymes Graff, MD

## 2018-08-20 NOTE — Lactation Note (Addendum)
This note was copied from a baby's chart. Lactation Consultation Note  Patient Name: Boy Diahann Lynd HUDJS'H Date: 08/20/2018   Baby boy [place] now 25 hours old delivered via csection to G1 mom. Mom with obesity and no medical problems she reports.  Medical record reports anxiety/depression. Mom reported she took a breastfeeding class at [facility].   Mom reports she has a DEBP at home.  Mom reports she is having trouble positioning him because he just wants to eat his hands. Mom reports she had nipple rings in both nipples and that she had to have the left one surgically removed.  Assisted with positioning/latch. Mom has large breast but nipples evert well.  Assisted mom with breastfeeding slightly laid back and infant latched and breastfed with some rythmic sucking.  Mom reports comfort.  Nipple round and elongated when infant came off. Infant does not appear to lift tongue much at this time when crying. Able to hand express a few drops of colostrum and fed infant via spoon.  Left sts.  Urged to feed on cue and 8 or more times day. Gave [facility]ation [facility] Handouts and [facility] support Group Handouts and Reviewed Breastfeeding Section of Understanding Mother and Baby booklet. Urged mom to call Lactation as needed.  Maternal Data    Feeding    LATCH Score                   Interventions    Lactation Tools Discussed/Used     Consult Status      Nathanyal Ashmead Michaelle Copas 08/20/2018, 2:24 PM

## 2018-08-20 NOTE — Transfer of Care (Signed)
Immediate Anesthesia Transfer of Care Note  Patient: Stacy Mills  Procedure(s) Performed: CESAREAN SECTION (N/A )  Patient Location: PACU  Anesthesia Type:Epidural  Level of Consciousness: awake, alert , oriented and patient cooperative  Airway & Oxygen Therapy: Patient Spontanous Breathing  Post-op Assessment: Report given to RN, Post -op Vital signs reviewed and stable and Patient moving all extremities X 4  Post vital signs: Reviewed and stable  Last Vitals:  Vitals Value Taken Time  BP 129/74 08/20/2018  1:31 AM  Temp    Pulse 94 08/20/2018  1:32 AM  Resp 15 08/20/2018  1:32 AM  SpO2 100 % 08/20/2018  1:32 AM  Vitals shown include unvalidated device data.  Last Pain:  Vitals:   08/19/18 2145  TempSrc: Oral  PainSc:          Complications: No apparent anesthesia complications

## 2018-08-21 MED ORDER — ONDANSETRON HCL 4 MG PO TABS
8.0000 mg | ORAL_TABLET | Freq: Three times a day (TID) | ORAL | Status: DC | PRN
  Filled 2018-08-21: qty 2

## 2018-08-21 MED ORDER — ONDANSETRON HCL 4 MG PO TABS: 8.0000 mg | ORAL_TABLET | Freq: Once | ORAL | Status: DC

## 2018-08-21 NOTE — Progress Notes (Signed)
Subjective: Postpartum Day 1: Cesarean Delivery Patient reports incisional pain, tolerating PO and no problems voiding.    Objective: Vitals:   08/20/18 1828 08/20/18 2144 08/20/18 2301 08/21/18 0700  BP: 115/71 (!) 95/58 98/61 104/72  Pulse: 74 83 79 69  Resp: 16 18 18 18   Temp: 98 F (36.7 C) 98.1 F (36.7 C) 98.2 F (36.8 C) 98.4 F (36.9 C)  TempSrc: Oral Oral Oral Oral  SpO2: 100% 97% 98%   Weight:      Height:        Physical Exam:  General: alert and cooperative Lochia: appropriate Uterine Fundus: firm Incision: healing well, no significant drainage, no dehiscence, no significant erythema, JP drain in place and draining  DVT Evaluation: No evidence of DVT seen on physical exam. Negative Homan's sign. No cords or calf tenderness. No significant calf/ankle edema.  Recent Labs    08/19/18 0108 08/20/18 0642  HGB 10.7* 10.5*  HCT 34.5* 34.3*    Assessment/Plan: Status post Cesarean section. Doing well postoperatively.  Continue current care.  Janeece Riggers 08/21/2018, 7:58 AM

## 2018-08-21 NOTE — Lactation Note (Signed)
This note was copied from a baby's chart. Lactation Consultation Note  Patient Name: Stacy Mills [id number] Date: 08/21/2018 Reason for consult: Follow-up assessment;Term Baby is 37 hours old/5% weight loss.  Mom feels latch has improved although latch scores 6-7.  Baby is currently sleeping.  Instructed to watch for feeding cues and call for LC assist.  Maternal Data    Feeding    LATCH Score                   Interventions    Lactation Tools Discussed/Used     Consult Status Consult Status: Follow-up Date: 08/22/18 Follow-up type: In-patient    Stacy Mills 08/21/2018, 2:17 PM

## 2018-08-22 MED ORDER — HYDROCODONE-ACETAMINOPHEN 5-325 MG PO TABS: 1.0000 | ORAL_TABLET | Freq: Four times a day (QID) | ORAL | 0 refills | Status: AC | PRN

## 2018-08-22 MED ORDER — IBUPROFEN 600 MG PO TABS: 600.0000 mg | ORAL_TABLET | Freq: Four times a day (QID) | ORAL | 0 refills | Status: DC | PRN

## 2018-08-22 NOTE — Discharge Instructions (Signed)
Cesarean Delivery °Cesarean birth, or cesarean delivery, is the surgical delivery of a baby through an incision in the abdomen and the uterus. This may be referred to as a C-section. This procedure may be scheduled ahead of time, or it may be done in an emergency situation. °Tell a health care provider about: °· Any allergies you have. °· All medicines you are taking, including vitamins, herbs, eye drops, creams, and over-the-counter medicines. °· Any problems you or family members have had with anesthetic medicines. °· Any blood disorders you have. °· Any surgeries you have had. °· Any medical conditions you have. °· Whether you or any members of your family have a history of deep vein thrombosis (DVT) or pulmonary embolism (PE). °What are the risks? °Generally, this is a safe procedure. However, problems may occur, including: °· Infection. °· Bleeding. °· Allergic reactions to medicines. °· Damage to other structures or organs. °· Blood clots. °· Injury to your baby. °What happens before the procedure? °General instructions °· Follow instructions from your health care provider about eating or drinking restrictions. °· If you know that you are going to have a cesarean delivery, do not shave your pubic area. Shaving before the procedure may increase your risk of infection. °· Plan to have someone take you home from the hospital. °· Ask your health care provider what steps will be taken to prevent infection. These may include: °? Removing hair at the surgery site. °? Washing skin with a germ-killing soap. °? Taking antibiotic medicine. °· Depending on the reason for your cesarean delivery, you may have a physical exam or additional testing, such as an ultrasound. °· You may have your blood or urine tested. °Questions for your health care provider °· Ask your health care provider about: °? Changing or stopping your regular medicines. This is especially important if you are taking diabetes medicines or blood  thinners. °? Your pain management plan. This is especially important if you plan to breastfeed your baby. °? How long you will be in the hospital after the procedure. °? Any concerns you may have about receiving blood products, if you need them during the procedure. °? Cord blood banking, if you plan to collect your baby's umbilical cord blood. °· You may also want to ask your health care provider: °? Whether you will be able to hold or breastfeed your baby while you are still in the operating room. °? Whether your baby can stay with you immediately after the procedure and during your recovery. °? Whether a family member or a person of your choice can go with you into the operating room and stay with you during the procedure, immediately after the procedure, and during your recovery. °What happens during the procedure? ° °· An IV will be inserted into one of your veins. °· Fluid and medicines, such as antibiotics, will be given before the surgery. °· Fetal monitors will be placed on your abdomen to check your baby's heart rate. °· You may be given a special warming gown to wear to keep your temperature stable. °· A catheter may be inserted into your bladder through your urethra. This drains your urine during the procedure. °· You may be given one or more of the following: °? A medicine to numb the area (local anesthetic). °? A medicine to make you fall asleep (general anesthetic). °? A medicine (regional anesthetic) that is injected into your back or through a small thin tube placed in your back (spinal anesthetic or epidural anesthetic).   This numbs everything below the injection site and allows you to stay awake during your procedure. If this makes you feel nauseous, tell your health care provider. Medicines will be available to help reduce any nausea you may feel. °· An incision will be made in your abdomen, and then in your uterus. °· If you are awake during your procedure, you may feel tugging and pulling in  your abdomen, but you should not feel pain. If you feel pain, tell your health care provider immediately. °· Your baby will be removed from your uterus. You may feel more pressure or pushing while this happens. °· Immediately after birth, your baby will be dried and kept warm. You may be able to hold and breastfeed your baby. °· The umbilical cord may be clamped and cut during this time. This usually occurs after waiting a period of 1-2 minutes after delivery. °· Your placenta will be removed from your uterus. °· Your incisions will be closed with stitches (sutures). Staples, skin glue, or adhesive strips may also be applied to the incision in your abdomen. °· Bandages (dressings) may be placed over the incision in your abdomen. °The procedure may vary among health care providers and hospitals. °What happens after the procedure? °· Your blood pressure, heart rate, breathing rate, and blood oxygen level will be monitored until you are discharged from the hospital. °· You may continue to receive fluids and medicines through an IV. °· You will have some pain. Medicines will be available to help control your pain. °· To help prevent blood clots: °? You may be given medicines. °? You may have to wear compression stockings or devices. °? You will be encouraged to walk around when you are able. °· Hospital staff will encourage and support bonding with your baby. Your hospital may have you and your baby to stay in the same room (rooming in) during your hospital stay to encourage successful bonding and breastfeeding. °· You may be encouraged to cough and breathe deeply often. This helps to prevent lung problems. °· If you have a catheter draining your urine, it will be removed as soon as possible after your procedure. °Summary °· Cesarean birth, or cesarean delivery, is the surgical delivery of a baby through an incision in the abdomen and the uterus. °· Follow instructions from your health care provider about eating or  drinking restrictions before the procedure. °· You will have some pain after the procedure. Medicines will be available to help control your pain. °· Hospital staff will encourage and support bonding with your baby after the procedure. Your hospital may have you and your baby to stay in the same room (rooming in) during your hospital stay to encourage successful bonding and breastfeeding. °This information is not intended to replace advice given to you by your health care provider. Make sure you discuss any questions you have with your health care provider. °Document Released: 05/16/2005 Document Revised: 11/20/2017 Document Reviewed: 11/20/2017 °Elsevier Interactive Patient Education © 2019 [employer] ° °Postpartum Baby Blues °The postpartum period begins right after the birth of a baby. During this time, there is often a lot of joy and excitement. It is also a time of many changes in the life of the parents. No matter how many times a mother gives birth, each child brings new challenges to the family, including different ways of relating to one another. °It is common to have feelings of excitement along with confusing changes in moods, emotions, and thoughts. You may feel   happy one minute and sad or stressed the next. These feelings of sadness usually happen in the period right after you have your baby, and they go away within a week or two. This is called the "baby blues." °What are the causes? °There is no known cause of baby blues. It is likely caused by a combination of factors. However, changes in hormone levels after childbirth are believed to trigger some of the symptoms. °Other factors that can play a role in these mood changes include: °· Lack of sleep. °· Stressful life events, such as poverty, caring for a loved one, or death of a loved one. °· Genetics. °What are the signs or symptoms? °Symptoms of this condition include: °· Brief changes in mood, such as going from extreme happiness to  sadness. °· Decreased concentration. °· Difficulty sleeping. °· Crying spells and tearfulness. °· Loss of appetite. °· Irritability. °· Anxiety. °If the symptoms of baby blues last for more than 2 weeks or become more severe, you may have postpartum depression. °How is this diagnosed? °This condition is diagnosed based on an evaluation of your symptoms. There are no medical or lab tests that lead to a diagnosis, but there are various questionnaires that a health care provider may use to identify women with the baby blues or postpartum depression. °How is this treated? °Treatment is not needed for this condition. The baby blues usually go away on their own in 1-2 weeks. Social support is often all that is needed. You will be encouraged to get adequate sleep and rest. °Follow these instructions at home: °Lifestyle ° °  ° °· Get as much rest as you can. Take a nap when the baby sleeps. °· Exercise regularly as told by your health care provider. Some women find yoga and walking to be helpful. °· Eat a balanced and nourishing diet. This includes plenty of fruits and vegetables, whole grains, and lean proteins. °· Do little things that you enjoy. Have a cup of tea, take a bubble bath, read your favorite magazine, or listen to your favorite music. °· Avoid alcohol. °· Ask for help with household chores, cooking, grocery shopping, or running errands. Do not try to do everything yourself. Consider hiring a postpartum doula to help. This is a professional who specializes in providing support to new mothers. °· Try not to make any major life changes during pregnancy or right after giving birth. This can add stress. °General instructions °· Talk to people close to you about how you are feeling. Get support from your partner, family members, friends, or other new moms. You may want to join a support group. °· Find ways to cope with stress. This may include: °? Writing your thoughts and feelings in a journal. °? Spending time  outside. °? Spending time with people who make you laugh. °· Try to stay positive in how you think. Think about the things you are grateful for. °· Take over-the-counter and prescription medicines only as told by your health care provider. °· Let your health care provider know if you have any concerns. °· Keep all postpartum visits as told by your health care provider. This is important. °Contact a health care provider if: °· Your baby blues do not go away after 2 weeks. °Get help right away if: °· You have thoughts of taking your own life (suicidal thoughts). °· You think you may harm the baby or other people. °· You see or hear things that are not there (hallucinations). °Summary °· After   giving birth, you may feel happy one minute and sad or stressed the next. Feelings of sadness that happen right after the baby is born and go away after a week or two are called the "baby blues." °· You can manage the baby blues by getting enough rest, eating a healthy diet, exercising, spending time with supportive people, and finding ways to cope with stress. °· If feelings of sadness and stress last longer than 2 weeks or get in the way of caring for your baby, talk to your health care provider. This may mean you have postpartum depression. °This information is not intended to replace advice given to you by your health care provider. Make sure you discuss any questions you have with your health care provider. °Document Released: 02/18/2004 Document Revised: 07/12/2016 Document Reviewed: 07/12/2016 °Elsevier Interactive Patient Education © 2019 Elsevier Inc. ° °

## 2018-08-22 NOTE — Discharge Summary (Signed)
OB Discharge Summary     Patient Name: Stacy Mills DOB: [birth year] MRN: [id number]  Date of admission: 08/19/2018 Delivering MD: Jaymes Graff   Date of discharge: 08/22/2018  Admitting diagnosis: pregnancy Intrauterine pregnancy: [redacted]w[redacted]d     Secondary diagnosis:  Active Problems:   Obesity complicating pregnancy in third trimester      Discharge diagnosis: Term Pregnancy Delivered                                                                                                Post partum procedures:n/a  Augmentation: AROM, Pitocin and Foley Balloon  Complications: None  Hospital course:  Induction of Labor With Cesarean Section  23 y.o. yo G1P1001 at [redacted]w[redacted]d was admitted to the hospital 08/19/2018 for induction of labor. Patient had a labor course significant for fetal intolerance and failed descent. The patient went for cesarean section due to Arrest of Descent and Non-Reassuring FHR, and delivered a Viable infant,08/20/2018  Membrane Rupture Time/Date: 11:40 AM ,08/19/2018   Details of operation can be found in separate operative Note.  Patient had an uncomplicated postpartum course. She is ambulating, tolerating a regular diet, passing flatus, and urinating well.  Patient is discharged home in stable condition on 08/22/18.                                    Physical exam  Vitals:   08/20/18 2301 08/21/18 0700 08/21/18 1351 08/21/18 2151  BP: 98/61 104/72 119/69 108/71  Pulse: 79 69 83 80  Resp: 18 18 18    Temp: 98.2 F (36.8 C) 98.4 F (36.9 C) 98.7 F (37.1 C) 97.7 F (36.5 C)  TempSrc: Oral Oral Oral   SpO2: 98%   100%  Weight:      Height:       General: alert, cooperative and no distress Lochia: appropriate Uterine Fundus: firm Incision: Healing well with no significant drainage, No significant erythema, Dressing is clean, dry, and intact DVT Evaluation: No evidence of DVT seen on physical exam. Negative Homan's sign. No cords or calf tenderness. No significant  calf/ankle edema. Labs: Lab Results  Component Value Date   WBC 23.5 (H) 08/20/2018   HGB 10.5 (L) 08/20/2018   HCT 34.3 (L) 08/20/2018   MCV 82.3 08/20/2018   PLT 292 08/20/2018   CMP Latest Ref Rng & Units 08/20/2018  Glucose 70 - 99 mg/dL -  BUN 6 - 20 mg/dL -  Creatinine 1.17 - 3.56 mg/dL 7.01  Sodium 410 - 301 mmol/L -  Potassium 3.5 - 5.1 mmol/L -  Chloride 98 - 111 mmol/L -  CO2 22 - 32 mmol/L -  Calcium 8.9 - 10.3 mg/dL -  Total Protein 6.5 - 8.1 g/dL -  Total Bilirubin 0.3 - 1.2 mg/dL -  Alkaline Phos 38 - 314 U/L -  AST 15 - 41 U/L -  ALT 0 - 44 U/L -    Discharge instruction: per After Visit Summary and "Baby and Me Booklet".  After visit meds:  Allergies as  of 08/22/2018      Reactions   Ivp Dye [iodinated Diagnostic Agents] Itching, Nausea Only   Contrast for MRI      Medication List    TAKE these medications   Eucrisa 2 % Oint Generic drug:  Crisaborole Apply 1 application topically daily.   HYDROcodone-acetaminophen 5-325 MG tablet Commonly known as:  NORCO/VICODIN Take 1 tablet by mouth every 6 (six) hours as needed for up to 7 days for severe pain.   ibuprofen 600 MG tablet Commonly known as:  ADVIL,MOTRIN Take 1 tablet (600 mg total) by mouth every 6 (six) hours as needed for moderate pain.   prenatal multivitamin Tabs tablet Take 1 tablet by mouth daily at 12 noon.   valACYclovir 500 MG tablet Commonly known as:  VALTREX Take 500 mg by mouth daily.       Diet: routine diet  Activity: Advance as tolerated. Pelvic rest for 6 weeks.   Outpatient follow up:6 weeks Follow up Appt:No future appointments. Follow up Visit:No follow-ups on file.  Postpartum contraception: IUD undecided  Newborn Data: Live born female  Birth Weight: 7 lb 0.9 oz (3201 g) APGAR: 8, 9  Newborn Delivery   Birth date/time:  08/20/2018 00:26:00 Delivery type:  C-Section, Low Transverse Trial of labor:  Yes C-section categorization:  Primary     Baby  Feeding: Breast Disposition:home with mother   08/22/2018 Janeece Riggers, CNM

## 2018-09-14 ENCOUNTER — Telehealth: Payer: Self-pay | Admitting: Nurse Practitioner

## 2018-09-14 NOTE — Telephone Encounter (Signed)
Called on behalf of Stacy Mills because it has been a while since patient has been seen and wanted to know if she would like to schedule a virtual appt, left message

## 2018-09-26 DIAGNOSIS — Z113 Encounter for screening for infections with a predominantly sexual mode of transmission: Secondary | ICD-10-CM | POA: Diagnosis not present

## 2018-09-26 DIAGNOSIS — Z4801 Encounter for change or removal of surgical wound dressing: Secondary | ICD-10-CM | POA: Diagnosis not present

## 2018-10-09 DIAGNOSIS — E669 Obesity, unspecified: Secondary | ICD-10-CM | POA: Diagnosis not present

## 2018-10-09 DIAGNOSIS — Z30430 Encounter for insertion of intrauterine contraceptive device: Secondary | ICD-10-CM | POA: Diagnosis not present

## 2019-01-22 ENCOUNTER — Ambulatory Visit (INDEPENDENT_AMBULATORY_CARE_PROVIDER_SITE_OTHER): Payer: 59 | Admitting: Nurse Practitioner

## 2019-01-22 ENCOUNTER — Other Ambulatory Visit (HOSPITAL_COMMUNITY)
Admission: RE | Admit: 2019-01-22 | Discharge: 2019-01-22 | Disposition: A | Discharge status: Home / Self Care | Payer: 59 | Source: Ambulatory Visit | Attending: Nurse Practitioner | Admitting: Nurse Practitioner

## 2019-01-22 ENCOUNTER — Encounter: Payer: Self-pay | Admitting: Nurse Practitioner

## 2019-01-22 ENCOUNTER — Other Ambulatory Visit: Payer: Self-pay

## 2019-01-22 VITALS — BP 122/88 | HR 89 | Temp 98.0°F | Ht 66.0 in | Wt 295.0 lb

## 2019-01-22 DIAGNOSIS — N760 Acute vaginitis: Secondary | ICD-10-CM | POA: Insufficient documentation

## 2019-01-22 DIAGNOSIS — A749 Chlamydial infection, unspecified: Secondary | ICD-10-CM

## 2019-01-22 DIAGNOSIS — Z23 Encounter for immunization: Secondary | ICD-10-CM

## 2019-01-22 MED ORDER — METRONIDAZOLE 0.75 % VA GEL: 1.0000 | Freq: Every day | VAGINAL | 0 refills | Status: DC

## 2019-01-22 NOTE — Progress Notes (Addendum)
Subjective:  Patient ID: Stacy Mills, female    DOB: 05/30/1995  Age: 23 y.o. MRN: [id number]  CC: Vaginitis (pt is c/o of odor,itchy,painful at times/ change soap recently/ ok for flu shot)  Vaginal Discharge The patient's primary symptoms include genital itching, a genital odor and vaginal discharge. The patient's pertinent negatives include no genital lesions, genital rash, missed menses, pelvic pain or vaginal bleeding. This is a new problem. The current episode started in the past 7 days. The problem occurs constantly. The problem has been unchanged. The patient is experiencing no pain. She is not pregnant. Associated symptoms include frequency. Pertinent negatives include no back pain, chills, flank pain, nausea, painful intercourse, rash or urgency. The vaginal discharge was milky and thin. The vaginal bleeding is typical of menses. She has not been passing clots. She has not been passing tissue. Exacerbated by: change in soap. She has tried nothing for the symptoms. She is not sexually active. She uses an IUD for contraception. Her past medical history is significant for an STD. There is no history of endometriosis, herpes simplex, menorrhagia, metrorrhagia, miscarriage, PID or vaginosis.   Reviewed past Medical, Social and Family history today.  Outpatient Medications Prior to Visit  Medication Sig Dispense Refill  . Crisaborole (EUCRISA) 2 % OINT Apply 1 application topically daily.     Marland Kitchen. ibuprofen (ADVIL,MOTRIN) 600 MG tablet Take 1 tablet (600 mg total) by mouth every 6 (six) hours as needed for moderate pain. 30 tablet 0  . valACYclovir (VALTREX) 500 MG tablet Take 500 mg by mouth daily.     . Prenatal Vit-Fe Fumarate-FA (PRENATAL MULTIVITAMIN) TABS tablet Take 1 tablet by mouth daily at 12 noon.     No facility-administered medications prior to visit.     ROS See HPI  Objective:  BP 122/88   Pulse 89   Temp 98 F (36.7 C) (Oral)   Ht 5\' 6"  (1.676 m)   Wt 295 lb (133.8  kg)   LMP 10/01/2018 (Within Days)   SpO2 99%   BMI 47.61 kg/m   BP Readings from Last 3 Encounters:  01/22/19 122/88  08/21/18 108/71  08/14/18 134/76    Wt Readings from Last 3 Encounters:  01/22/19 295 lb (133.8 kg)  08/19/18 285 lb 11.5 oz (129.6 kg)  08/14/18 286 lb (129.7 kg)    Physical Exam Vitals signs reviewed. Exam conducted with a chaperone present.  Genitourinary:    Vagina: Vaginal discharge and erythema present. No tenderness or bleeding.     Cervix: Discharge and erythema present.     Adnexa: Right adnexa normal and left adnexa normal.     Lab Results  Component Value Date   WBC 23.5 (H) 08/20/2018   HGB 10.5 (L) 08/20/2018   HCT 34.3 (L) 08/20/2018   PLT 292 08/20/2018   GLUCOSE 99 08/14/2018   CHOL 154 05/15/2017   TRIG 73.0 05/15/2017   HDL 64.20 05/15/2017   LDLCALC 75 05/15/2017   ALT 9 08/14/2018   AST 17 08/14/2018   NA 137 08/14/2018   K 3.9 08/14/2018   CL 108 08/14/2018   CREATININE 0.74 08/20/2018   BUN 8 08/14/2018   CO2 21 (L) 08/14/2018   TSH 0.81 05/15/2017   HGBA1C 5.4 05/09/2016    Assessment & Plan:   Stacy Mills was seen today for vaginitis.  Diagnoses and all orders for this visit:  Acute vaginitis -     metroNIDAZOLE (METROGEL) 0.75 % vaginal gel; Place 1 Applicatorful vaginally at  bedtime. -     Cervicovaginal ancillary only( [place]) -     fluconazole (DIFLUCAN) 150 MG tablet; Take 1 tablet (150 mg total) by mouth daily. Take second tab 3days apart from first tab -     azithromycin (ZITHROMAX) 500 MG tablet; Take 2 tablets (1,000 mg total) by mouth once for 1 dose.  Need for influenza vaccination -     Flu Vaccine QUAD 36+ mos IM  Chlamydia infection -     azithromycin (ZITHROMAX) 500 MG tablet; Take 2 tablets (1,000 mg total) by mouth once for 1 dose. -     Urine cytology ancillary only([place]); Future   I am having Stacy Mills start on metroNIDAZOLE, fluconazole, and azithromycin. I am also having  her maintain her Crisaborole, valACYclovir, prenatal multivitamin, and ibuprofen.  Meds ordered this encounter  Medications  . metroNIDAZOLE (METROGEL) 0.75 % vaginal gel    Sig: Place 1 Applicatorful vaginally at bedtime.    Dispense:  70 g    Refill:  0    Order Specific Question:   Supervising Provider    Answer:   Lucille Passy [[id number]]  . fluconazole (DIFLUCAN) 150 MG tablet    Sig: Take 1 tablet (150 mg total) by mouth daily. Take second tab 3days apart from first tab    Dispense:  2 tablet    Refill:  0    Order Specific Question:   Supervising Provider    Answer:   Lucille Passy [[id number]]  . azithromycin (ZITHROMAX) 500 MG tablet    Sig: Take 2 tablets (1,000 mg total) by mouth once for 1 dose.    Dispense:  2 tablet    Refill:  0    Order Specific Question:   Supervising Provider    Answer:   Lucille Passy [[id number]]    Problem List Items Addressed This Visit    None    Visit Diagnoses    Acute vaginitis    -  Primary   Relevant Medications   metroNIDAZOLE (METROGEL) 0.75 % vaginal gel   fluconazole (DIFLUCAN) 150 MG tablet   azithromycin (ZITHROMAX) 500 MG tablet   Other Relevant Orders   Cervicovaginal ancillary only( [place]) (Completed)   Need for influenza vaccination       Relevant Orders   Flu Vaccine QUAD 36+ mos IM (Completed)   Chlamydia infection       Relevant Medications   fluconazole (DIFLUCAN) 150 MG tablet   azithromycin (ZITHROMAX) 500 MG tablet   Other Relevant Orders   Urine cytology ancillary only([place])       Follow-up: Return in about 3 months (around 04/24/2019) for CPE (fasting).  Stacy Lacy, NP

## 2019-01-22 NOTE — Patient Instructions (Signed)
Vaginitis  Vaginitis is irritation and swelling (inflammation) of the vagina. It happens when normal bacteria and yeast in the vagina grow too much. There are many types of this condition. Treatment will depend on the type you have. Follow these instructions at home: Lifestyle  Keep your vagina area clean and dry. ? Avoid using soap. ? Rinse the area with water.  Do not do the following until your doctor says it is okay: ? Wash and clean out the vagina (douche). ? Use tampons. ? Have sex.  Wipe from front to back after going to the bathroom.  Let air reach your vagina. ? Wear cotton underwear. ? Do not wear: ? Underwear while you sleep. ? Tight pants. ? Thong underwear. ? Underwear or nylons without a cotton panel. ? Take off any wet clothing, such as bathing suits, as soon as possible.  Use gentle, non-scented products. Do not use things that can irritate the vagina, such as fabric softeners. Avoid the following products if they are scented: ? Feminine sprays. ? Detergents. ? Tampons. ? Feminine hygiene products. ? Soaps or bubble baths.  Practice safe sex and use condoms. General instructions  Take over-the-counter and prescription medicines only as told by your doctor.  If you were prescribed an antibiotic medicine, take or use it as told by your doctor. Do not stop taking or using the antibiotic even if you start to feel better.  Keep all follow-up visits as told by your doctor. This is important. Contact a doctor if:  You have pain in your belly.  You have a fever.  Your symptoms last for more than 2-3 days. Get help right away if:  You have a fever and your symptoms get worse all of a sudden. Summary  Vaginitis is irritation and swelling of the vagina. It can happen when the normal bacteria and yeast in the vagina grow too much. There are many types.  Treatment will depend on the type you have.  Do not douche, use tampons , or have sex until your health  care provider approves. When you can return to sex, practice safe sex and use condoms. This information is not intended to replace advice given to you by your health care provider. Make sure you discuss any questions you have with your health care provider. Document Released: 08/12/2008 Document Revised: 04/28/2017 Document Reviewed: 06/07/2016 Elsevier Patient Education  2020 Else[employer] Inc.  

## 2019-01-25 LAB — CERVICOVAGINAL ANCILLARY ONLY
Bacterial vaginitis: POSITIVE — AB
Candida vaginitis: POSITIVE — AB
Chlamydia: POSITIVE — AB
Neisseria Gonorrhea: NEGATIVE
Trichomonas: NEGATIVE

## 2019-01-28 MED ORDER — AZITHROMYCIN 500 MG PO TABS: 1000.0000 mg | ORAL_TABLET | Freq: Once | ORAL | 0 refills | Status: AC

## 2019-01-28 MED ORDER — FLUCONAZOLE 150 MG PO TABS: 150.0000 mg | ORAL_TABLET | Freq: Every day | ORAL | 0 refills | Status: DC

## 2019-01-28 NOTE — Addendum Note (Signed)
Addended by: Leana Gamer on: 01/28/2019 08:19 AM   Modules accepted: Orders

## 2019-02-03 ENCOUNTER — Encounter: Payer: Self-pay | Admitting: Nurse Practitioner

## 2019-02-03 DIAGNOSIS — N760 Acute vaginitis: Secondary | ICD-10-CM

## 2019-02-03 DIAGNOSIS — B9689 Other specified bacterial agents as the cause of diseases classified elsewhere: Secondary | ICD-10-CM

## 2019-02-05 ENCOUNTER — Telehealth: Payer: Self-pay | Admitting: Nurse Practitioner

## 2019-02-05 MED ORDER — METRONIDAZOLE 500 MG PO TABS: 500.0000 mg | ORAL_TABLET | Freq: Two times a day (BID) | ORAL | 0 refills | Status: DC

## 2019-02-05 NOTE — Telephone Encounter (Signed)

## 2019-02-06 ENCOUNTER — Ambulatory Visit: Payer: 59 | Admitting: Nurse Practitioner

## 2019-02-06 ENCOUNTER — Other Ambulatory Visit (INDEPENDENT_AMBULATORY_CARE_PROVIDER_SITE_OTHER): Payer: 59

## 2019-02-06 ENCOUNTER — Other Ambulatory Visit (HOSPITAL_COMMUNITY)
Admission: RE | Admit: 2019-02-06 | Discharge: 2019-02-06 | Disposition: A | Discharge status: Home / Self Care | Payer: 59 | Source: Ambulatory Visit | Attending: Nurse Practitioner | Admitting: Nurse Practitioner

## 2019-02-06 DIAGNOSIS — A749 Chlamydial infection, unspecified: Secondary | ICD-10-CM | POA: Diagnosis present

## 2019-02-08 LAB — URINE CYTOLOGY ANCILLARY ONLY
Chlamydia: POSITIVE — AB
Neisseria Gonorrhea: NEGATIVE
Trichomonas: NEGATIVE

## 2019-02-08 MED ORDER — DOXYCYCLINE HYCLATE 100 MG PO TABS: 100.0000 mg | ORAL_TABLET | Freq: Two times a day (BID) | ORAL | 0 refills | Status: AC

## 2019-02-08 NOTE — Addendum Note (Signed)
Addended by: Wilfred Lacy L on: 02/08/2019 12:25 PM   Modules accepted: Orders

## 2019-03-17 ENCOUNTER — Ambulatory Visit (INDEPENDENT_AMBULATORY_CARE_PROVIDER_SITE_OTHER)
Admission: RE | Admit: 2019-03-17 | Discharge: 2019-03-17 | Disposition: A | Discharge status: Home / Self Care | Payer: 59 | Source: Ambulatory Visit

## 2019-03-17 ENCOUNTER — Encounter: Payer: Self-pay | Admitting: Nurse Practitioner

## 2019-03-17 DIAGNOSIS — L02419 Cutaneous abscess of limb, unspecified: Secondary | ICD-10-CM | POA: Diagnosis not present

## 2019-03-17 DIAGNOSIS — L03119 Cellulitis of unspecified part of limb: Secondary | ICD-10-CM | POA: Diagnosis not present

## 2019-03-17 MED ORDER — DOXYCYCLINE HYCLATE 100 MG PO CAPS: 100.0000 mg | ORAL_CAPSULE | Freq: Two times a day (BID) | ORAL | 0 refills | Status: AC

## 2019-03-17 NOTE — ED Provider Notes (Signed)
Virtual Visit via Video Note:  Stacy Mills  initiated request for Telemedicine visit with [facility] Urgent Care team. I connected with Stacy Mills  on 03/17/2019 at 2:22 PM  for a synchronized telemedicine visit using a video enabled HIPPA compliant telemedicine application. I verified that I am speaking with Stacy Mills  using two identifiers. Stacy Papaleo C Bona Hubbard, PA-C  was physically located in a [facility] Urgent care site and Stacy Mills was located at a different location.   The limitations of evaluation and management by telemedicine as well as the availability of in-person appointments were discussed. Patient was informed that she  may incur a bill ( including co-pay) for this virtual visit encounter. Stacy Mills  expressed understanding and gave verbal consent to proceed with virtual visit.     History of Present Illness:Stacy Mills  is a 23 y.o. female presents for evaluation of possible spider bite.  Patient states that 2 nights ago she was outside and believes she was bit by something.  She initially thought it was a mosquito.  Over the past 2 days she has developed increased redness pain and swelling to an area on her right thigh.  She notes that the central area has become larger and more yellow.  Denies fevers chills or body aches.  Denies headaches, nausea or vomiting.   Allergies  Allergen Reactions  . Ivp Dye [Iodinated Diagnostic Agents] Itching and Nausea Only    Contrast for MRI     Past Medical History:  Diagnosis Date  . Allergy   . Anxiety   . Depression   . HSV (herpes simplex virus) anogenital infection   . IIH (idiopathic intracranial hypertension)   . IUD (intrauterine device) in place    Paraguard placed 2014, removed 10/2015  . Migraine   . Pelvic inflammatory disease 10/2012     Social History   Tobacco Use  . Smoking status: Former Games developer  . Smokeless tobacco: Never Used  Substance Use Topics  . Alcohol use: Not Currently   Alcohol/week: 2.0 standard drinks    Types: 2 Shots of liquor per week    Comment: social  . Drug use: Not Currently    Types: Other-see comments, Marijuana        Observations/Objective: Physical Exam  Constitutional: She is oriented to person, place, and time and well-developed, well-nourished, and in no distress. No distress.  HENT:  Head: Normocephalic and atraumatic.  Neck: Normal range of motion.  Pulmonary/Chest: Effort normal. No respiratory distress.  Speaking in full sentences  Musculoskeletal:     Comments: Moving extremities appropriately  Neurological: She is alert and oriented to person, place, and time.  Speech clear, face symmetric  Skin:  Anterior aspect of right anterior thigh with area of erythema with central raised pocket of pus appearing material     Assessment and Plan:    ICD-10-CM   1. Cellulitis and abscess of leg  L03.119    L02.419     Patient appears to have cellulitis versus abscess to leg, possible secondary to bite.  Will treat with doxycycline and recommend warm compresses.  Advised pocket of pus may rupture, continue to monitor.  If symptoms progressing and worsening, not draining on its own may need to follow-up in person.  No systemic symptoms.  Patient stable.  Discussed strict return precautions. Patient verbalized understanding and is agreeable with plan.    Follow Up Instructions:     I discussed the assessment and treatment plan with the patient.  The patient was provided an opportunity to ask questions and all were answered. The patient agreed with the plan and demonstrated an understanding of the instructions.   The patient was advised to call back or seek an in-person evaluation if the symptoms worsen or if the condition fails to improve as anticipated.      Stacy Lima, PA-C  03/17/2019 2:22 PM         Debara Pickett C, PA-C 03/17/19 1422

## 2019-03-17 NOTE — Discharge Instructions (Signed)
Doxycycline twice daily for 10 days Use anti-inflammatories for pain/swelling. You may take up to 800 mg Ibuprofen every 8 hours with food. You may supplement Ibuprofen with Tylenol [phone] mg every 8 hours.   Warm compresses to area  Follow up in person if not resolving.

## 2019-03-19 ENCOUNTER — Other Ambulatory Visit: Payer: Self-pay

## 2019-03-19 DIAGNOSIS — Z20822 Contact with and (suspected) exposure to covid-19: Secondary | ICD-10-CM

## 2019-03-20 ENCOUNTER — Encounter: Payer: Self-pay | Admitting: Nurse Practitioner

## 2019-03-20 LAB — NOVEL CORONAVIRUS, NAA: SARS-CoV-2, NAA: DETECTED — AB

## 2019-03-25 ENCOUNTER — Telehealth: Payer: Self-pay | Admitting: Internal Medicine

## 2019-03-25 NOTE — Telephone Encounter (Signed)
Left message for patient to call back  

## 2019-04-02 ENCOUNTER — Encounter: Payer: Self-pay | Admitting: Nurse Practitioner

## 2019-04-02 ENCOUNTER — Telehealth: Payer: Self-pay | Admitting: Nurse Practitioner

## 2019-04-02 NOTE — Telephone Encounter (Signed)
Letter written

## 2019-04-02 NOTE — Telephone Encounter (Signed)
Pt request out of work note from 03/19/2019-04/02/2019 due to positive Covid 19 test result on 03/19/2019. Pt denied of any symptoms as of today but her employer request out of work note (or else pt will be terminate from her work). [place] please advise.    Copied from [place] [id number]. Topic: General - Other >> Apr 02, 2019  1:25 PM Wynetta Emery, [place] C wrote: Reason for CRM: pt called in to request a excuse note for work? Pt tested for covid on 10/20, she would like to be excused through- 11/20 for quarantine. Pt says that her result for covid was positive.    Please assist.

## 2019-04-03 NOTE — Telephone Encounter (Signed)
Pt is aware.  

## 2019-05-10 ENCOUNTER — Telehealth: Payer: 59

## 2019-05-29 ENCOUNTER — Other Ambulatory Visit: Payer: Self-pay

## 2019-05-29 ENCOUNTER — Emergency Department (HOSPITAL_COMMUNITY): Payer: 59

## 2019-05-29 ENCOUNTER — Encounter (HOSPITAL_COMMUNITY): Payer: Self-pay | Admitting: Emergency Medicine

## 2019-05-29 DIAGNOSIS — Z8249 Family history of ischemic heart disease and other diseases of the circulatory system: Secondary | ICD-10-CM

## 2019-05-29 DIAGNOSIS — Z6841 Body Mass Index (BMI) 40.0 and over, adult: Secondary | ICD-10-CM | POA: Diagnosis not present

## 2019-05-29 DIAGNOSIS — N179 Acute kidney failure, unspecified: Secondary | ICD-10-CM | POA: Diagnosis not present

## 2019-05-29 DIAGNOSIS — F329 Major depressive disorder, single episode, unspecified: Secondary | ICD-10-CM | POA: Diagnosis present

## 2019-05-29 DIAGNOSIS — T85520D Displacement of bile duct prosthesis, subsequent encounter: Secondary | ICD-10-CM | POA: Diagnosis not present

## 2019-05-29 DIAGNOSIS — Z91041 Radiographic dye allergy status: Secondary | ICD-10-CM

## 2019-05-29 DIAGNOSIS — Z79899 Other long term (current) drug therapy: Secondary | ICD-10-CM

## 2019-05-29 DIAGNOSIS — Z20822 Contact with and (suspected) exposure to covid-19: Secondary | ICD-10-CM | POA: Diagnosis present

## 2019-05-29 DIAGNOSIS — Z825 Family history of asthma and other chronic lower respiratory diseases: Secondary | ICD-10-CM | POA: Diagnosis not present

## 2019-05-29 DIAGNOSIS — F419 Anxiety disorder, unspecified: Secondary | ICD-10-CM | POA: Diagnosis not present

## 2019-05-29 DIAGNOSIS — Z79891 Long term (current) use of opiate analgesic: Secondary | ICD-10-CM

## 2019-05-29 DIAGNOSIS — K760 Fatty (change of) liver, not elsewhere classified: Secondary | ICD-10-CM | POA: Diagnosis not present

## 2019-05-29 DIAGNOSIS — Z8616 Personal history of COVID-19: Secondary | ICD-10-CM | POA: Diagnosis not present

## 2019-05-29 DIAGNOSIS — K8064 Calculus of gallbladder and bile duct with chronic cholecystitis without obstruction: Secondary | ICD-10-CM | POA: Diagnosis present

## 2019-05-29 DIAGNOSIS — Z87891 Personal history of nicotine dependence: Secondary | ICD-10-CM

## 2019-05-29 DIAGNOSIS — Z807 Family history of other malignant neoplasms of lymphoid, hematopoietic and related tissues: Secondary | ICD-10-CM

## 2019-05-29 DIAGNOSIS — Z833 Family history of diabetes mellitus: Secondary | ICD-10-CM

## 2019-05-29 DIAGNOSIS — Z818 Family history of other mental and behavioral disorders: Secondary | ICD-10-CM | POA: Diagnosis not present

## 2019-05-29 DIAGNOSIS — Z791 Long term (current) use of non-steroidal anti-inflammatories (NSAID): Secondary | ICD-10-CM

## 2019-05-29 DIAGNOSIS — K7689 Other specified diseases of liver: Principal | ICD-10-CM | POA: Diagnosis present

## 2019-05-29 DIAGNOSIS — K8000 Calculus of gallbladder with acute cholecystitis without obstruction: Secondary | ICD-10-CM | POA: Diagnosis not present

## 2019-05-29 DIAGNOSIS — Z975 Presence of (intrauterine) contraceptive device: Secondary | ICD-10-CM | POA: Diagnosis not present

## 2019-05-29 DIAGNOSIS — K750 Abscess of liver: Secondary | ICD-10-CM | POA: Diagnosis not present

## 2019-05-29 NOTE — ED Triage Notes (Signed)
Patient reports pressure at site where drain was placed in gallbladder x2 weeks ago. States surgery was in [place] and additional surgery scheduled for 1/5 in [place] for gallstone removal as well. Reports pain worsens with deep breathing. Denies chest pain and SOB.

## 2019-05-30 ENCOUNTER — Emergency Department (HOSPITAL_COMMUNITY): Payer: 59

## 2019-05-30 ENCOUNTER — Encounter (HOSPITAL_COMMUNITY): Payer: Self-pay

## 2019-05-30 ENCOUNTER — Observation Stay (HOSPITAL_COMMUNITY)
Admission: EM | Admit: 2019-05-30 | Discharge: 2019-05-31 | DRG: 442 | Disposition: A | Discharge status: Home / Self Care | Payer: 59 | Attending: Internal Medicine | Admitting: Internal Medicine

## 2019-05-30 ENCOUNTER — Other Ambulatory Visit: Payer: Self-pay

## 2019-05-30 DIAGNOSIS — R1011 Right upper quadrant pain: Secondary | ICD-10-CM

## 2019-05-30 DIAGNOSIS — K750 Abscess of liver: Secondary | ICD-10-CM | POA: Diagnosis not present

## 2019-05-30 DIAGNOSIS — K8040 Calculus of bile duct with cholecystitis, unspecified, without obstruction: Secondary | ICD-10-CM | POA: Diagnosis not present

## 2019-05-30 DIAGNOSIS — R109 Unspecified abdominal pain: Secondary | ICD-10-CM

## 2019-05-30 LAB — LIPASE, BLOOD: Lipase: 45 U/L (ref 11–51)

## 2019-05-30 LAB — CBC WITH DIFFERENTIAL/PLATELET
Abs Immature Granulocytes: 0.04 10*3/uL (ref 0.00–0.07)
Basophils Absolute: 0.1 10*3/uL (ref 0.0–0.1)
Basophils Relative: 1 %
Eosinophils Absolute: 0.3 10*3/uL (ref 0.0–0.5)
Eosinophils Relative: 3 %
HCT: 41 % (ref 36.0–46.0)
Hemoglobin: 12.9 g/dL (ref 12.0–15.0)
Immature Granulocytes: 0 %
Lymphocytes Relative: 37 %
Lymphs Abs: 4.1 10*3/uL — ABNORMAL HIGH (ref 0.7–4.0)
MCH: 27.6 pg (ref 26.0–34.0)
MCHC: 31.5 g/dL (ref 30.0–36.0)
MCV: 87.8 fL (ref 80.0–100.0)
Monocytes Absolute: 1 10*3/uL (ref 0.1–1.0)
Monocytes Relative: 9 %
Neutro Abs: 5.6 10*3/uL (ref 1.7–7.7)
Neutrophils Relative %: 50 %
Platelets: 552 10*3/uL — ABNORMAL HIGH (ref 150–400)
RBC: 4.67 MIL/uL (ref 3.87–5.11)
RDW: 12.9 % (ref 11.5–15.5)
WBC: 11.1 10*3/uL — ABNORMAL HIGH (ref 4.0–10.5)
nRBC: 0 % (ref 0.0–0.2)

## 2019-05-30 LAB — COMPREHENSIVE METABOLIC PANEL
ALT: 92 U/L — ABNORMAL HIGH (ref 0–44)
AST: 135 U/L — ABNORMAL HIGH (ref 15–41)
Albumin: 3.6 g/dL (ref 3.5–5.0)
Alkaline Phosphatase: 129 U/L — ABNORMAL HIGH (ref 38–126)
Anion gap: 9 (ref 5–15)
BUN: 9 mg/dL (ref 6–20)
CO2: 22 mmol/L (ref 22–32)
Calcium: 9.3 mg/dL (ref 8.9–10.3)
Chloride: 109 mmol/L (ref 98–111)
Creatinine, Ser: 0.89 mg/dL (ref 0.44–1.00)
GFR calc Af Amer: >60 mL/min (ref >60)
GFR calc non Af Amer: >60 mL/min (ref >60)
Glucose, Bld: 103 mg/dL — ABNORMAL HIGH (ref 70–99)
Potassium: 3.8 mmol/L (ref 3.5–5.1)
Sodium: 140 mmol/L (ref 135–145)
Total Bilirubin: 0.4 mg/dL (ref 0.3–1.2)
Total Protein: 7.7 g/dL (ref 6.5–8.1)

## 2019-05-30 LAB — HCG, QUANTITATIVE, PREGNANCY: hCG, Beta Chain, Quant, S: <1 m[IU]/mL (ref <5)

## 2019-05-30 LAB — PROTIME-INR
INR: 1.1 (ref 0.8–1.2)
Prothrombin Time: 13.7 seconds (ref 11.4–15.2)

## 2019-05-30 LAB — SARS CORONAVIRUS 2 (TAT 6-24 HRS): SARS Coronavirus 2: NEGATIVE

## 2019-05-30 LAB — HIV ANTIBODY (ROUTINE TESTING W REFLEX): HIV Screen 4th Generation wRfx: NONREACTIVE

## 2019-05-30 MED ORDER — GADOBUTROL 1 MMOL/ML IV SOLN
10.0000 mL | Freq: Once | INTRAVENOUS | Status: AC | PRN
  Administered 2019-05-30: 13:00:00 10 mL via INTRAVENOUS

## 2019-05-30 MED ORDER — ACETAMINOPHEN 650 MG RE SUPP: 650.0000 mg | Freq: Four times a day (QID) | RECTAL | Status: DC | PRN

## 2019-05-30 MED ORDER — PIPERACILLIN-TAZOBACTAM 3.375 G IVPB
3.3750 g | Freq: Three times a day (TID) | INTRAVENOUS | Status: DC
  Administered 2019-05-31 (×3): 3.375 g via INTRAVENOUS
  Filled 2019-05-30 (×3): qty 50

## 2019-05-30 MED ORDER — SODIUM CHLORIDE (PF) 0.9 % IJ SOLN
INTRAMUSCULAR | Status: AC
  Filled 2019-05-30: qty 50

## 2019-05-30 MED ORDER — CRISABOROLE 2 % EX OINT: 1.0000 "application " | TOPICAL_OINTMENT | Freq: Every day | CUTANEOUS | Status: DC | PRN

## 2019-05-30 MED ORDER — PIPERACILLIN-TAZOBACTAM 3.375 G IVPB 30 MIN: 3.3750 g | Freq: Once | INTRAVENOUS | Status: DC

## 2019-05-30 MED ORDER — ACETAMINOPHEN 325 MG PO TABS: 650.0000 mg | ORAL_TABLET | Freq: Four times a day (QID) | ORAL | Status: DC | PRN

## 2019-05-30 MED ORDER — PIPERACILLIN-TAZOBACTAM 3.375 G IVPB 30 MIN
3.3750 g | Freq: Once | INTRAVENOUS | Status: AC
  Administered 2019-05-30: 17:00:00 3.375 g via INTRAVENOUS
  Filled 2019-05-30: qty 50

## 2019-05-30 MED ORDER — DOCUSATE SODIUM 100 MG PO CAPS: 100.0000 mg | ORAL_CAPSULE | Freq: Two times a day (BID) | ORAL | Status: DC | PRN

## 2019-05-30 MED ORDER — OXYCODONE-ACETAMINOPHEN 5-325 MG PO TABS: 1.0000 | ORAL_TABLET | ORAL | Status: DC | PRN

## 2019-05-30 MED ORDER — BACLOFEN 10 MG PO TABS: 10.0000 mg | ORAL_TABLET | Freq: Two times a day (BID) | ORAL | Status: DC | PRN

## 2019-05-30 MED ORDER — SODIUM CHLORIDE 0.9% FLUSH
3.0000 mL | Freq: Two times a day (BID) | INTRAVENOUS | Status: DC
  Administered 2019-05-31: 3 mL via INTRAVENOUS

## 2019-05-30 MED ORDER — IOHEXOL 300 MG/ML  SOLN
100.0000 mL | Freq: Once | INTRAMUSCULAR | Status: AC | PRN
  Administered 2019-05-30: 05:00:00 100 mL via INTRAVENOUS

## 2019-05-30 MED ORDER — SODIUM CHLORIDE 0.9 % IV SOLN: INTRAVENOUS | Status: AC

## 2019-05-30 MED ORDER — DIPHENHYDRAMINE HCL 50 MG/ML IJ SOLN
50.0000 mg | Freq: Once | INTRAMUSCULAR | Status: AC
  Administered 2019-05-30: 12:00:00 50 mg via INTRAVENOUS
  Filled 2019-05-30: qty 1

## 2019-05-30 MED ORDER — ONDANSETRON HCL 4 MG/2ML IJ SOLN: 4.0000 mg | Freq: Four times a day (QID) | INTRAMUSCULAR | Status: DC | PRN

## 2019-05-30 MED ORDER — HYDRALAZINE HCL 10 MG PO TABS
10.0000 mg | ORAL_TABLET | Freq: Three times a day (TID) | ORAL | Status: DC | PRN
  Administered 2019-05-30: 17:00:00 10 mg via ORAL
  Filled 2019-05-30: qty 1

## 2019-05-30 MED ORDER — ONDANSETRON HCL 4 MG PO TABS: 4.0000 mg | ORAL_TABLET | Freq: Four times a day (QID) | ORAL | Status: DC | PRN

## 2019-05-30 NOTE — Consult Note (Addendum)
Reason for Consult:cholelithiasis, biliary drain in place Referring Physician: Dr Melburn Popper is an 23 y.o. female.  HPI: 12 yof who is otherwise healthy presents with complicated gallbladder issues.  She has recently moved to outside of , [place].  She began having some epigastric abdominal pain earlier this month while in [place]. Was evaluated at [facility] and found to have gallstones and told she would need gb out at some point. She then moved to [place] where she began having significant epigastric pain that led her to er. She states was admitted for a week. She states did not have pancreatitis. It sounds like she had choledocholithiasis and had unsuccessful attempt at an ERCP. She then had a ptc placed and was better with abx. She was discharged home with plans to clear her duct at some point with gi there I assume with ercp using tube.  Since then she came to [facility] for holidays and began having worse pain around her catheter. This is worse with deep breaths.  She has been eating but appetite not normal.  She is having bms.  She was seen in er quite a while ago and has undergone multiple studies at this point: 1. US shows small stones, no sono murphys sign, 4.7 mm wall with fluid around gb, cbd is 7.2 mm 2. Ct scan shows perc biliary drain to tip of duodenum, partially distended gb with stones and fluid, 4 cm right liver lesion possible abscess 3. MRI (not mrcp) with possible pseuodcyst, some mildly enlarged nodes, adjacent fluid collection to gallbladder, small abscess right liver and drain in place.  I was asked to see her Was covid positive 03/19/19  Past Medical History:  Diagnosis Date  . Allergy   . Anxiety   . Depression   . HSV (herpes simplex virus) anogenital infection   . IIH (idiopathic intracranial hypertension)   . IUD (intrauterine device) in place    Paraguard placed 2014, removed 10/2015  . Migraine   . Pelvic inflammatory disease 10/2012    Past Surgical History:   Procedure Laterality Date  . CESAREAN SECTION N/A 08/19/2018   Procedure: CESAREAN SECTION;  Surgeon: Crawford Givens, MD;  Location: [facility] LD ORS;  Service: Obstetrics;  Laterality: N/A;  . spinal tap      Family History  Problem Relation Age of Onset  . Emphysema Maternal Grandfather   . Depression Maternal Grandfather   . Anxiety disorder Maternal Grandfather   . Mental illness Maternal Grandfather   . Diabetes Maternal Grandfather   . Cancer Paternal Grandmother   . Non-Hodgkin's lymphoma Paternal Grandmother   . Sudden death Paternal Grandmother   . Asthma Mother   . Hypertension Father   . Alcohol abuse Maternal Aunt   . Hyperlipidemia Maternal Aunt     Social History:  reports that she has quit smoking. She has never used smokeless tobacco. She reports previous alcohol use of about 2.0 standard drinks of alcohol per week. She reports previous drug use. Drugs: Other-see comments and Marijuana.  Allergies:  Allergies  Allergen Reactions  . Ivp Dye [Iodinated Diagnostic Agents] Itching and Nausea Only    Contrast for MRI    Medications: I have reviewed the patient's current medications.  Results for orders placed or performed during the hospital encounter of 05/30/19 (from the past 48 hour(s))  CBC with Differential     Status: Abnormal   Collection Time: 05/30/19  1:03 AM  Result Value Ref Range   WBC 11.1 (  H) 4.0 - 10.5 K/uL   RBC 4.67 3.87 - 5.11 MIL/uL   Hemoglobin 12.9 12.0 - 15.0 g/dL   HCT 41.0 36.0 - 46.0 %   MCV 87.8 80.0 - 100.0 fL   MCH 27.6 26.0 - 34.0 pg   MCHC 31.5 30.0 - 36.0 g/dL   RDW 12.9 11.5 - 15.5 %   Platelets 552 (H) 150 - 400 K/uL   nRBC 0.0 0.0 - 0.2 %   Neutrophils Relative % 50 %   Neutro Abs 5.6 1.7 - 7.7 K/uL   Lymphocytes Relative 37 %   Lymphs Abs 4.1 (H) 0.7 - 4.0 K/uL   Monocytes Relative 9 %   Monocytes Absolute 1.0 0.1 - 1.0 K/uL   Eosinophils Relative 3 %   Eosinophils Absolute 0.3 0.0 - 0.5 K/uL   Basophils Relative 1 %    Basophils Absolute 0.1 0.0 - 0.1 K/uL   Immature Granulocytes 0 %   Abs Immature Granulocytes 0.04 0.00 - 0.07 K/uL    Comment: Performed at [facility], [place] [address] [place], [place] [address]  Comprehensive metabolic panel     Status: Abnormal   Collection Time: 05/30/19  1:03 AM  Result Value Ref Range   Sodium 140 135 - 145 mmol/L   Potassium 3.8 3.5 - 5.1 mmol/L   Chloride 109 98 - 111 mmol/L   CO2 22 22 - 32 mmol/L   Glucose, Bld 103 (H) 70 - 99 mg/dL   BUN 9 6 - 20 mg/dL   Creatinine, Ser 0.89 0.44 - 1.00 mg/dL   Calcium 9.3 8.9 - 10.3 mg/dL   Total Protein 7.7 6.5 - 8.1 g/dL   Albumin 3.6 3.5 - 5.0 g/dL   AST 135 (H) 15 - 41 U/L   ALT 92 (H) 0 - 44 U/L   Alkaline Phosphatase 129 (H) 38 - 126 U/L   Total Bilirubin 0.4 0.3 - 1.2 mg/dL   GFR calc non Af Amer >60 >60 mL/min   GFR calc Af Amer >60 >60 mL/min   Anion gap 9 5 - 15    Comment: Performed at [facility], [place] [address], [place], [place] [address]  Lipase, blood     Status: None   Collection Time: 05/30/19  1:03 AM  Result Value Ref Range   Lipase 45 11 - 51 U/L    Comment: Performed at [facility], [place] [address], [place], [place] [address]  hCG, quantitative, pregnancy     Status: None   Collection Time: 05/30/19  1:03 AM  Result Value Ref Range   hCG, Beta Chain, Quant, S <1 <5 mIU/mL    Comment:          GEST. AGE      CONC.  (mIU/mL)   <=1 WEEK        5 - 50     2 WEEKS       50 - 500     3 WEEKS       100 - 10,000     4 WEEKS     1,000 - 30,000     5 WEEKS     3,500 - 115,000   6-8 WEEKS     12,000 - 270,000    12 WEEKS     15,000 - 220,000        FEMALE AND NON-PREGNANT FEMALE:     LESS THAN 5 mIU/mL Performed at [facility], [place]  [address] [place], [place] [address]     DG Chest 2 View  Result Date: 05/30/2019 CLINICAL DATA:  Pressure at site of prior gallbladder drain EXAM: CHEST - 2 VIEW COMPARISON:  None.  FINDINGS: Low lung volumes. No significant pleural effusion. No consolidation. Normal heart size. No pneumothorax. Drainage catheter in the right upper quadrant. IMPRESSION: No active cardiopulmonary disease.  Low lung volumes. Electronically Signed   By: Donavan Foil M.D.   On: 05/30/2019 01:09   CT ABDOMEN PELVIS W CONTRAST  Result Date: 05/30/2019 CLINICAL DATA:  Abdominal abscess/infection suspected Patient reports drain placed in gallbladder 2 weeks ago at an outside institution. Pain at site of drainage catheter. EXAM: CT ABDOMEN AND PELVIS WITH CONTRAST TECHNIQUE: Multidetector CT imaging of the abdomen and pelvis was performed using the standard protocol following bolus administration of intravenous contrast. Listed history of IVP dye allergy, patient reports allergy is to MRI contrast. No reported contrast reaction. CONTRAST:  169m OMNIPAQUE IOHEXOL 300 MG/ML  SOLN COMPARISON:  Right upper quadrant ultrasound earlier this day. Abdominal CT 09/07/2017. FINDINGS: Lower chest: Linear atelectasis in both lower lobes. No pleural fluid. Hepatobiliary: Percutaneous biliary catheter extends through the right lobe of the liver into the common bile duct, tip in the duodenum. There is no intra or extrahepatic biliary ductal dilatation. No evidence of stones along the biliary catheter or in the common bile duct. No abnormality along the subcutaneous course of the catheter, not entirely included in the field of view. There is an additional catheter within the duodenum. Gallstones within partially distended gallbladder. Gallbladder wall thickening versus pericholecystic fluid, fluid demonstrated on recent ultrasound. There is a 3.3 x 2.2 x 4.0 cm low-density lesion in the posterior right lobe of the liver, series 2, image 18. Single focus of air in the right biliary tree. No discrete perihepatic fluid collection. Pancreas: No evidence of peripancreatic inflammation. No pancreatic ductal dilatation. Spleen: Normal  in size without focal abnormality. Adrenals/Urinary Tract: Normal adrenal glands. No hydronephrosis or perinephric edema. Homogeneous renal enhancement. Urinary bladder is physiologically distended without wall thickening. Stomach/Bowel: Stomach is within normal limits. Appendix appears normal. No evidence of bowel wall thickening, distention, or inflammatory changes. Question of duodenal diverticulum adjacent to the fourth portion. Moderate colonic stool burden. Vascular/Lymphatic: Abdominal aorta is normal in caliber. The portal vein is patent. No significant adenopathy. Reproductive: IUD in the uterus, possibly low in positioning. No adnexal mass. Other: No ascites or free fluid. No free air in the abdomen. No evidence of intra-abdominal abscess. Small fat containing umbilical hernia. Musculoskeletal: There are no acute or suspicious osseous abnormalities. IMPRESSION: 1. Percutaneous biliary catheter extending through the right lobe of the liver into the common bile duct, tip in the duodenum. No evidence of stones along the biliary catheter or in the common bile duct. No biliary dilatation. There is an additional thin catheter in the duodenum. 2. Partially distended gallbladder with gallstones. Gallbladder wall thickening versus pericholecystic fluid, fluid was seen on recent ultrasound. 3. Low-density lesion in the posterior right lobe of the liver measuring 3.3 x 2.2 x 4.0 cm. This is new from 2019 CT, differential considerations include hepatic lesion versus abscess. Consider further evaluation with MRI, this is not seen on ultrasound. 4. Moderate colonic stool burden, can be seen with constipation. Electronically Signed   By: MKeith RakeM.D.   On: 05/30/2019 05:58   MR LIVER W WO CONTRAST  Result Date: 05/30/2019 CLINICAL DATA:  Abdominal abscess/infection suspected, history of biliary drain and  history of cholecystitis. EXAM: MRI ABDOMEN WITHOUT AND WITH CONTRAST TECHNIQUE: Multiplanar  multisequence MR imaging of the abdomen was performed both before and after the administration of intravenous contrast. CONTRAST:  69m GADAVIST GADOBUTROL 1 MMOL/ML IV SOLN COMPARISON:  CT 05/30/2019 FINDINGS: Lower chest: Incidental imaging of the lung bases is is unremarkable by MRI, limited assessment. Is Hepatobiliary: Signs of hepatic steatosis. Transhepatic percutaneous biliary drainage catheter entering via right-sided approach. Suggestion of debris and or small stones in the distal common bile duct about the catheter though small locules of air could potentially have a similar appearance. Study was not performed as an MRCP and there is evidence of pneumobilia on the current study. Is In the posterior right hemi liver, hepatic subsegment VII is a ovoid 2.9 x 1.9 fluid collection with peripheral enhancement and surrounding edema. Is There is a small pocket of fluid tracking along the interface between the gallbladder and the liver. This measures approximately 1 cm in greatest thickness and extends along the mid gallbladder to the gallbladder fundus showing mixed signal. There is distortion of the gallbladder wall and thickening with hyperenhancement of the gallbladder wall. Sludge and at least 1 stone present in the gallbladder lumen. Small stones layer in the cystic duct as well. Pancreas: Pancreas is normal except for an area adjacent to the head of the pancreas and duodenum that measures approximately 1.8 x 1.4 cm best seen on image 68 of postcontrast images at the root of the small bowel mesentery also seen on previous CT. The pancreatic stent which is suggested on the prior imaging evaluation is not as well seen on the current evaluation. Stranding is noted about the pancreatic head. Spleen:  Spleen is normal. Adrenals/Urinary Tract:  Adrenal glands are normal. The kidneys with smooth contours and no signs of hydronephrosis. Stomach/Bowel: Limited assessment of bowel on MRI without signs of acute  gastrointestinal process. Vascular/Lymphatic: Vascular structures in the abdomen are patent including the portal vein. No signs of adenopathy. Mildly enlarged lymph nodes near the porta hepatis in the setting of a active inflammation. Other:  No ascites. Musculoskeletal: No acute bone process. IMPRESSION: 1. Transhepatic percutaneous biliary drainage catheter entering via right-sided approach. Pneumobilia also with suggestion of debris and/or small stones along the course of the catheter in the distal common bile duct. MRCP was not performed. 2. Small abscess or biloma in the right posterior hemi liver. Follow-up to ensure resolution. 3. Signs of cholecystitis with potential rupture of the gallbladder into the gallbladder fossa with small adjacent fluid collection. This shows mixed signal intensity with some suggestion of blood. Correlate with any attempted percutaneous gallbladder access. 4. Signs of pancreatitis with presumed fat necrosis or small phlegmon near the pancreatic head, attention on follow-up. 5. Signs of pancreatic stent not as well seen as on previous CT study. 6. Hepatic steatosis. 7. Mildly enlarged lymph nodes near the porta hepatis in the setting of active inflammation, likely reactive. Electronically Signed   By: GZetta BillsM.D.   On: 05/30/2019 13:41   UKoreaAbdomen Limited RUQ  Result Date: 05/30/2019 CLINICAL DATA:  Right upper quadrant pain EXAM: ULTRASOUND ABDOMEN LIMITED RIGHT UPPER QUADRANT COMPARISON:  CT 09/07/2017 FINDINGS: Gallbladder: Small stones measuring up to 4 mm. Negative sonographic Murphy. Increased wall thickness up to 4.7 mm. Positive for pericholecystic fluid. Common bile duct: Diameter: 7.2 mm. Partially visualized drainage catheter within the common bile duct. Common bile duct wall appears slightly thickened. Liver: No focal lesion identified. Within normal limits in parenchymal  echogenicity. Portal vein is patent on color Doppler imaging with normal direction of  blood flow towards the liver. Other: None. IMPRESSION: 1. Small gallstones. Increased wall thickness with small amount of pericholecystic fluid but negative sonographic Percell Miller, findings could be secondary to acute or ongoing cholecystitis. 2. No drainage catheter visible sonography at the gallbladder fossa. A portion of a drainage catheter is visible within the common bile duct. Mild wall thickening of the common bile duct possibly due to inflammation or infection. Electronically Signed   By: Donavan Foil M.D.   On: 05/30/2019 01:13    Review of Systems  Gastrointestinal: Positive for abdominal pain.  All other systems reviewed and are negative.  Blood pressure 128/88, pulse 77, temperature 98.6 F (37 C), temperature source Oral, resp. rate 16, SpO2 99 %, unknown if currently breastfeeding. Physical Exam  Vitals reviewed. Constitutional: She is oriented to person, place, and time. She appears well-developed and well-nourished.  HENT:  Head: Normocephalic and atraumatic.  Eyes: No scleral icterus.  Cardiovascular: Normal rate, regular rhythm and normal heart sounds.  Respiratory: Effort normal and breath sounds normal.  GI: Soft. There is abdominal tenderness (epigastrium and around catheter entry site).  Musculoskeletal:     Cervical back: Neck supple.  Neurological: She is alert and oriented to person, place, and time.  Skin: Skin is warm and dry.    Assessment/Plan: Likely choledocholithiasis with biliary drain Possible cholecystitis ? Liver abscess -no current indication for surgery -discussed with IR who will attempt to drain liver abscess/dx and inject drain tomorrow -will ask gi to see also as she is going to stay here for care.  At some point needs ercp to clear duct and to get perc tube out, this likely will facilitate the ercp -eventually needs cholecystectomy once this is all resolved in 4-8 weeks  Rolm Bookbinder 05/30/2019, 3:54 PM

## 2019-05-30 NOTE — ED Notes (Signed)
Per MRI: Pt next for MRI

## 2019-05-30 NOTE — Progress Notes (Signed)
Pharmacy Antibiotic Note  Stacy Mills is a 23 y.o. female admitted on 05/30/2019 with intra-abd abscess.  Pharmacy has been consulted for Zosyn dosing. Hx of percutaneous biliary drain placement 2wk ago, per CT & MRI: cholecystitis w/stones, liver lesion - abscess or biloma, pancreatitis  Plan: Zosyn 3.375g IV q8h (4 hour infusion).    Temp (24hrs), Avg:98.6 F (37 C), Min:98.6 F (37 C), Max:98.6 F (37 C)  Recent Labs  Lab 05/30/19 0103  WBC 11.1*  CREATININE 0.89    CrCl cannot be calculated (Unknown ideal weight.).    Allergies  Allergen Reactions  . Ivp Dye [Iodinated Diagnostic Agents] Itching and Nausea Only    Contrast for MRI    Antimicrobials this admission: 12/31 Zosyn >>   Dose adjustments this admission:  Microbiology results: 12/31 Covid: pending  Thank you for allowing pharmacy to be a part of this patient's care.  Minda Ditto PharmD 05/30/2019 4:17 PM

## 2019-05-30 NOTE — ED Notes (Signed)
Pt transported to CT ?

## 2019-05-30 NOTE — ED Notes (Signed)
Pt to get benadryl prior to MRI

## 2019-05-30 NOTE — ED Notes (Signed)
Pt transported to MRI 

## 2019-05-30 NOTE — ED Provider Notes (Signed)
1440: Discussed clinical scenario with the general surgeon on-call Dr. Donne Hazel.  He will consult on patient.  Probable discussion with interventional radiology.  Will admit to general medicine.  CRITICAL CARE Performed by: Nat Christen Total critical care time: 30 minutes Critical care time was exclusive of separately billable procedures and treating other patients. Critical care was necessary to treat or prevent imminent or life-threatening deterioration. Critical care was time spent personally by me on the following activities: development of treatment plan with patient and/or surrogate as well as nursing, discussions with consultants, evaluation of patient's response to treatment, examination of patient, obtaining history from patient or surrogate, ordering and performing treatments and interventions, ordering and review of laboratory studies, ordering and review of radiographic studies, pulse oximetry and re-evaluation of patient's condition.   Nat Christen, MD 05/30/19 [phone]

## 2019-05-30 NOTE — ED Notes (Signed)
Pt updated on plan of care. Pt resting in bed at this time. NAD noted.

## 2019-05-30 NOTE — Consult Note (Addendum)
Chief Complaint: Patient was seen in consultation today for hepatic fluid collection/aspiration and drainage.  Referring Physician(s): CCS  Supervising Physician: Richarda Overlie  Patient Status: [facility] - ED  History of Present Illness: Stacy Mills is a 23 y.o. female with a past medical history of migraines, idiopathic intracranial hypertension, PID, HSV, anxiety, and depression. Of note, patient is from [facility] [facility] and is currently in the process of moving to [place]. Per patient- earlier this month while she was moving things down to [place], she developed abdominal pain and went for further evaluation. She was found to have gallstones in her gallbladder and "ducts". ERCP was attempted but unable to get to stones per patient. Because of this, patient had placement of internal/external biliary drains. She was sent home with drains with plans to return for ERCP and drain removal in [place] 06/04/2019. However, while in [place] getting more things for the move, patient developed abdominal pain and she presented to [facility] ED for further evaluation. In ED, CT abdomen/pelvis and MR liver revealed a hepatic fluid collection suspicious of an abscess. She was admitted for further management. CCS was consulted who recommends IR consultation for possible image-guided hepatic fluid collection aspiration/drain placement.  CT abdomen/pelvis 05/30/2019: 1. Percutaneous biliary catheter extending through the right lobe of the liver into the common bile duct, tip in the duodenum. No evidence of stones along the biliary catheter or in the common bile duct. No biliary dilatation. There is an additional thin catheter in the duodenum. 2. Partially distended gallbladder with gallstones. Gallbladder wall thickening versus pericholecystic fluid, fluid was seen on recent ultrasound. 3. Low-density lesion in the posterior right lobe of the liver measuring 3.3 x 2.2 x 4.0 cm. This is new from 2019 CT, differential considerations include hepatic lesion  versus abscess. Consider further evaluation with MRI, this is not seen on ultrasound. 4. Moderate colonic stool burden, can be seen with constipation.  MR liver 05/30/2019: 1. Transhepatic percutaneous biliary drainage catheter entering via right-sided approach. Pneumobilia also with suggestion of debris and/or small stones along the course of the catheter in the distal common bile duct. MRCP was not performed. 2. Small abscess or biloma in the right posterior hemi liver. Follow-up to ensure resolution. 3. Signs of cholecystitis with potential rupture of the gallbladder into the gallbladder fossa with small adjacent fluid collection. This shows mixed signal intensity with some suggestion of blood. Correlate with any attempted percutaneous gallbladder access. 4. Signs of pancreatitis with presumed fat necrosis or small phlegmon near the pancreatic head, attention on follow-up. 5. Signs of pancreatic stent not as well seen as on previous CT study. 6. Hepatic steatosis. 7. Mildly enlarged lymph nodes near the porta hepatis in the setting of active inflammation, likely reactive.  IR consulted by Dr. Dwain Sarna for possible image-guided hepatic fluid collection aspiration/drain placement. Patient awake and alert laying in bed. Complains of RUQ pain, rated 1-2/10. Denies fever, chills, chest pain, dyspnea, or headache.   Past Medical History:  Diagnosis Date  . Allergy   . Anxiety   . Depression   . HSV (herpes simplex virus) anogenital infection   . IIH (idiopathic intracranial hypertension)   . IUD (intrauterine device) in place    Paraguard placed 2014, removed 10/2015  . Migraine   . Pelvic inflammatory disease 10/2012    Past Surgical History:  Procedure Laterality Date  . CESAREAN SECTION N/A 08/19/2018   Procedure: CESAREAN SECTION;  Surgeon: Jaymes Graff, MD;  Location: [facility] LD ORS;  Service: Obstetrics;  Laterality: N/A;  . spinal tap      Allergies: Ivp dye [iodinated  diagnostic agents]  Medications: Prior to Admission medications   Medication Sig Start Date End Date Taking? Authorizing Provider  acetaminophen (TYLENOL) 325 MG tablet Take 650 mg by mouth every 6 (six) hours as needed for mild pain or moderate pain.   Yes [provider]  baclofen (LIORESAL) 10 MG tablet Take 10 mg by mouth 2 (two) times daily as needed for pain. 05/20/19  Yes [provider]  Crisaborole (EUCRISA) 2 % OINT Apply 1 application topically daily as needed (eczema).    Yes [provider]  levonorgestrel (MIRENA) 20 MCG/24HR IUD 1 each by Intrauterine route once.   Yes [provider]  oxyCODONE-acetaminophen (PERCOCET/ROXICET) 5-325 MG tablet Take 1-2 tablets by mouth every 4 (four) hours as needed for pain. 05/20/19  Yes [provider]  docusate sodium (COLACE) 100 MG capsule Take 100 mg by mouth 2 (two) times daily as needed for constipation. 05/20/19   [provider]  ibuprofen (ADVIL,MOTRIN) 600 MG tablet Take 1 tablet (600 mg total) by mouth every 6 (six) hours as needed for moderate pain. Patient not taking: Reported on 05/30/2019 08/22/18   Janeece Riggers, CNM     Family History  Problem Relation Age of Onset  . Emphysema Maternal Grandfather   . Depression Maternal Grandfather   . Anxiety disorder Maternal Grandfather   . Mental illness Maternal Grandfather   . Diabetes Maternal Grandfather   . Cancer Paternal Grandmother   . Non-Hodgkin's lymphoma Paternal Grandmother   . Sudden death Paternal Grandmother   . Asthma Mother   . Hypertension Father   . Alcohol abuse Maternal Aunt   . Hyperlipidemia Maternal Aunt     Social History   Socioeconomic History  . Marital status: Single    Spouse name: n/a  . Number of children: 0  . Years of education: 12+  . Highest education level: Not on file  Occupational History  . Occupation: Archivist  Tobacco Use  . Smoking status: Former Games developer  .  Smokeless tobacco: Never Used  Substance and Sexual Activity  . Alcohol use: Not Currently    Alcohol/week: 2.0 standard drinks    Types: 2 Shots of liquor per week    Comment: social  . Drug use: Not Currently    Types: Other-see comments, Marijuana  . Sexual activity: Yes    Partners: Male    Birth control/protection: Condom    Comment: intercourse age 68, more than 5 sexual partners  Other Topics Concern  . Not on file  Social History Narrative   Graduated from high school 10/2012, currently attending college.   Right-handed   Caffeine: 3x/wk   Social Determinants of Health   Financial Resource Strain: Low Risk   . Difficulty of Paying Living Expenses: Not hard at all  Food Insecurity: No Food Insecurity  . Worried About Programme researcher, broadcasting/film/video in the Last Year: Never true  . Ran Out of Food in the Last Year: Never true  Transportation Needs: Unknown  . Lack of Transportation (Medical): No  . Lack of Transportation (Non-Medical): Not on file  Physical Activity:   . Days of Exercise per Week: Not on file  . Minutes of Exercise per Session: Not on file  Stress: No Stress Concern Present  . Feeling of Stress : Not at all  Social Connections:   . Frequency of Communication with Friends and Family: Not  on file  . Frequency of Social Gatherings with Friends and Family: Not on file  . Attends Religious Services: Not on file  . Active Member of Clubs or Organizations: Not on file  . Attends Banker Meetings: Not on file  . Marital Status: Not on file     Review of Systems: A 12 point ROS discussed and pertinent positives are indicated in the HPI above.  All other systems are negative.  Review of Systems  Constitutional: Negative for chills and fever.  Respiratory: Negative for shortness of breath and wheezing.   Cardiovascular: Negative for chest pain and palpitations.  Gastrointestinal: Positive for abdominal pain.  Neurological: Negative for headaches.    Psychiatric/Behavioral: Negative for behavioral problems and confusion.    Vital Signs: BP 128/88   Pulse 77   Temp 98.6 F (37 C) (Oral)   Resp 16   SpO2 99%   Physical Exam Vitals and nursing note reviewed.  Constitutional:      General: She is not in acute distress.    Appearance: Normal appearance.  Cardiovascular:     Rate and Rhythm: Normal rate and regular rhythm.     Heart sounds: Normal heart sounds. No murmur.  Pulmonary:     Effort: Pulmonary effort is normal. No respiratory distress.     Breath sounds: Normal breath sounds. No wheezing.  Abdominal:     Palpations: Abdomen is soft.     Comments: RUQ tenderness. Internal/external biliary drain site without tenderness, erythema, drainage, or active bleeding; approximately 100 cc yellow bile in gravity bag.   Skin:    General: Skin is warm and dry.  Neurological:     Mental Status: She is alert and oriented to person, place, and time.  Psychiatric:        Mood and Affect: Mood normal.        Behavior: Behavior normal.      MD Evaluation Airway: WNL Heart: WNL Abdomen: WNL Chest/ Lungs: WNL ASA  Classification: 2 Mallampati/Airway Score: Two   Imaging: DG Chest 2 View  Result Date: 05/30/2019 CLINICAL DATA:  Pressure at site of prior gallbladder drain EXAM: CHEST - 2 VIEW COMPARISON:  None. FINDINGS: Low lung volumes. No significant pleural effusion. No consolidation. Normal heart size. No pneumothorax. Drainage catheter in the right upper quadrant. IMPRESSION: No active cardiopulmonary disease.  Low lung volumes. Electronically Signed   By: Jasmine Pang M.D.   On: 05/30/2019 01:09   CT ABDOMEN PELVIS W CONTRAST  Result Date: 05/30/2019 CLINICAL DATA:  Abdominal abscess/infection suspected Patient reports drain placed in gallbladder 2 weeks ago at an outside institution. Pain at site of drainage catheter. EXAM: CT ABDOMEN AND PELVIS WITH CONTRAST TECHNIQUE: Multidetector CT imaging of the abdomen and  pelvis was performed using the standard protocol following bolus administration of intravenous contrast. Listed history of IVP dye allergy, patient reports allergy is to MRI contrast. No reported contrast reaction. CONTRAST:  OMNIPAQUE IOHEXOL 300 MG/ML  SOLN COMPARISON:  Right upper quadrant ultrasound earlier this day. Abdominal CT 09/07/2017. FINDINGS: Lower chest: Linear atelectasis in both lower lobes. No pleural fluid. Hepatobiliary: Percutaneous biliary catheter extends through the right lobe of the liver into the common bile duct, tip in the duodenum. There is no intra or extrahepatic biliary ductal dilatation. No evidence of stones along the biliary catheter or in the common bile duct. No abnormality along the subcutaneous course of the catheter, not entirely included in the field of view. There is an  additional catheter within the duodenum. Gallstones within partially distended gallbladder. Gallbladder wall thickening versus pericholecystic fluid, fluid demonstrated on recent ultrasound. There is a 3.3 x 2.2 x 4.0 cm low-density lesion in the posterior right lobe of the liver, series 2, image 18. Single focus of air in the right biliary tree. No discrete perihepatic fluid collection. Pancreas: No evidence of peripancreatic inflammation. No pancreatic ductal dilatation. Spleen: Normal in size without focal abnormality. Adrenals/Urinary Tract: Normal adrenal glands. No hydronephrosis or perinephric edema. Homogeneous renal enhancement. Urinary bladder is physiologically distended without wall thickening. Stomach/Bowel: Stomach is within normal limits. Appendix appears normal. No evidence of bowel wall thickening, distention, or inflammatory changes. Question of duodenal diverticulum adjacent to the fourth portion. Moderate colonic stool burden. Vascular/Lymphatic: Abdominal aorta is normal in caliber. The portal vein is patent. No significant adenopathy. Reproductive: IUD in the uterus, possibly low in  positioning. No adnexal mass. Other: No ascites or free fluid. No free air in the abdomen. No evidence of intra-abdominal abscess. Small fat containing umbilical hernia. Musculoskeletal: There are no acute or suspicious osseous abnormalities. IMPRESSION: 1. Percutaneous biliary catheter extending through the right lobe of the liver into the common bile duct, tip in the duodenum. No evidence of stones along the biliary catheter or in the common bile duct. No biliary dilatation. There is an additional thin catheter in the duodenum. 2. Partially distended gallbladder with gallstones. Gallbladder wall thickening versus pericholecystic fluid, fluid was seen on recent ultrasound. 3. Low-density lesion in the posterior right lobe of the liver measuring 3.3 x 2.2 x 4.0 cm. This is new from 2019 CT, differential considerations include hepatic lesion versus abscess. Consider further evaluation with MRI, this is not seen on ultrasound. 4. Moderate colonic stool burden, can be seen with constipation. Electronically Signed   By: Narda RutherfordMelanie  Sanford M.D.   On: 05/30/2019 05:58   MR LIVER W WO CONTRAST  Result Date: 05/30/2019 CLINICAL DATA:  Abdominal abscess/infection suspected, history of biliary drain and history of cholecystitis. EXAM: MRI ABDOMEN WITHOUT AND WITH CONTRAST TECHNIQUE: Multiplanar multisequence MR imaging of the abdomen was performed both before and after the administration of intravenous contrast. CONTRAST:  10mL GADAVIST GADOBUTROL 1 MMOL/ML IV SOLN COMPARISON:  CT 05/30/2019 FINDINGS: Lower chest: Incidental imaging of the lung bases is is unremarkable by MRI, limited assessment. Is Hepatobiliary: Signs of hepatic steatosis. Transhepatic percutaneous biliary drainage catheter entering via right-sided approach. Suggestion of debris and or small stones in the distal common bile duct about the catheter though small locules of air could potentially have a similar appearance. Study was not performed as an MRCP  and there is evidence of pneumobilia on the current study. Is In the posterior right hemi liver, hepatic subsegment VII is a ovoid 2.9 x 1.9 fluid collection with peripheral enhancement and surrounding edema. Is There is a small pocket of fluid tracking along the interface between the gallbladder and the liver. This measures approximately 1 cm in greatest thickness and extends along the mid gallbladder to the gallbladder fundus showing mixed signal. There is distortion of the gallbladder wall and thickening with hyperenhancement of the gallbladder wall. Sludge and at least 1 stone present in the gallbladder lumen. Small stones layer in the cystic duct as well. Pancreas: Pancreas is normal except for an area adjacent to the head of the pancreas and duodenum that measures approximately 1.8 x 1.4 cm best seen on image 68 of postcontrast images at the root of the small bowel mesentery also seen on  previous CT. The pancreatic stent which is suggested on the prior imaging evaluation is not as well seen on the current evaluation. Stranding is noted about the pancreatic head. Spleen:  Spleen is normal. Adrenals/Urinary Tract:  Adrenal glands are normal. The kidneys with smooth contours and no signs of hydronephrosis. Stomach/Bowel: Limited assessment of bowel on MRI without signs of acute gastrointestinal process. Vascular/Lymphatic: Vascular structures in the abdomen are patent including the portal vein. No signs of adenopathy. Mildly enlarged lymph nodes near the porta hepatis in the setting of a active inflammation. Other:  No ascites. Musculoskeletal: No acute bone process. IMPRESSION: 1. Transhepatic percutaneous biliary drainage catheter entering via right-sided approach. Pneumobilia also with suggestion of debris and/or small stones along the course of the catheter in the distal common bile duct. MRCP was not performed. 2. Small abscess or biloma in the right posterior hemi liver. Follow-up to ensure resolution. 3.  Signs of cholecystitis with potential rupture of the gallbladder into the gallbladder fossa with small adjacent fluid collection. This shows mixed signal intensity with some suggestion of blood. Correlate with any attempted percutaneous gallbladder access. 4. Signs of pancreatitis with presumed fat necrosis or small phlegmon near the pancreatic head, attention on follow-up. 5. Signs of pancreatic stent not as well seen as on previous CT study. 6. Hepatic steatosis. 7. Mildly enlarged lymph nodes near the porta hepatis in the setting of active inflammation, likely reactive. Electronically Signed   By: Zetta Bills M.D.   On: 05/30/2019 13:41   US Abdomen Limited RUQ  Result Date: 05/30/2019 CLINICAL DATA:  Right upper quadrant pain EXAM: ULTRASOUND ABDOMEN LIMITED RIGHT UPPER QUADRANT COMPARISON:  CT 09/07/2017 FINDINGS: Gallbladder: Small stones measuring up to 4 mm. Negative sonographic Murphy. Increased wall thickness up to 4.7 mm. Positive for pericholecystic fluid. Common bile duct: Diameter: 7.2 mm. Partially visualized drainage catheter within the common bile duct. Common bile duct wall appears slightly thickened. Liver: No focal lesion identified. Within normal limits in parenchymal echogenicity. Portal vein is patent on color Doppler imaging with normal direction of blood flow towards the liver. Other: None. IMPRESSION: 1. Small gallstones. Increased wall thickness with small amount of pericholecystic fluid but negative sonographic Percell Miller, findings could be secondary to acute or ongoing cholecystitis. 2. No drainage catheter visible sonography at the gallbladder fossa. A portion of a drainage catheter is visible within the common bile duct. Mild wall thickening of the common bile duct possibly due to inflammation or infection. Electronically Signed   By: Donavan Foil M.D.   On: 05/30/2019 01:13    Labs:  CBC: Recent Labs    08/14/18 1343 08/19/18 0108 08/20/18 0642 05/30/19 0103  WBC  14.7* 16.0* 23.5* 11.1*  HGB 11.2* 10.7* 10.5* 12.9  HCT 35.0* 34.5* 34.3* 41.0  PLT 319 303 292 552*    COAGS: No results for input(s): INR, APTT in the last 8760 hours.  BMP: Recent Labs    08/14/18 1343 08/20/18 0642 05/30/19 0103  NA 137  --  140  K 3.9  --  3.8  CL 108  --  109  CO2 21*  --  22  GLUCOSE 99  --  103*  BUN 8  --  9  CALCIUM 9.7  --  9.3  CREATININE 0.82 0.74 0.89  GFRNONAA >60 >60 >60  GFRAA >60 >60 >60    LIVER FUNCTION TESTS: Recent Labs    08/14/18 1343 05/30/19 0103  BILITOT 0.4 0.4  AST 17 135*  ALT 9  92*  ALKPHOS 185* 129*  PROT 6.7 7.7  ALBUMIN 2.7* 3.6     Assessment and Plan:  Hepatic fluid collection. Plan for image-guided hepatic fluid collection aspiration with possible drain placement. Patient will be NPO at midnight. Afebrile. She does not take blood thinners. INR pending.  Risks and benefits discussed with the patient including bleeding, infection, damage to adjacent structures, bowel perforation/fistula connection, and sepsis. All of the patient's questions were answered, patient is agreeable to proceed. Consent signed and in chart.   Thank you for this interesting consult.  I greatly enjoyed meeting Stacy Mills and look forward to participating in their care.  A copy of this report was sent to the requesting provider on this date.  Electronically Signed: Elwin Mocha, PA-C 05/30/2019, 3:51 PM   I spent a total of 40 Minutes in face to face in clinical consultation, greater than 50% of which was counseling/coordinating care for hepatic fluid collection/aspiration and drainage.

## 2019-05-30 NOTE — ED Provider Notes (Signed)
Emergency Department Provider Note   I have reviewed the triage vital signs and the nursing notes.   HISTORY  Chief Complaint Post-op Problem   HPI Stacy Mills is a 23 y.o. female who presents the emergency department today with some abdominal pain.  Patient states that she had some problem with her gallbladder in [place] and had a drain placed and is doing his surgery in about 5 days however over last 2 to 3 days she is a progressively worsening pain around the site of the catheter.  She states that is worse when she takes deep breath when she moves certain ways.  She had no fevers, nausea, vomiting associated with this.  She states that she was pain-free prior to this.  She still has pain medication but did not seem to be helping as they were previously.  She states that she the drainage out of her catheter seems to be fine.  She states that her mother is cleaning the catheter for her.  They have not noticed any evidence of infection.  States that it feels like the pain is right below the skin and not really in her gallbladder like it was before.   Dr. Eulah Pont who is her gastroenterologist in [place] his phone number is [phone].   No other associated or modifying symptoms.    Past Medical History:  Diagnosis Date  . Allergy   . Anxiety   . Depression   . HSV (herpes simplex virus) anogenital infection   . IIH (idiopathic intracranial hypertension)   . IUD (intrauterine device) in place    Paraguard placed 2014, removed 10/2015  . Migraine   . Pelvic inflammatory disease 10/2012    Patient Active Problem List   Diagnosis Date Noted  . Obesity complicating pregnancy in third trimester 08/19/2018  . Expectant parent prebirth pediatrician visit 06/12/2018  . Gonorrhea affecting pregnancy 02/05/2018  . Idiopathic intracranial hypertension   . Migraines 12/22/2014  . PID (acute pelvic inflammatory disease) 11/07/2012  . Hemorrhagic cyst of ovary 11/07/2012  . Severe  obesity (BMI >= 40) (HCC) 08/26/2012    Past Surgical History:  Procedure Laterality Date  . CESAREAN SECTION N/A 08/19/2018   Procedure: CESAREAN SECTION;  Surgeon: Jaymes Graff, MD;  Location: [facility];  Service: Obstetrics;  Laterality: N/A;  . spinal tap      Current Outpatient Rx  . Order #: [id number] Class: Historical Med  . Order #: [id number] Class: Normal  . Order #: [id number] Class: Normal  . Order #: [id number] Class: Historical Med  . Order #: [id number] Class: Historical Med    Allergies Ivp dye [iodinated diagnostic agents]  Family History  Problem Relation Age of Onset  . Emphysema Maternal Grandfather   . Depression Maternal Grandfather   . Anxiety disorder Maternal Grandfather   . Mental illness Maternal Grandfather   . Diabetes Maternal Grandfather   . Cancer Paternal Grandmother   . Non-Hodgkin's lymphoma Paternal Grandmother   . Sudden death Paternal Grandmother   . Asthma Mother   . Hypertension Father   . Alcohol abuse Maternal Aunt   . Hyperlipidemia Maternal Aunt     Social History Social History   Tobacco Use  . Smoking status: Former Games developer  . Smokeless tobacco: Never Used  Substance Use Topics  . Alcohol use: Not Currently    Alcohol/week: 2.0 standard drinks    Types: 2 Shots of liquor per week    Comment: social  . Drug use: Not Currently    Types: [place]  comments, Marijuana    Review of Systems  All other systems negative except as documented in the HPI. All pertinent positives and negatives as reviewed in the HPI. ____________________________________________   PHYSICAL EXAM:  VITAL SIGNS: ED Triage Vitals [05/29/19 2257]  Enc Vitals Group     BP (!) 163/98     Pulse Rate (!) 114     Resp 18     Temp 98.6 F (37 C)     Temp Source Oral     SpO2 100 %    Constitutional: Alert and oriented. Well appearing and in no acute distress. Eyes: Conjunctivae are normal. PERRL. EOMI. Head: Atraumatic. Nose: No  congestion/rhinnorhea. Mouth/Throat: Mucous membranes are moist.  Oropharynx non-erythematous. Neck: No stridor.  No meningeal signs.   Cardiovascular: Normal rate, regular rhythm. Good peripheral circulation. Grossly normal heart sounds.   Respiratory: Normal respiratory effort.  No retractions. Lungs CTAB. Gastrointestinal: Soft and nontender. No distention.  Musculoskeletal: No lower extremity tenderness nor edema. No gross deformities of extremities. Neurologic:  Normal speech and language. No gross focal neurologic deficits are appreciated.  Skin:  Catheter with one suture in place, moves freely, no erythema, warmth, induration or fluctuance around it. Has one pinpoint area of tenderness.   ____________________________________________   LABS (all labs ordered are listed, but only abnormal results are displayed)  Labs Reviewed  CBC WITH DIFFERENTIAL/PLATELET - Abnormal; Notable for the following components:      Result Value   WBC 11.1 (*)    Platelets 552 (*)    Lymphs Abs 4.1 (*)    All other components within normal limits  COMPREHENSIVE METABOLIC PANEL - Abnormal; Notable for the following components:   Glucose, Bld 103 (*)    AST 135 (*)    ALT 92 (*)    Alkaline Phosphatase 129 (*)    All other components within normal limits  LIPASE, BLOOD  HCG, QUANTITATIVE, PREGNANCY   ____________________________________________  RADIOLOGY  DG Chest 2 View  Result Date: 05/30/2019 CLINICAL DATA:  Pressure at site of prior gallbladder drain EXAM: CHEST - 2 VIEW COMPARISON:  None. FINDINGS: Low lung volumes. No significant pleural effusion. No consolidation. Normal heart size. No pneumothorax. Drainage catheter in the right upper quadrant. IMPRESSION: No active cardiopulmonary disease.  Low lung volumes. Electronically Signed   By: Donavan Foil M.D.   On: 05/30/2019 01:09   US Abdomen Limited RUQ  Result Date: 05/30/2019 CLINICAL DATA:  Right upper quadrant pain EXAM:  ULTRASOUND ABDOMEN LIMITED RIGHT UPPER QUADRANT COMPARISON:  CT 09/07/2017 FINDINGS: Gallbladder: Small stones measuring up to 4 mm. Negative sonographic Murphy. Increased wall thickness up to 4.7 mm. Positive for pericholecystic fluid. Common bile duct: Diameter: 7.2 mm. Partially visualized drainage catheter within the common bile duct. Common bile duct wall appears slightly thickened. Liver: No focal lesion identified. Within normal limits in parenchymal echogenicity. Portal vein is patent on color Doppler imaging with normal direction of blood flow towards the liver. Other: None. IMPRESSION: 1. Small gallstones. Increased wall thickness with small amount of pericholecystic fluid but negative sonographic Percell Miller, findings could be secondary to acute or ongoing cholecystitis. 2. No drainage catheter visible sonography at the gallbladder fossa. A portion of a drainage catheter is visible within the common bile duct. Mild wall thickening of the common bile duct possibly due to inflammation or infection. Electronically Signed   By: Donavan Foil M.D.   On: 05/30/2019 01:13    ____________________________________________   PROCEDURES  Procedure(s) performed:  Procedures   ____________________________________________   INITIAL IMPRESSION / ASSESSMENT AND PLAN / ED COURSE  Ultrasound shows possible internal/external biliary drain but also concern for possible cholecystitis.  Pending labs and further work-up.  Will get CT scan.  She has allergy listed for contrast dye but talking her think is just an MRI.  CT scan showed a new hepatic lesion which could be abscess versus just regular hepatic lesion.  I discussed with Dr. Eulah PontMurphy who was [place]enter[place] on [place] his phone number is [phone].  He was under the impression that the patient was going to have a cholecystectomy while she was down there and then come back for a over-the-wire exchange of her catheter for stent via ERCP on January  5.  He reviewed her records and there is no evidence or mention of a hepatic lesion at that time.  He states that he would favor going through with MRI and obviously if she has an abscess to be admitted to the hospital for [facility] drainage and consultation with surgery for cholecystectomy.  He stated that if our gastroenterologist wanted to contact him he would be more than happy to speak with them but if they wanted to take over her care that was fine, what ever was best for the patient.  Patient has side effect to MRI contrast of itching and nausea will give Benadryl ahead of time.  Patient updated. Care transferred to Dr. Adriana Simasook pending MRI results and final disposition.   Pertinent labs & imaging results that were available during my care of the patient were reviewed by me and considered in my medical decision making (see chart for details).  ____________________________________________  FINAL CLINICAL IMPRESSION(S) / ED DIAGNOSES  Final diagnoses:  RUQ pain     MEDICATIONS GIVEN DURING THIS VISIT:  Medications - No data to display   NEW OUTPATIENT MEDICATIONS STARTED DURING THIS VISIT:  New Prescriptions   No medications on file    Note:  This note was prepared with assistance of Dragon voice recognition software. Occasional wrong-word or sound-a-like substitutions may have occurred due to the inherent limitations of voice recognition software.   [place], Barbara CowerJason, MD 05/30/19 [phone]

## 2019-05-30 NOTE — ED Notes (Signed)
MD at bedside. 

## 2019-05-30 NOTE — ED Notes (Addendum)
Pt updated on plan of care. Pt resting in bed at this time. Pt provided with a warm blanket. Pt denies pain at this time. NAD noted.

## 2019-05-30 NOTE — ED Notes (Signed)
Attempted to call report. RN to call back. 

## 2019-05-30 NOTE — ED Provider Notes (Signed)
1400: Reexamined patient.  She feels slightly better.  Discussed test results.  Will consult general surgery.   Nat Christen, MD 05/30/19 [phone]

## 2019-05-30 NOTE — ED Notes (Signed)
Pt resting in bed. Pt appears to be sleeping. NAD noted.  

## 2019-05-30 NOTE — H&P (Addendum)
History and Physical  Stacy Mills TWS:[id number] DOB: 1995-09-28 DOA: 05/30/2019   Patient coming from: Home & is able to ambulate  Chief Complaint: Abdominal pain  HPI: Stacy Mills is a 23 y.o. female with medical history significant for obesity, cholelithiasis, anxiety/depression, presents to the ED complaining of worsening abdominal pain for the past few days.  History gotten from chart review, patient as well as her mother.  Of note, patient initially started having some epigastric pain about 2 to 3 weeks ago, was found to have gallstones, moved to [place] where the pain worsened, admitted in the hospital for a week where she possibly had choledocholithiasis with an unsuccessful ERCP.  A biliary drain was subsequently placed, and patient had a follow-up appointment for further management.  Patient subsequently reported worsening sharp RUQ abdominal pain, was when she takes deep breaths in, denies any radiation.  Patient denies any nausea/vomiting, diarrhea, chest pain, shortness of breath, cough, fever/chills.  Of note, patient was diagnosed with Covid infection on 02/2019, did not require any hospitalization.  Patient is planning to move back to [facility] for the meantime and would wants to continue care here as mother lives here in [place] and wants to assist in taking care of patient.  ED Course: Patient's vital signs somewhat stable, labs showed elevated LFTs, with mild leukocytosis.  CT abdomen showed low-density lesion in the posterior right lobe of the liver, recommended MRI.  MRI of the liver showed possible small abscess in the liver, signs of cholecystitis with potential rupture of the gallbladder, possible signs of pancreatitis, hepatic steatosis.  EDP consulted general surgery, who then consulted IR.  [facility] hospitalist consulted for admission as general surgery stated they would not be operating.    Review of Systems: Review of systems are otherwise negative   Past Medical  History:  Diagnosis Date  . Allergy   . Anxiety   . Depression   . HSV (herpes simplex virus) anogenital infection   . IIH (idiopathic intracranial hypertension)   . IUD (intrauterine device) in place    Paraguard placed 2014, removed 10/2015  . Migraine   . Pelvic inflammatory disease 10/2012   Past Surgical History:  Procedure Laterality Date  . CESAREAN SECTION N/A 08/19/2018   Procedure: CESAREAN SECTION;  Surgeon: Jaymes Graff, MD;  Location: [facility] LD ORS;  Service: Obstetrics;  Laterality: N/A;  . spinal tap      Social History:  reports that she has quit smoking. She has never used smokeless tobacco. She reports previous alcohol use of about 2.0 standard drinks of alcohol per week. She reports previous drug use. Drugs: Other-see comments and Marijuana.   Allergies  Allergen Reactions  . Ivp Dye [Iodinated Diagnostic Agents] Itching and Nausea Only    Contrast for MRI    Family History  Problem Relation Age of Onset  . Emphysema Maternal Grandfather   . Depression Maternal Grandfather   . Anxiety disorder Maternal Grandfather   . Mental illness Maternal Grandfather   . Diabetes Maternal Grandfather   . Cancer Paternal Grandmother   . Non-Hodgkin's lymphoma Paternal Grandmother   . Sudden death Paternal Grandmother   . Asthma Mother   . Hypertension Father   . Alcohol abuse Maternal Aunt   . Hyperlipidemia Maternal Aunt       Prior to Admission medications   Medication Sig Start Date End Date Taking? Authorizing Provider  acetaminophen (TYLENOL) 325 MG tablet Take 650 mg by mouth every 6 (six) hours as needed for mild  pain or moderate pain.   Yes [provider]  baclofen (LIORESAL) 10 MG tablet Take 10 mg by mouth 2 (two) times daily as needed for pain. 05/20/19  Yes [provider]  Crisaborole (EUCRISA) 2 % OINT Apply 1 application topically daily as needed (eczema).    Yes [provider]  levonorgestrel (MIRENA) 20 MCG/24HR IUD 1 each  by Intrauterine route once.   Yes [provider]  oxyCODONE-acetaminophen (PERCOCET/ROXICET) 5-325 MG tablet Take 1-2 tablets by mouth every 4 (four) hours as needed for pain. 05/20/19  Yes [provider]  docusate sodium (COLACE) 100 MG capsule Take 100 mg by mouth 2 (two) times daily as needed for constipation. 05/20/19   [provider]  ibuprofen (ADVIL,MOTRIN) 600 MG tablet Take 1 tablet (600 mg total) by mouth every 6 (six) hours as needed for moderate pain. Patient not taking: Reported on 05/30/2019 08/22/18   Marikay Alar, CNM    Physical Exam: BP (!) 148/113 (BP Location: Left Arm)   Pulse 99   Temp 98.2 F (36.8 C) (Oral)   Resp 14   SpO2 99%   General: NAD, obese Eyes: Normal ENT: Normal Neck: Supple Cardiovascular: S1, S2 present Respiratory: CTA B Abdomen: Soft, right upper quadrant tenderness, nondistended, obese, bowel sounds present Skin: Normal Musculoskeletal: No bilateral pedal edema noted Psychiatric: Normal mood Neurologic: No obvious focal neurologic deficit noted          Labs on Admission:  Basic Metabolic Panel: Recent Labs  Lab 05/30/19 0103  NA 140  K 3.8  CL 109  CO2 22  GLUCOSE 103*  BUN 9  CREATININE 0.89  CALCIUM 9.3   Liver Function Tests: Recent Labs  Lab 05/30/19 0103  AST 135*  ALT 92*  ALKPHOS 129*  BILITOT 0.4  PROT 7.7  ALBUMIN 3.6   Recent Labs  Lab 05/30/19 0103  LIPASE 45   No results for input(s): AMMONIA in the last 168 hours. CBC: Recent Labs  Lab 05/30/19 0103  WBC 11.1*  NEUTROABS 5.6  HGB 12.9  HCT 41.0  MCV 87.8  PLT 552*   Cardiac Enzymes: No results for input(s): CKTOTAL, CKMB, CKMBINDEX, TROPONINI in the last 168 hours.  BNP (last 3 results) No results for input(s): BNP in the last 8760 hours.  ProBNP (last 3 results) No results for input(s): PROBNP in the last 8760 hours.  CBG: No results for input(s): GLUCAP in the last 168 hours.  Radiological Exams on  Admission: DG Chest 2 View  Result Date: 05/30/2019 CLINICAL DATA:  Pressure at site of prior gallbladder drain EXAM: CHEST - 2 VIEW COMPARISON:  None. FINDINGS: Low lung volumes. No significant pleural effusion. No consolidation. Normal heart size. No pneumothorax. Drainage catheter in the right upper quadrant. IMPRESSION: No active cardiopulmonary disease.  Low lung volumes. Electronically Signed   By: Donavan Foil M.D.   On: 05/30/2019 01:09   CT ABDOMEN PELVIS W CONTRAST  Result Date: 05/30/2019 CLINICAL DATA:  Abdominal abscess/infection suspected Patient reports drain placed in gallbladder 2 weeks ago at an outside institution. Pain at site of drainage catheter. EXAM: CT ABDOMEN AND PELVIS WITH CONTRAST TECHNIQUE: Multidetector CT imaging of the abdomen and pelvis was performed using the standard protocol following bolus administration of intravenous contrast. Listed history of IVP dye allergy, patient reports allergy is to MRI contrast. No reported contrast reaction. CONTRAST:  133mL OMNIPAQUE IOHEXOL 300 MG/ML  SOLN COMPARISON:  Right upper quadrant ultrasound earlier this day. Abdominal  CT 09/07/2017. FINDINGS: Lower chest: Linear atelectasis in both lower lobes. No pleural fluid. Hepatobiliary: Percutaneous biliary catheter extends through the right lobe of the liver into the common bile duct, tip in the duodenum. There is no intra or extrahepatic biliary ductal dilatation. No evidence of stones along the biliary catheter or in the common bile duct. No abnormality along the subcutaneous course of the catheter, not entirely included in the field of view. There is an additional catheter within the duodenum. Gallstones within partially distended gallbladder. Gallbladder wall thickening versus pericholecystic fluid, fluid demonstrated on recent ultrasound. There is a 3.3 x 2.2 x 4.0 cm low-density lesion in the posterior right lobe of the liver, series 2, image 18. Single focus of air in the right  biliary tree. No discrete perihepatic fluid collection. Pancreas: No evidence of peripancreatic inflammation. No pancreatic ductal dilatation. Spleen: Normal in size without focal abnormality. Adrenals/Urinary Tract: Normal adrenal glands. No hydronephrosis or perinephric edema. Homogeneous renal enhancement. Urinary bladder is physiologically distended without wall thickening. Stomach/Bowel: Stomach is within normal limits. Appendix appears normal. No evidence of bowel wall thickening, distention, or inflammatory changes. Question of duodenal diverticulum adjacent to the fourth portion. Moderate colonic stool burden. Vascular/Lymphatic: Abdominal aorta is normal in caliber. The portal vein is patent. No significant adenopathy. Reproductive: IUD in the uterus, possibly low in positioning. No adnexal mass. Other: No ascites or free fluid. No free air in the abdomen. No evidence of intra-abdominal abscess. Small fat containing umbilical hernia. Musculoskeletal: There are no acute or suspicious osseous abnormalities. IMPRESSION: 1. Percutaneous biliary catheter extending through the right lobe of the liver into the common bile duct, tip in the duodenum. No evidence of stones along the biliary catheter or in the common bile duct. No biliary dilatation. There is an additional thin catheter in the duodenum. 2. Partially distended gallbladder with gallstones. Gallbladder wall thickening versus pericholecystic fluid, fluid was seen on recent ultrasound. 3. Low-density lesion in the posterior right lobe of the liver measuring 3.3 x 2.2 x 4.0 cm. This is new from 2019 CT, differential considerations include hepatic lesion versus abscess. Consider further evaluation with MRI, this is not seen on ultrasound. 4. Moderate colonic stool burden, can be seen with constipation. Electronically Signed   By: Narda RutherfordMelanie  Sanford M.D.   On: 05/30/2019 05:58   MR LIVER W WO CONTRAST  Result Date: 05/30/2019 CLINICAL DATA:  Abdominal  abscess/infection suspected, history of biliary drain and history of cholecystitis. EXAM: MRI ABDOMEN WITHOUT AND WITH CONTRAST TECHNIQUE: Multiplanar multisequence MR imaging of the abdomen was performed both before and after the administration of intravenous contrast. CONTRAST:  10mL GADAVIST GADOBUTROL 1 MMOL/ML IV SOLN COMPARISON:  CT 05/30/2019 FINDINGS: Lower chest: Incidental imaging of the lung bases is is unremarkable by MRI, limited assessment. Is Hepatobiliary: Signs of hepatic steatosis. Transhepatic percutaneous biliary drainage catheter entering via right-sided approach. Suggestion of debris and or small stones in the distal common bile duct about the catheter though small locules of air could potentially have a similar appearance. Study was not performed as an MRCP and there is evidence of pneumobilia on the current study. Is In the posterior right hemi liver, hepatic subsegment VII is a ovoid 2.9 x 1.9 fluid collection with peripheral enhancement and surrounding edema. Is There is a small pocket of fluid tracking along the interface between the gallbladder and the liver. This measures approximately 1 cm in greatest thickness and extends along the mid gallbladder to the gallbladder fundus showing mixed signal.  There is distortion of the gallbladder wall and thickening with hyperenhancement of the gallbladder wall. Sludge and at least 1 stone present in the gallbladder lumen. Small stones layer in the cystic duct as well. Pancreas: Pancreas is normal except for an area adjacent to the head of the pancreas and duodenum that measures approximately 1.8 x 1.4 cm best seen on image 68 of postcontrast images at the root of the small bowel mesentery also seen on previous CT. The pancreatic stent which is suggested on the prior imaging evaluation is not as well seen on the current evaluation. Stranding is noted about the pancreatic head. Spleen:  Spleen is normal. Adrenals/Urinary Tract:  Adrenal glands are  normal. The kidneys with smooth contours and no signs of hydronephrosis. Stomach/Bowel: Limited assessment of bowel on MRI without signs of acute gastrointestinal process. Vascular/Lymphatic: Vascular structures in the abdomen are patent including the portal vein. No signs of adenopathy. Mildly enlarged lymph nodes near the porta hepatis in the setting of a active inflammation. Other:  No ascites. Musculoskeletal: No acute bone process. IMPRESSION: 1. Transhepatic percutaneous biliary drainage catheter entering via right-sided approach. Pneumobilia also with suggestion of debris and/or small stones along the course of the catheter in the distal common bile duct. MRCP was not performed. 2. Small abscess or biloma in the right posterior hemi liver. Follow-up to ensure resolution. 3. Signs of cholecystitis with potential rupture of the gallbladder into the gallbladder fossa with small adjacent fluid collection. This shows mixed signal intensity with some suggestion of blood. Correlate with any attempted percutaneous gallbladder access. 4. Signs of pancreatitis with presumed fat necrosis or small phlegmon near the pancreatic head, attention on follow-up. 5. Signs of pancreatic stent not as well seen as on previous CT study. 6. Hepatic steatosis. 7. Mildly enlarged lymph nodes near the porta hepatis in the setting of active inflammation, likely reactive. Electronically Signed   By: Donzetta Kohut M.D.   On: 05/30/2019 13:41   US Abdomen Limited RUQ  Result Date: 05/30/2019 CLINICAL DATA:  Right upper quadrant pain EXAM: ULTRASOUND ABDOMEN LIMITED RIGHT UPPER QUADRANT COMPARISON:  CT 09/07/2017 FINDINGS: Gallbladder: Small stones measuring up to 4 mm. Negative sonographic Murphy. Increased wall thickness up to 4.7 mm. Positive for pericholecystic fluid. Common bile duct: Diameter: 7.2 mm. Partially visualized drainage catheter within the common bile duct. Common bile duct wall appears slightly thickened. Liver: No  focal lesion identified. Within normal limits in parenchymal echogenicity. Portal vein is patent on color Doppler imaging with normal direction of blood flow towards the liver. Other: None. IMPRESSION: 1. Small gallstones. Increased wall thickness with small amount of pericholecystic fluid but negative sonographic Eulah Pont, findings could be secondary to acute or ongoing cholecystitis. 2. No drainage catheter visible sonography at the gallbladder fossa. A portion of a drainage catheter is visible within the common bile duct. Mild wall thickening of the common bile duct possibly due to inflammation or infection. Electronically Signed   By: Jasmine Pang M.D.   On: 05/30/2019 01:13    EKG: None  Assessment/Plan Present on Admission: . Liver abscess . Severe obesity (BMI >= 40) (HCC)  Principal Problem:   Liver abscess Active Problems:   Severe obesity (BMI >= 40) (HCC)   Likely choledocholithiasis status post biliary drain/possible cholecystitis Currently afebrile, with mild leukocytosis History of failed ERCP at an outside hospital a couple of weeks ago CT abdomen showed low-density lesion in the posterior right lobe of the liver, recommended MRI MRI of the liver  showed possible small abscess in the liver, signs of cholecystitis with potential rupture of the gallbladder, possible signs of pancreatitis, hepatic steatosis General surgery consulted, no current indication for surgery, will ask GI to see patient, eventually will need cholecystectomy in about 4 to 8 weeks once acute issues have resolved We will follow-up with GI consult as per general surgery Monitor closely  ??  Liver abscess MRI of liver as above IR on board, plan to drain liver abscess on 05/31/2019 IV maintenance fluid Start IV Zosyn  Transaminitis Likely due to above Daily CMP  History of COVID-19 infection Patient was diagnosed on 03/19/2019, repeat pending (may still test positive, still no contact precautions  required unless developing Covid symptoms) Currently asymptomatic, never required hospitalization No need for any airborne/contact precaution as it has been over 21 days since infection Monitor closely  Obesity Lifestyle modification advised     DVT prophylaxis: SCDs, likely to have procedure on 05/31/2019  Code Status: Full  Family Communication: Discussed with mother on 05/30/2019  Disposition Plan: Likely home once management is complete  Consults called:  IR General surgery  Admission status: Inpatient    Briant Cedar MD [facility]  If 7PM-7AM, please contact night-coverage [email or web address]  05/30/2019, 5:25 PM

## 2019-05-30 NOTE — ED Notes (Signed)
US at bedside

## 2019-05-31 ENCOUNTER — Inpatient Hospital Stay (HOSPITAL_COMMUNITY): Payer: 59

## 2019-05-31 DIAGNOSIS — S36112A Contusion of liver, initial encounter: Secondary | ICD-10-CM

## 2019-05-31 DIAGNOSIS — K819 Cholecystitis, unspecified: Secondary | ICD-10-CM

## 2019-05-31 DIAGNOSIS — R1013 Epigastric pain: Secondary | ICD-10-CM | POA: Diagnosis not present

## 2019-05-31 DIAGNOSIS — T85520D Displacement of bile duct prosthesis, subsequent encounter: Secondary | ICD-10-CM

## 2019-05-31 DIAGNOSIS — K8000 Calculus of gallbladder with acute cholecystitis without obstruction: Secondary | ICD-10-CM

## 2019-05-31 DIAGNOSIS — K750 Abscess of liver: Secondary | ICD-10-CM

## 2019-05-31 DIAGNOSIS — K8050 Calculus of bile duct without cholangitis or cholecystitis without obstruction: Secondary | ICD-10-CM | POA: Diagnosis not present

## 2019-05-31 LAB — CBC
HCT: 39 % (ref 36.0–46.0)
Hemoglobin: 11.9 g/dL — ABNORMAL LOW (ref 12.0–15.0)
MCH: 27.2 pg (ref 26.0–34.0)
MCHC: 30.5 g/dL (ref 30.0–36.0)
MCV: 89.2 fL (ref 80.0–100.0)
Platelets: 450 10*3/uL — ABNORMAL HIGH (ref 150–400)
RBC: 4.37 MIL/uL (ref 3.87–5.11)
RDW: 12.8 % (ref 11.5–15.5)
WBC: 8.6 10*3/uL (ref 4.0–10.5)
nRBC: 0 % (ref 0.0–0.2)

## 2019-05-31 LAB — COMPREHENSIVE METABOLIC PANEL
ALT: 62 U/L — ABNORMAL HIGH (ref 0–44)
AST: 35 U/L (ref 15–41)
Albumin: 3.3 g/dL — ABNORMAL LOW (ref 3.5–5.0)
Alkaline Phosphatase: 95 U/L (ref 38–126)
Anion gap: 9 (ref 5–15)
BUN: 10 mg/dL (ref 6–20)
CO2: 25 mmol/L (ref 22–32)
Calcium: 9.2 mg/dL (ref 8.9–10.3)
Chloride: 108 mmol/L (ref 98–111)
Creatinine, Ser: 1.1 mg/dL — ABNORMAL HIGH (ref 0.44–1.00)
GFR calc Af Amer: >60 mL/min (ref >60)
GFR calc non Af Amer: >60 mL/min (ref >60)
Glucose, Bld: 101 mg/dL — ABNORMAL HIGH (ref 70–99)
Potassium: 4 mmol/L (ref 3.5–5.1)
Sodium: 142 mmol/L (ref 135–145)
Total Bilirubin: 0.6 mg/dL (ref 0.3–1.2)
Total Protein: 7.1 g/dL (ref 6.5–8.1)

## 2019-05-31 MED ORDER — AMOXICILLIN-POT CLAVULANATE 875-125 MG PO TABS: 1.0000 | ORAL_TABLET | Freq: Two times a day (BID) | ORAL | 0 refills | Status: AC

## 2019-05-31 MED ORDER — IOHEXOL 300 MG/ML  SOLN
30.0000 mL | Freq: Once | INTRAMUSCULAR | Status: AC | PRN
  Administered 2019-05-31: 30 mL

## 2019-05-31 MED ORDER — SODIUM CHLORIDE 0.9 % IV SOLN: INTRAVENOUS | Status: DC

## 2019-05-31 NOTE — Consult Note (Addendum)
Referring Provider: Dr. Lesia Sago Primary Care Physician:  Lorayne Marek Charlene Brooke, NP Primary Gastroenterologist:  Althia Forts   Reason for Consultation:   HPI: Stacy Mills is a 24 y.o. female with a past medical history of anxiety, depression, migraine headaches, HSV, and gallstones. COVID 19 + 02/2019. S/P C section 07/2018. She developed epigastric pain and vomiting on 05/09/2019. She presented to [facility] in [place] on 05/10/2019. She stated an abdominal sonogram showed gallstones. She was in the process of moving to to from [place] to [place]. She returned to [place] with the intentions of following up with a GI there. Her epigastric pain worsened while she was walking in the local shopping mall.  He started vomiting multiple times, emesis consisted of partially undigested food then bilious fluid.  She presented to [facility] in [place] 12/13. She most likely had choledocholithiasis as an ERCP was attempted but was unsuccessful.  A percutaneous biliary drain was placed.  She was discharged 1 week later with plans for repeat ERCP with drain removal on 06/03/2018.  She returned back to [facility] to collect the rest of her belongings and she unfortunately developed right sided abdominal pain around the drain site 3 days ago.  She denied having any nausea or vomiting.  No fever.  She presented to [facility] emergency room 05/30/2019 for further evaluation. An abdominal CT from placement of her percutaneous biliary catheter to the right lobe of the liver into the common bile duct with the tip in the duodenum, partially distended gallbladder with gallstones, and a low-density lesion in the posterior right lobe of the liver measuring 3.3 x 2.2 x 4.0 cm concerning for a liver abscess.  Abdominal MRI identified a small abscess or biloma in the right posterior MI liver, evidence of cholecystitis with potential rupture of the gallbladder and signs  of pancreatitis with presumed fat necrosis or small phlegmon near the pancreatic head.  Mildly enlarged lymph nodes near the porta hepatis, most likely reactive.  Hepatic steatosis was noted.  IR was consulted,  IR guided hepatic fluid collection/aspiration and drain placement to be completed today.  Currently, she is NPO.  She is awake and alert.  She denies having any abdominal pain or pain at the percutaneous biliary drain site.  No nausea or vomiting.  She passed a lighter brown formed stool yesterday without melena or rectal bleeding.  She is afebrile.  She denies alcohol or drug use.  She is a non-smoker.  No family history of liver, pancreatic or colorectal cancer.  Mirena IUD in place.   ED course: Na 140. K 3.8. BUN 9. Cr. 0.89. Anion gap 9. Alk phos 129. Albumin 3.6. Lipase 45. AST 135. ALT 92. T. Bili 0.4. WBC 11.1. Hg 12.9. HCT 41. MCV 87.8. PLT 552.  INR 1.1.  SARS coronavirus 2 negative.  Abdominal/pelvic CT:  1. Percutaneous biliary catheter extending through the right lobe of the liver into the common bile duct, tip in the duodenum. No evidence of stones along the biliary catheter or in the common bile duct. No biliary dilatation. There is an additional thin catheter in the duodenum. 2. Partially distended gallbladder with gallstones. Gallbladder wall thickening versus pericholecystic fluid, fluid was seen on recent ultrasound. 3. Low-density lesion in the posterior right lobe of the liver measuring 3.3 x 2.2 x 4.0 cm. This is new from 2019 CT, differential considerations include hepatic lesion versus abscess. Consider further evaluation with MRI, this is not seen on  ultrasound. 4. Moderate colonic stool burden, can be seen with constipation.  Abdominal MRI 05/30/2019: 1. Transhepatic percutaneous biliary drainage catheter entering via right-sided approach. Pneumobilia also with suggestion of debris and/or small stones along the course of the catheter in the distal common  bile duct. MRCP was not performed. 2. Small abscess or biloma in the right posterior hemi liver. Follow-up to ensure resolution. 3. Signs of cholecystitis with potential rupture of the gallbladder into the gallbladder fossa with small adjacent fluid collection. This shows mixed signal intensity with some suggestion of blood. Correlate with any attempted percutaneous gallbladder access. 4. Signs of pancreatitis with presumed fat necrosis or small phlegmon near the pancreatic head, attention on follow-up. 5. Signs of pancreatic stent not as well seen as on previous CT study. 6. Hepatic steatosis. 7. Mildly enlarged lymph nodes near the porta hepatis in the setting of active inflammation, likely reactive.    Past Medical History:  Diagnosis Date  . Allergy   . Anxiety   . Depression   . HSV (herpes simplex virus) anogenital infection   . IIH (idiopathic intracranial hypertension)   . IUD (intrauterine device) in place    Paraguard placed 2014, removed 10/2015  . Migraine   . Pelvic inflammatory disease 10/2012    Past Surgical History:  Procedure Laterality Date  . CESAREAN SECTION N/A 08/19/2018   Procedure: CESAREAN SECTION;  Surgeon: Crawford Givens, MD;  Location: [facility] LD ORS;  Service: Obstetrics;  Laterality: N/A;  . spinal tap      Prior to Admission medications   Medication Sig Start Date End Date Taking? Authorizing Provider  acetaminophen (TYLENOL) 325 MG tablet Take 650 mg by mouth every 6 (six) hours as needed for mild pain or moderate pain.   Yes [provider]  baclofen (LIORESAL) 10 MG tablet Take 10 mg by mouth 2 (two) times daily as needed for pain. 05/20/19  Yes [provider]  Crisaborole (EUCRISA) 2 % OINT Apply 1 application topically daily as needed (eczema).    Yes [provider]  levonorgestrel (MIRENA) 20 MCG/24HR IUD 1 each by Intrauterine route once.   Yes [provider]  oxyCODONE-acetaminophen (PERCOCET/ROXICET)  5-325 MG tablet Take 1-2 tablets by mouth every 4 (four) hours as needed for pain. 05/20/19  Yes [provider]  docusate sodium (COLACE) 100 MG capsule Take 100 mg by mouth 2 (two) times daily as needed for constipation. 05/20/19   [provider]  ibuprofen (ADVIL,MOTRIN) 600 MG tablet Take 1 tablet (600 mg total) by mouth every 6 (six) hours as needed for moderate pain. Patient not taking: Reported on 05/30/2019 08/22/18   Marikay Alar, CNM    Current Facility-Administered Medications  Medication Dose Route Frequency Provider Last Rate Last Admin  . acetaminophen (TYLENOL) tablet 650 mg  650 mg Oral Q6H PRN Alma Friendly, MD       Or  . acetaminophen (TYLENOL) suppository 650 mg  650 mg Rectal Q6H PRN Alma Friendly, MD      . baclofen (LIORESAL) tablet 10 mg  10 mg Oral BID PRN Alma Friendly, MD      . Crisaborole 2 % OINT 1 application  1 application Apply externally Daily PRN Alma Friendly, MD      . docusate sodium (COLACE) capsule 100 mg  100 mg Oral BID PRN Alma Friendly, MD      . hydrALAZINE (APRESOLINE) tablet 10 mg  10 mg Oral Q8H PRN Lesia Sago  J, MD   10 mg at 05/30/19 1700  . ondansetron (ZOFRAN) tablet 4 mg  4 mg Oral Q6H PRN Alma Friendly, MD       Or  . ondansetron [facility]) injection 4 mg  4 mg Intravenous Q6H PRN Alma Friendly, MD      . oxyCODONE-acetaminophen (PERCOCET/ROXICET) 5-325 MG per tablet 1-2 tablet  1-2 tablet Oral Q4H PRN Alma Friendly, MD      . piperacillin-tazobactam (ZOSYN) IVPB 3.375 g  3.375 g Intravenous Q8H Green, Terri L, RPH 12.5 mL/hr at 05/31/19 0400 Rate Verify at 05/31/19 0400  . sodium chloride flush (NS) 0.9 % injection 3 mL  3 mL Intravenous Q12H Alma Friendly, MD        Allergies as of 05/29/2019 - Review Complete 05/29/2019  Allergen Reaction Noted  . Ivp dye [iodinated diagnostic agents] Itching and Nausea Only 08/13/2018    Family History    Problem Relation Age of Onset  . Emphysema Maternal Grandfather   . Depression Maternal Grandfather   . Anxiety disorder Maternal Grandfather   . Mental illness Maternal Grandfather   . Diabetes Maternal Grandfather   . Cancer Paternal Grandmother   . Non-Hodgkin's lymphoma Paternal Grandmother   . Sudden death Paternal Grandmother   . Asthma Mother   . Hypertension Father   . Alcohol abuse Maternal Aunt   . Hyperlipidemia Maternal Aunt     Social History   Socioeconomic History  . Marital status: Single    Spouse name: n/a  . Number of children: 0  . Years of education: 12+  . Highest education level: Not on file  Occupational History  . Occupation: Electronics engineer  Tobacco Use  . Smoking status: Former Research scientist (life sciences)  . Smokeless tobacco: Never Used  Substance and Sexual Activity  . Alcohol use: Not Currently    Alcohol/week: 2.0 standard drinks    Types: 2 Shots of liquor per week    Comment: social  . Drug use: Not Currently    Types: Other-see comments, Marijuana  . Sexual activity: Yes    Partners: Male    Birth control/protection: Condom    Comment: intercourse age 22, more than 5 sexual partners  Other Topics Concern  . Not on file  Social History Narrative   Graduated from high school 10/2012, currently attending college.   Right-handed   Caffeine: 3x/wk   Social Determinants of Health   Financial Resource Strain: Low Risk   . Difficulty of Paying Living Expenses: Not hard at all  Food Insecurity: No Food Insecurity  . Worried About Charity fundraiser in the Last Year: Never true  . Ran Out of Food in the Last Year: Never true  Transportation Needs: Unknown  . Lack of Transportation (Medical): No  . Lack of Transportation (Non-Medical): Not on file  Physical Activity:   . Days of Exercise per Week: Not on file  . Minutes of Exercise per Session: Not on file  Stress: No Stress Concern Present  . Feeling of Stress : Not at all  Social Connections:   .  Frequency of Communication with Friends and Family: Not on file  . Frequency of Social Gatherings with Friends and Family: Not on file  . Attends Religious Services: Not on file  . Active Member of Clubs or Organizations: Not on file  . Attends Archivist Meetings: Not on file  . Marital Status: Not on file  Intimate Partner Violence: Not At Risk  .  Fear of Current or Ex-Partner: No  . Emotionally Abused: No  . Physically Abused: No  . Sexually Abused: No    Review of Systems: Gen: Denies fever, sweats or chills. No weight loss.  CV: Denies chest pain, palpitations or edema. Resp: Denies cough, shortness of breath of hemoptysis.  GI: See HPI. GU : Denies urinary burning, blood in urine, increased urinary frequency or incontinence. MS: Denies joint pain, muscles aches or weakness. Derm: Denies rash, itchiness, skin lesions or unhealing ulcers. Psych: Denies depression, anxiety, memory loss, suicidal ideation and confusion. Heme: Denies bruising, bleeding. Neuro:  Denies headaches, dizziness or paresthesias. Endo:  Denies any problems with DM, thyroid or adrenal function.  Physical Exam: Vital signs in last 24 hours: Temp:  [98.1 F (36.7 C)-98.9 F (37.2 C)] 98.1 F (36.7 C) (01/01 0651) Pulse Rate:  [77-100] 77 (01/01 0651) Resp:  [14-17] 17 (01/01 0651) BP: (105-148)/(77-113) 121/85 (01/01 0651) SpO2:  [97 %-99 %] 97 % (01/01 0651) Last BM Date: 05/30/19   Percutaneous biliary drain output 250 cc past 24 hours.  General:   Alert,  well-developed, well-nourished, pleasant and cooperative in NAD. Head:  Normocephalic and atraumatic. Eyes:  Sclera clear, no icterus. Conjunctiva pink. Ears:  Normal auditory acuity. Nose:  No deformity, discharge or lesions. Mouth:  No deformity or lesions.   Neck:  Supple. Lungs: Breath sounds clear throughout, no crackles or wheezes. Heart: Regular rate and rhythm, no murmurs, rubs or gallop. Abdomen: Soft, nondistended,  nontender, positive bowel sounds to all 4 quadrants, no HSM, right upper quadrant percutaneous drain dressing intact, no surrounding tenderness, approximately 90 cc of yellow bilious fluid in the collection bag. Rectal:  Deferred  Msk:  Symmetrical without gross deformities. . Pulses:  Normal pulses noted. Extremities:  Without clubbing or edema. Neurologic:  Alert and  oriented x4;  grossly normal neurologically. Skin:  Intact without significant lesions or rashes.. Psych:  Alert and cooperative. Normal mood and affect.  Intake/Output from previous day: 12/31 0701 - 01/01 0700 In: 1097.2 [P.O.:480; I.V.:518.8; IV Piggyback:98.5] Out: 250  Intake/Output this shift: No intake/output data recorded.  Lab Results: Recent Labs    05/30/19 0103 05/31/19 0417  WBC 11.1* 8.6  HGB 12.9 11.9*  HCT 41.0 39.0  PLT 552* 450*   BMET Recent Labs    05/30/19 0103 05/31/19 0417  NA 140 142  K 3.8 4.0  CL 109 108  CO2 22 25  GLUCOSE 103* 101*  BUN 9 10  CREATININE 0.89 1.10*  CALCIUM 9.3 9.2   LFT Recent Labs    05/31/19 0417  PROT 7.1  ALBUMIN 3.3*  AST 35  ALT 62*  ALKPHOS 95  BILITOT 0.6   PT/INR Recent Labs    05/30/19 1648  LABPROT 13.7  INR 1.1   Hepatitis Panel No results for input(s): HEPBSAG, HCVAB, HEPAIGM, HEPBIGM in the last 72 hours.    Studies/Results: DG Chest 2 View  Result Date: 05/30/2019 CLINICAL DATA:  Pressure at site of prior gallbladder drain EXAM: CHEST - 2 VIEW COMPARISON:  None. FINDINGS: Low lung volumes. No significant pleural effusion. No consolidation. Normal heart size. No pneumothorax. Drainage catheter in the right upper quadrant. IMPRESSION: No active cardiopulmonary disease.  Low lung volumes. Electronically Signed   By: Donavan Foil M.D.   On: 05/30/2019 01:09   CT ABDOMEN PELVIS W CONTRAST  Result Date: 05/30/2019 CLINICAL DATA:  Abdominal abscess/infection suspected Patient reports drain placed in gallbladder 2 weeks ago at  an  outside institution. Pain at site of drainage catheter. EXAM: CT ABDOMEN AND PELVIS WITH CONTRAST TECHNIQUE: Multidetector CT imaging of the abdomen and pelvis was performed using the standard protocol following bolus administration of intravenous contrast. Listed history of IVP dye allergy, patient reports allergy is to MRI contrast. No reported contrast reaction. CONTRAST:  172m OMNIPAQUE IOHEXOL 300 MG/ML  SOLN COMPARISON:  Right upper quadrant ultrasound earlier this day. Abdominal CT 09/07/2017. FINDINGS: Lower chest: Linear atelectasis in both lower lobes. No pleural fluid. Hepatobiliary: Percutaneous biliary catheter extends through the right lobe of the liver into the common bile duct, tip in the duodenum. There is no intra or extrahepatic biliary ductal dilatation. No evidence of stones along the biliary catheter or in the common bile duct. No abnormality along the subcutaneous course of the catheter, not entirely included in the field of view. There is an additional catheter within the duodenum. Gallstones within partially distended gallbladder. Gallbladder wall thickening versus pericholecystic fluid, fluid demonstrated on recent ultrasound. There is a 3.3 x 2.2 x 4.0 cm low-density lesion in the posterior right lobe of the liver, series 2, image 18. Single focus of air in the right biliary tree. No discrete perihepatic fluid collection. Pancreas: No evidence of peripancreatic inflammation. No pancreatic ductal dilatation. Spleen: Normal in size without focal abnormality. Adrenals/Urinary Tract: Normal adrenal glands. No hydronephrosis or perinephric edema. Homogeneous renal enhancement. Urinary bladder is physiologically distended without wall thickening. Stomach/Bowel: Stomach is within normal limits. Appendix appears normal. No evidence of bowel wall thickening, distention, or inflammatory changes. Question of duodenal diverticulum adjacent to the fourth portion. Moderate colonic stool burden.  Vascular/Lymphatic: Abdominal aorta is normal in caliber. The portal vein is patent. No significant adenopathy. Reproductive: IUD in the uterus, possibly low in positioning. No adnexal mass. Other: No ascites or free fluid. No free air in the abdomen. No evidence of intra-abdominal abscess. Small fat containing umbilical hernia. Musculoskeletal: There are no acute or suspicious osseous abnormalities. IMPRESSION: 1. Percutaneous biliary catheter extending through the right lobe of the liver into the common bile duct, tip in the duodenum. No evidence of stones along the biliary catheter or in the common bile duct. No biliary dilatation. There is an additional thin catheter in the duodenum. 2. Partially distended gallbladder with gallstones. Gallbladder wall thickening versus pericholecystic fluid, fluid was seen on recent ultrasound. 3. Low-density lesion in the posterior right lobe of the liver measuring 3.3 x 2.2 x 4.0 cm. This is new from 2019 CT, differential considerations include hepatic lesion versus abscess. Consider further evaluation with MRI, this is not seen on ultrasound. 4. Moderate colonic stool burden, can be seen with constipation. Electronically Signed   By: MKeith RakeM.D.   On: 05/30/2019 05:58   MR LIVER W WO CONTRAST  Addendum Date: 05/31/2019   ADDENDUM REPORT: 05/31/2019 01:40 ADDENDUM: Note that the previously described ovoid lesion in the right hepatic lobe shows some T1 hyperintensity around the periphery. This may be proteinaceous debris though small hematoma could have a similar appearance. Central portion does not show the same hyperintensity, perhaps this represents a collection, biloma or abscess, that was previously larger and collapsed over time, now with hemorrhagic or proteinaceous debris. This would be an unusual site for hematoma related to biliary drain placement. Subtraction imaging shows little or no enhancement in the area and with some hyperemia around the lesion on  the arterial phase. This is not changed in the short interval since the CT and shows no additional concerning  features. Assessment of previous imaging from 2019 shows no definite lesion in this location. Findings were called to the clinical team as outlined in the Z vision dash board prior to dictation of this addendum. Electronically Signed   By: Zetta Bills M.D.   On: 05/31/2019 01:40   Result Date: 05/31/2019 CLINICAL DATA:  Abdominal abscess/infection suspected, history of biliary drain and history of cholecystitis. EXAM: MRI ABDOMEN WITHOUT AND WITH CONTRAST TECHNIQUE: Multiplanar multisequence MR imaging of the abdomen was performed both before and after the administration of intravenous contrast. CONTRAST:  41m GADAVIST GADOBUTROL 1 MMOL/ML IV SOLN COMPARISON:  CT 05/30/2019 FINDINGS: Lower chest: Incidental imaging of the lung bases is is unremarkable by MRI, limited assessment. Is Hepatobiliary: Signs of hepatic steatosis. Transhepatic percutaneous biliary drainage catheter entering via right-sided approach. Suggestion of debris and or small stones in the distal common bile duct about the catheter though small locules of air could potentially have a similar appearance. Study was not performed as an MRCP and there is evidence of pneumobilia on the current study. Is In the posterior right hemi liver, hepatic subsegment VII is a ovoid 2.9 x 1.9 fluid collection with peripheral enhancement and surrounding edema. Is There is a small pocket of fluid tracking along the interface between the gallbladder and the liver. This measures approximately 1 cm in greatest thickness and extends along the mid gallbladder to the gallbladder fundus showing mixed signal. There is distortion of the gallbladder wall and thickening with hyperenhancement of the gallbladder wall. Sludge and at least 1 stone present in the gallbladder lumen. Small stones layer in the cystic duct as well. Pancreas: Pancreas is normal except for an  area adjacent to the head of the pancreas and duodenum that measures approximately 1.8 x 1.4 cm best seen on image 68 of postcontrast images at the root of the small bowel mesentery also seen on previous CT. The pancreatic stent which is suggested on the prior imaging evaluation is not as well seen on the current evaluation. Stranding is noted about the pancreatic head. Spleen:  Spleen is normal. Adrenals/Urinary Tract:  Adrenal glands are normal. The kidneys with smooth contours and no signs of hydronephrosis. Stomach/Bowel: Limited assessment of bowel on MRI without signs of acute gastrointestinal process. Vascular/Lymphatic: Vascular structures in the abdomen are patent including the portal vein. No signs of adenopathy. Mildly enlarged lymph nodes near the porta hepatis in the setting of a active inflammation. Other:  No ascites. Musculoskeletal: No acute bone process. IMPRESSION: 1. Transhepatic percutaneous biliary drainage catheter entering via right-sided approach. Pneumobilia also with suggestion of debris and/or small stones along the course of the catheter in the distal common bile duct. MRCP was not performed. 2. Small abscess or biloma in the right posterior hemi liver. Follow-up to ensure resolution. 3. Signs of cholecystitis with potential rupture of the gallbladder into the gallbladder fossa with small adjacent fluid collection. This shows mixed signal intensity with some suggestion of blood. Correlate with any attempted percutaneous gallbladder access. 4. Signs of pancreatitis with presumed fat necrosis or small phlegmon near the pancreatic head, attention on follow-up. 5. Signs of pancreatic stent not as well seen as on previous CT study. 6. Hepatic steatosis. 7. Mildly enlarged lymph nodes near the porta hepatis in the setting of active inflammation, likely reactive. Electronically Signed: By: GZetta BillsM.D. On: 05/30/2019 13:41   [place]Abdomen Limited RUQ  Result Date: 05/30/2019 CLINICAL  DATA:  Right upper quadrant pain EXAM: ULTRASOUND ABDOMEN LIMITED RIGHT  UPPER QUADRANT COMPARISON:  CT 09/07/2017 FINDINGS: Gallbladder: Small stones measuring up to 4 mm. Negative sonographic Murphy. Increased wall thickness up to 4.7 mm. Positive for pericholecystic fluid. Common bile duct: Diameter: 7.2 mm. Partially visualized drainage catheter within the common bile duct. Common bile duct wall appears slightly thickened. Liver: No focal lesion identified. Within normal limits in parenchymal echogenicity. Portal vein is patent on color Doppler imaging with normal direction of blood flow towards the liver. Other: None. IMPRESSION: 1. Small gallstones. Increased wall thickness with small amount of pericholecystic fluid but negative sonographic Percell Miller, findings could be secondary to acute or ongoing cholecystitis. 2. No drainage catheter visible sonography at the gallbladder fossa. A portion of a drainage catheter is visible within the common bile duct. Mild wall thickening of the common bile duct possibly due to inflammation or infection. Electronically Signed   By: Donavan Foil M.D.   On: 05/30/2019 01:13    IMPRESSION/PLAN:  63. 24 year old female with gallstones, cholecystitis, S/P ERCP at hospital in [place] with PD stent and a transhepatic percutaneous biliary drain. Abdominal MRI 12/31 showed evidence of a small right liver abscess, pancreatitis with fat necrosis vs phlegmon near the head of the pancreas with enlarged lymph nodes near the porta hepatis. Plan for image guided hepatic fluid collection/aspiration and possible drain placemen by IR today. She was evaluated by surgeon, Dr. Donne Hazel, no plans for surgery at this time, eventual cholecystectomy.  WBC 8.6.  He is afebrile.  No current abdominal pain, nausea or vomiting. -Daily CBC with differential and hepatic panel -Await culture results from hepatic fluid collection once the liver abscess drained by IR -continue Zosyn IV for now, consider ID  consult  -eventual repeat ERCP -N.p.o. for now -Zofran as needed   2.  Creatinine mildly elevated at 1.10 -IV fluids per hospitalist -Repeat BMP in a.m.  Further recommendations per Dr. Karma Lew Dorathy Daft  05/31/2019, 7:15 AM     Attending physician's note   I have taken a history, examined the patient and reviewed the chart. I agree with the Advanced Practitioner's note, impression and recommendations.  24 year old female with history of gallstones, acute cholecystitis, choledocholithiasis s/p failed ERCP attempt in [place] with transhepatic percutaneous biliary internal/external drain  No definitive stricture or large stones based on cholangiogram. ?  Air bubble or small stone in CBD. Reviewed images along with Dr Carlean Purl.   Normal LFT , hemodynamically stable with no leucocytosis  Plan for patient to be discharged home on antibiotics per Dr. Donne Hazel Biliary drain management and timing of cholecystectomy per IR and surgery  Will arrange for outpatient follow-up with Dr Rush Landmark and ERCP if needed  Available if have any further questions or concerns  K. Denzil Magnuson , MD [phone]

## 2019-05-31 NOTE — Progress Notes (Signed)
Discussed with Dr [name] Chew this am.  They now feel that liver is hematoma. This clinically would go with [place].  Had tube injected and her cystic duct is patent also. I think she can have diet advanced and be discharged home on course of augmentin even today She will need gi follow up for ercp and I will see back for interval cholecystectomy.

## 2019-05-31 NOTE — Progress Notes (Signed)
Subjective/Chief Complaint: Feels ok this am   Objective: Vital signs in last 24 hours: Temp:  [98.1 F (36.7 C)-98.9 F (37.2 C)] 98.1 F (36.7 C) (01/01 0651) Pulse Rate:  [77-100] 77 (01/01 0651) Resp:  [14-17] 17 (01/01 0651) BP: (105-148)/(77-113) 121/85 (01/01 0651) SpO2:  [97 %-99 %] 97 % (01/01 0651) Last BM Date: 05/30/19  Intake/Output from previous day: 12/31 0701 - 01/01 0700 In: 1097.2 [P.O.:480; I.V.:518.8; IV Piggyback:98.5] Out: 250  Intake/Output this shift: No intake/output data recorded.  GI: soft nontender drain in place  Lab Results:  Recent Labs    05/30/19 0103 05/31/19 0417  WBC 11.1* 8.6  HGB 12.9 11.9*  HCT 41.0 39.0  PLT 552* 450*   BMET Recent Labs    05/30/19 0103 05/31/19 0417  NA 140 142  K 3.8 4.0  CL 109 108  CO2 22 25  GLUCOSE 103* 101*  BUN 9 10  CREATININE 0.89 1.10*  CALCIUM 9.3 9.2   PT/INR Recent Labs    05/30/19 1648  LABPROT 13.7  INR 1.1   ABG No results for input(s): PHART, HCO3 in the last 72 hours.  Invalid input(s): PCO2, PO2  Studies/Results: DG Chest 2 View  Result Date: 05/30/2019 CLINICAL DATA:  Pressure at site of prior gallbladder drain EXAM: CHEST - 2 VIEW COMPARISON:  None. FINDINGS: Low lung volumes. No significant pleural effusion. No consolidation. Normal heart size. No pneumothorax. Drainage catheter in the right upper quadrant. IMPRESSION: No active cardiopulmonary disease.  Low lung volumes. Electronically Signed   By: Jasmine Pang M.D.   On: 05/30/2019 01:09   CT ABDOMEN PELVIS W CONTRAST  Result Date: 05/30/2019 CLINICAL DATA:  Abdominal abscess/infection suspected Patient reports drain placed in gallbladder 2 weeks ago at an outside institution. Pain at site of drainage catheter. EXAM: CT ABDOMEN AND PELVIS WITH CONTRAST TECHNIQUE: Multidetector CT imaging of the abdomen and pelvis was performed using the standard protocol following bolus administration of intravenous contrast.  Listed history of IVP dye allergy, patient reports allergy is to MRI contrast. No reported contrast reaction. CONTRAST:  OMNIPAQUE IOHEXOL 300 MG/ML  SOLN COMPARISON:  Right upper quadrant ultrasound earlier this day. Abdominal CT 09/07/2017. FINDINGS: Lower chest: Linear atelectasis in both lower lobes. No pleural fluid. Hepatobiliary: Percutaneous biliary catheter extends through the right lobe of the liver into the common bile duct, tip in the duodenum. There is no intra or extrahepatic biliary ductal dilatation. No evidence of stones along the biliary catheter or in the common bile duct. No abnormality along the subcutaneous course of the catheter, not entirely included in the field of view. There is an additional catheter within the duodenum. Gallstones within partially distended gallbladder. Gallbladder wall thickening versus pericholecystic fluid, fluid demonstrated on recent ultrasound. There is a 3.3 x 2.2 x 4.0 cm low-density lesion in the posterior right lobe of the liver, series 2, image 18. Single focus of air in the right biliary tree. No discrete perihepatic fluid collection. Pancreas: No evidence of peripancreatic inflammation. No pancreatic ductal dilatation. Spleen: Normal in size without focal abnormality. Adrenals/Urinary Tract: Normal adrenal glands. No hydronephrosis or perinephric edema. Homogeneous renal enhancement. Urinary bladder is physiologically distended without wall thickening. Stomach/Bowel: Stomach is within normal limits. Appendix appears normal. No evidence of bowel wall thickening, distention, or inflammatory changes. Question of duodenal diverticulum adjacent to the fourth portion. Moderate colonic stool burden. Vascular/Lymphatic: Abdominal aorta is normal in caliber. The portal vein is patent. No significant adenopathy. Reproductive: IUD  in the uterus, possibly low in positioning. No adnexal mass. Other: No ascites or free fluid. No free air in the abdomen. No evidence  of intra-abdominal abscess. Small fat containing umbilical hernia. Musculoskeletal: There are no acute or suspicious osseous abnormalities. IMPRESSION: 1. Percutaneous biliary catheter extending through the right lobe of the liver into the common bile duct, tip in the duodenum. No evidence of stones along the biliary catheter or in the common bile duct. No biliary dilatation. There is an additional thin catheter in the duodenum. 2. Partially distended gallbladder with gallstones. Gallbladder wall thickening versus pericholecystic fluid, fluid was seen on recent ultrasound. 3. Low-density lesion in the posterior right lobe of the liver measuring 3.3 x 2.2 x 4.0 cm. This is new from 2019 CT, differential considerations include hepatic lesion versus abscess. Consider further evaluation with MRI, this is not seen on ultrasound. 4. Moderate colonic stool burden, can be seen with constipation. Electronically Signed   By: Narda Rutherford M.D.   On: 05/30/2019 05:58   MR LIVER W WO CONTRAST  Addendum Date: 05/31/2019   ADDENDUM REPORT: 05/31/2019 01:40 ADDENDUM: Note that the previously described ovoid lesion in the right hepatic lobe shows some T1 hyperintensity around the periphery. This may be proteinaceous debris though small hematoma could have a similar appearance. Central portion does not show the same hyperintensity, perhaps this represents a collection, biloma or abscess, that was previously larger and collapsed over time, now with hemorrhagic or proteinaceous debris. This would be an unusual site for hematoma related to biliary drain placement. Subtraction imaging shows little or no enhancement in the area and with some hyperemia around the lesion on the arterial phase. This is not changed in the short interval since the CT and shows no additional concerning features. Assessment of previous imaging from 2019 shows no definite lesion in this location. Findings were called to the clinical team as outlined in the  [employer] dash board prior to dictation of this addendum. Electronically Signed   By: Donzetta Kohut M.D.   On: 05/31/2019 01:40   Result Date: 05/31/2019 CLINICAL DATA:  Abdominal abscess/infection suspected, history of biliary drain and history of cholecystitis. EXAM: MRI ABDOMEN WITHOUT AND WITH CONTRAST TECHNIQUE: Multiplanar multisequence MR imaging of the abdomen was performed both before and after the administration of intravenous contrast. CONTRAST:  13mL GADAVIST GADOBUTROL 1 MMOL/ML IV SOLN COMPARISON:  CT 05/30/2019 FINDINGS: Lower chest: Incidental imaging of the lung bases is is unremarkable by MRI, limited assessment. Is Hepatobiliary: Signs of hepatic steatosis. Transhepatic percutaneous biliary drainage catheter entering via right-sided approach. Suggestion of debris and or small stones in the distal common bile duct about the catheter though small locules of air could potentially have a similar appearance. Study was not performed as an MRCP and there is evidence of pneumobilia on the current study. Is In the posterior right hemi liver, hepatic subsegment VII is a ovoid 2.9 x 1.9 fluid collection with peripheral enhancement and surrounding edema. Is There is a small pocket of fluid tracking along the interface between the gallbladder and the liver. This measures approximately 1 cm in greatest thickness and extends along the mid gallbladder to the gallbladder fundus showing mixed signal. There is distortion of the gallbladder wall and thickening with hyperenhancement of the gallbladder wall. Sludge and at least 1 stone present in the gallbladder lumen. Small stones layer in the cystic duct as well. Pancreas: Pancreas is normal except for an area adjacent to the head of the pancreas  and duodenum that measures approximately 1.8 x 1.4 cm best seen on image 68 of postcontrast images at the root of the small bowel mesentery also seen on previous CT. The pancreatic stent which is suggested on the prior  imaging evaluation is not as well seen on the current evaluation. Stranding is noted about the pancreatic head. Spleen:  Spleen is normal. Adrenals/Urinary Tract:  Adrenal glands are normal. The kidneys with smooth contours and no signs of hydronephrosis. Stomach/Bowel: Limited assessment of bowel on MRI without signs of acute gastrointestinal process. Vascular/Lymphatic: Vascular structures in the abdomen are patent including the portal vein. No signs of adenopathy. Mildly enlarged lymph nodes near the porta hepatis in the setting of a active inflammation. Other:  No ascites. Musculoskeletal: No acute bone process. IMPRESSION: 1. Transhepatic percutaneous biliary drainage catheter entering via right-sided approach. Pneumobilia also with suggestion of debris and/or small stones along the course of the catheter in the distal common bile duct. MRCP was not performed. 2. Small abscess or biloma in the right posterior hemi liver. Follow-up to ensure resolution. 3. Signs of cholecystitis with potential rupture of the gallbladder into the gallbladder fossa with small adjacent fluid collection. This shows mixed signal intensity with some suggestion of blood. Correlate with any attempted percutaneous gallbladder access. 4. Signs of pancreatitis with presumed fat necrosis or small phlegmon near the pancreatic head, attention on follow-up. 5. Signs of pancreatic stent not as well seen as on previous CT study. 6. Hepatic steatosis. 7. Mildly enlarged lymph nodes near the porta hepatis in the setting of active inflammation, likely reactive. Electronically Signed: By: Zetta Bills M.D. On: 05/30/2019 13:41   US Abdomen Limited RUQ  Result Date: 05/30/2019 CLINICAL DATA:  Right upper quadrant pain EXAM: ULTRASOUND ABDOMEN LIMITED RIGHT UPPER QUADRANT COMPARISON:  CT 09/07/2017 FINDINGS: Gallbladder: Small stones measuring up to 4 mm. Negative sonographic Murphy. Increased wall thickness up to 4.7 mm. Positive for  pericholecystic fluid. Common bile duct: Diameter: 7.2 mm. Partially visualized drainage catheter within the common bile duct. Common bile duct wall appears slightly thickened. Liver: No focal lesion identified. Within normal limits in parenchymal echogenicity. Portal vein is patent on color Doppler imaging with normal direction of blood flow towards the liver. Other: None. IMPRESSION: 1. Small gallstones. Increased wall thickness with small amount of pericholecystic fluid but negative sonographic Percell Miller, findings could be secondary to acute or ongoing cholecystitis. 2. No drainage catheter visible sonography at the gallbladder fossa. A portion of a drainage catheter is visible within the common bile duct. Mild wall thickening of the common bile duct possibly due to inflammation or infection. Electronically Signed   By: Donavan Foil M.D.   On: 05/30/2019 01:13    Anti-infectives: Anti-infectives (From admission, onward)   Start     Dose/Rate Route Frequency Ordered Stop   05/30/19 2359  piperacillin-tazobactam (ZOSYN) IVPB 3.375 g     3.375 g 12.5 mL/hr over 240 Minutes Intravenous Every 8 hours 05/30/19 1616     05/30/19 1700  piperacillin-tazobactam (ZOSYN) IVPB 3.375 g     3.375 g 100 mL/hr over 30 Minutes Intravenous  Once 05/30/19 1644 05/30/19 1727   05/30/19 1630  piperacillin-tazobactam (ZOSYN) IVPB 3.375 g  Status:  Discontinued     3.375 g 100 mL/hr over 30 Minutes Intravenous  Once 05/30/19 1616 05/30/19 1645      Assessment/Plan: Choledocholithiasis with biliary drain Possible cholecystitis ? Liver abscess -no current indication for surgery -discussed with IR who will attempt to drain  liver abscess/dx and inject drain today -will ask gi to see also as she is going to stay here for care. I discussed with Dr Lavon Paganini.  At some point needs ercp to clear duct and to get perc tube out, this likely will facilitate the ercp -eventually needs cholecystectomy once this is all resolved in  4-8 weeks -will follow while she is here and I will see her once discharged   Emelia Loron 05/31/2019

## 2019-05-31 NOTE — Procedures (Signed)
Pre procedural Dx: Choledocholithiasis, post biliary drain placement Post procedural Dx: Same  Appropriately positioned and functioning percutaneous biliary drainage catheter. No exchange performed.  Cholangiogram demonstrates patency of the cystic duct with opacification of the neck of the gallbladder.   EBL: Minimal Complications: None immediate  Katherina Right, MD Pager #: [phone]

## 2019-05-31 NOTE — Discharge Summary (Signed)
Discharge Summary  Stacy Mills ATF:[id number] DOB: 06/07/1995  PCP: Stacy Mills  Admit date: 05/30/2019 Discharge date: 05/31/2019  Time spent: 35 mins  Recommendations for Outpatient Follow-up:  1. PCP in 1 week 2. Mills as scheduled for follow-up 3. General surgery  Discharge Diagnoses:  Active Hospital Problems   Diagnosis Date Noted  . Liver abscess 05/30/2019  . Severe obesity (BMI >= 40) (HCC) 08/26/2012    Resolved Hospital Problems  No resolved problems to display.    Discharge Condition: Stable  Diet recommendation: Heart healthy  Vitals:   05/31/19 1338 05/31/19 1500  BP: 117/81   Pulse: 81   Resp: 17   Temp: 98.5 F (36.9 C)   SpO2: 98% 93%    History of present illness:  Stacy Halstonis a 23 y.o.femalewith medical history significant forobesity, cholelithiasis, anxiety/depression, presents to the ED complaining of worsening abdominal pain for the past few days. History gotten from chart review, patient as well as her mother. Of note, patient initially started having some epigastric pain about 2 to 3 weeks ago, was found to have gallstones, moved [place] the pain wors[place], [facility]olithiasis with an unsuccessful ERCP. A biliary drain was subsequently placed,and patient hada follow-up appointment for further management. Patient subsequently reported worsening sharp RUQabdominal pain,was when she takes deep breaths in,denies any radiation. Patient denies any nausea/vomiting, diarrhea, chest pain, shortness of breath, cough, fever/chills. Of note, patient was diagnosed with Covid infection on 02/2019, did not require any hospitalization. [place] and would wants to continue care here as [place] and wants to assist intaking care of patient. In the ED, patient's vital signs somewhat stable, labs  showed elevated LFTs, with mild leukocytosis. CT abdomen showed low-density lesion in the posterior right lobe of the liver,recommended MRI. MRI of the liver showed possible small abscess in the liver, signs of cholecystitis with potential rupture of the gallbladder, possible signs of pancreatitis, hepatic steatosis.EDP consulted general surgery, who then consulted IR. [facility] hospitalist consulted for admission as general surgery stated they would not be operating.     Today, patient denies any new complaints, denies any worsening abdominal pain or pain at the drain sites, no nausea/vomiting, denies any chest pain, shortness of breath, fever/chills.  Discussed with patient and mother, patient has an appointment on 06/04/2019 in [place] with the gastroenterologist, which they plan to follow-up with for now.  Advised that if they are not able to make that appointment back in [place], they should contact Stacy Mills office for an appointment.  Patient would also possibly need a cholecystectomy in the next couple of weeks, may follow-up in [place] or contact Stacy Mills for follow-up if still here in [place].     Hospital Course:  Principal Problem:   Liver abscess Active Problems:   Severe obesity (BMI >= 40) (HCC)   Likely choledocholithiasis status post biliary drain/possible cholecystitis Currently afebrile, with resolved leukocytosis History of ERCP at an outside hospital a couple of weeks ago CT abdomen showed low-density lesion in the posterior right lobe of the liver, recommended MRI MRI of the liver showed possible small abscess in the liver, signs of cholecystitis with potential rupture of the gallbladder, possible signs of pancreatitis, hepatic steatosis General surgery consulted, recommend cholecystectomy in about 4 to 8 weeks Mills on board, plan for follow-up ERCP either in [place] as scheduled  or here in [place] IR on board, status post cholangiogram via existing  biliary drainage catheter which showed appropriate positioned and functioning percutaneous biliary drainage catheter, showed patency of the cystic duct with opacification of the neck of the gallbladder Discharged on p.o. Augmentin for a total of 7 days  Likely liver hematoma, unlikely abscess MRI of liver as above, likely more of a hematoma as per IR and general surgery Follow-up with Mills  AKI Patient with multiple imagings requiring contrast within the past few days S/P IV hydration, encouraged to drink orally Follow-up with PCP with repeat labs  Transaminitis Improving Likely due to above Follow-up with PCP/Mills  History of COVID-19 infection Patient was diagnosed on 03/19/2019, repeat Covid negative Currently asymptomatic, never required hospitalization  Morbid obesity Lifestyle modification advised           Malnutrition Type:      Malnutrition Characteristics:      Nutrition Interventions:      Estimated body mass index is 44.62 kg/m as calculated from the following:   Height as of this encounter: 5\' 6"  (1.676 m).   Weight as of this encounter: 125.4 kg.    Procedures:  Cholangiogram on 05/31/2019  Consultations:  General surgery  Mills  IR  Discharge Exam: BP 117/81 (BP Location: Left Arm)   Pulse 81   Temp 98.5 F (36.9 C) (Oral)   Resp 17   Ht 5\' 6"  (1.676 m)   Wt 125.4 kg   SpO2 93%   BMI 44.62 kg/m   General: NAD Cardiovascular: S1, S2 present Respiratory: CTA B Abdomen: Soft, nontender, nondistended, bowel sounds present, right-sided drain noted  Discharge Instructions You were cared for by a hospitalist during your hospital stay. If you have any questions about your discharge medications or the care you received while you were in the hospital after you are discharged, you can call the unit and asked to speak with the hospitalist on call if the hospitalist that took care of you is not available. Once you are discharged, your  primary care physician will handle any further medical issues. Please note that NO REFILLS for any discharge medications will be authorized once you are discharged, as it is imperative that you return to your primary care physician (or establish a relationship with a primary care physician if you do not have one) for your aftercare needs so that they can reassess your need for medications and monitor your lab values.  Discharge Instructions    Diet - low sodium heart healthy   Complete by: As directed    Increase activity slowly   Complete by: As directed      Allergies as of 05/31/2019      Reactions   Ivp Dye [iodinated Diagnostic Agents] Itching, Nausea Only   Contrast for MRI      Medication List    STOP taking these medications   ibuprofen 600 MG tablet Commonly known as: ADVIL     TAKE these medications   acetaminophen 325 MG tablet Commonly known as: TYLENOL Take 650 mg by mouth every 6 (six) hours as needed for mild pain or moderate pain.   amoxicillin-clavulanate 875-125 MG tablet Commonly known as: Augmentin Take 1 tablet by mouth 2 (two) times daily for 6 days.   baclofen 10 MG tablet Commonly known as: LIORESAL Take 10 mg by mouth 2 (two) times daily as needed for pain.   docusate sodium 100 MG capsule Commonly known as: COLACE Take 100 mg by mouth  2 (two) times daily as needed for constipation.   Eucrisa 2 % Oint Generic drug: Crisaborole Apply 1 application topically daily as needed (eczema).   levonorgestrel 20 MCG/24HR IUD Commonly known as: MIRENA 1 each by Intrauterine route once.   oxyCODONE-acetaminophen 5-325 MG tablet Commonly known as: PERCOCET/ROXICET Take 1-2 tablets by mouth every 4 (four) hours as needed for pain.      Allergies  Allergen Reactions  . Ivp Dye [Iodinated Diagnostic Agents] Itching and Nausea Only    Contrast for MRI   Follow-up Information    Emelia Loron, MD Follow up in 3 week(s).   Specialty: General  Surgery Contact information: [address] [place] [address] [phone]        Mansouraty, Netty Starring., MD. Schedule an appointment as soon as possible for a visit.   Specialties: Gastroenterology, Internal Medicine Why: To follow up on your ERCP Contact information: [address] [place] [address] [phone]        Nche, Bonna Gains, Mills. Schedule an appointment as soon as possible for a visit in 1 week(s).   Specialty: Internal Medicine Contact information: [address] [place] [address] [phone]            The results of significant diagnostics from this hospitalization (including imaging, microbiology, ancillary and laboratory) are listed below for reference.    Significant Diagnostic Studies: DG Chest 2 View  Result Date: 05/30/2019 CLINICAL DATA:  Pressure at site of prior gallbladder drain EXAM: CHEST - 2 VIEW COMPARISON:  None. FINDINGS: Low lung volumes. No significant pleural effusion. No consolidation. Normal heart size. No pneumothorax. Drainage catheter in the right upper quadrant. IMPRESSION: No active cardiopulmonary disease.  Low lung volumes. Electronically Signed   By: Jasmine Pang M.D.   On: 05/30/2019 01:09   CT ABDOMEN PELVIS W CONTRAST  Result Date: 05/30/2019 CLINICAL DATA:  Abdominal abscess/infection suspected Patient reports drain placed in gallbladder 2 weeks ago at an outside institution. Pain at site of drainage catheter. EXAM: CT ABDOMEN AND PELVIS WITH CONTRAST TECHNIQUE: Multidetector CT imaging of the abdomen and pelvis was performed using the standard protocol following bolus administration of intravenous contrast. Listed history of IVP dye allergy, patient reports allergy is to MRI contrast. No reported contrast reaction. CONTRAST:  OMNIPAQUE IOHEXOL 300 MG/ML  SOLN COMPARISON:  Right upper quadrant ultrasound earlier this day. Abdominal CT 09/07/2017. FINDINGS: Lower chest: Linear atelectasis in both  lower lobes. No pleural fluid. Hepatobiliary: Percutaneous biliary catheter extends through the right lobe of the liver into the common bile duct, tip in the duodenum. There is no intra or extrahepatic biliary ductal dilatation. No evidence of stones along the biliary catheter or in the common bile duct. No abnormality along the subcutaneous course of the catheter, not entirely included in the field of view. There is an additional catheter within the duodenum. Gallstones within partially distended gallbladder. Gallbladder wall thickening versus pericholecystic fluid, fluid demonstrated on recent ultrasound. There is a 3.3 x 2.2 x 4.0 cm low-density lesion in the posterior right lobe of the liver, series 2, image 18. Single focus of air in the right biliary tree. No discrete perihepatic fluid collection. Pancreas: No evidence of peripancreatic inflammation. No pancreatic ductal dilatation. Spleen: Normal in size without focal abnormality. Adrenals/Urinary Tract: Normal adrenal glands. No hydronephrosis or perinephric edema. Homogeneous renal enhancement. Urinary bladder is physiologically distended without wall thickening. Stomach/Bowel: Stomach is within normal limits. Appendix appears normal. No evidence of  bowel wall thickening, distention, or inflammatory changes. Question of duodenal diverticulum adjacent to the fourth portion. Moderate colonic stool burden. Vascular/Lymphatic: Abdominal aorta is normal in caliber. The portal vein is patent. No significant adenopathy. Reproductive: IUD in the uterus, possibly low in positioning. No adnexal mass. Other: No ascites or free fluid. No free air in the abdomen. No evidence of intra-abdominal abscess. Small fat containing umbilical hernia. Musculoskeletal: There are no acute or suspicious osseous abnormalities. IMPRESSION: 1. Percutaneous biliary catheter extending through the right lobe of the liver into the common bile duct, tip in the duodenum. No evidence of stones  along the biliary catheter or in the common bile duct. No biliary dilatation. There is an additional thin catheter in the duodenum. 2. Partially distended gallbladder with gallstones. Gallbladder wall thickening versus pericholecystic fluid, fluid was seen on recent ultrasound. 3. Low-density lesion in the posterior right lobe of the liver measuring 3.3 x 2.2 x 4.0 cm. This is new from 2019 CT, differential considerations include hepatic lesion versus abscess. Consider further evaluation with MRI, this is not seen on ultrasound. 4. Moderate colonic stool burden, can be seen with constipation. Electronically Signed   By: Narda Rutherford M.D.   On: 05/30/2019 05:58   MR LIVER W WO CONTRAST  Addendum Date: 05/31/2019   ADDENDUM REPORT: 05/31/2019 01:40 ADDENDUM: Note that the previously described ovoid lesion in the right hepatic lobe shows some T1 hyperintensity around the periphery. This may be proteinaceous debris though small hematoma could have a similar appearance. Central portion does not show the same hyperintensity, perhaps this represents a collection, biloma or abscess, that was previously larger and collapsed over time, now with hemorrhagic or proteinaceous debris. This would be an unusual site for hematoma related to biliary drain placement. Subtraction imaging shows little or no enhancement in the area and with some hyperemia around the lesion on the arterial phase. This is not changed in the short interval since the CT and shows no additional concerning features. Assessment of previous imaging from 2019 shows no definite lesion in this location. Findings were called to the clinical team as outlined in the Z vision dash board prior to dictation of this addendum. Electronically Signed   By: Donzetta Kohut M.D.   On: 05/31/2019 01:40   Result Date: 05/31/2019 CLINICAL DATA:  Abdominal abscess/infection suspected, history of biliary drain and history of cholecystitis. EXAM: MRI ABDOMEN WITHOUT AND WITH  CONTRAST TECHNIQUE: Multiplanar multisequence MR imaging of the abdomen was performed both before and after the administration of intravenous contrast. CONTRAST:  45mL GADAVIST GADOBUTROL 1 MMOL/ML IV SOLN COMPARISON:  CT 05/30/2019 FINDINGS: Lower chest: Incidental imaging of the lung bases is is unremarkable by MRI, limited assessment. Is Hepatobiliary: Signs of hepatic steatosis. Transhepatic percutaneous biliary drainage catheter entering via right-sided approach. Suggestion of debris and or small stones in the distal common bile duct about the catheter though small locules of air could potentially have a similar appearance. Study was not performed as an MRCP and there is evidence of pneumobilia on the current study. Is In the posterior right hemi liver, hepatic subsegment VII is a ovoid 2.9 x 1.9 fluid collection with peripheral enhancement and surrounding edema. Is There is a small pocket of fluid tracking along the interface between the gallbladder and the liver. This measures approximately 1 cm in greatest thickness and extends along the mid gallbladder to the gallbladder fundus showing mixed signal. There is distortion of the gallbladder wall and thickening with hyperenhancement of the  gallbladder wall. Sludge and at least 1 stone present in the gallbladder lumen. Small stones layer in the cystic duct as well. Pancreas: Pancreas is normal except for an area adjacent to the head of the pancreas and duodenum that measures approximately 1.8 x 1.4 cm best seen on image 68 of postcontrast images at the root of the small bowel mesentery also seen on previous CT. The pancreatic stent which is suggested on the prior imaging evaluation is not as well seen on the current evaluation. Stranding is noted about the pancreatic head. Spleen:  Spleen is normal. Adrenals/Urinary Tract:  Adrenal glands are normal. The kidneys with smooth contours and no signs of hydronephrosis. Stomach/Bowel: Limited assessment of bowel on  MRI without signs of acute gastrointestinal process. Vascular/Lymphatic: Vascular structures in the abdomen are patent including the portal vein. No signs of adenopathy. Mildly enlarged lymph nodes near the porta hepatis in the setting of a active inflammation. Other:  No ascites. Musculoskeletal: No acute bone process. IMPRESSION: 1. Transhepatic percutaneous biliary drainage catheter entering via right-sided approach. Pneumobilia also with suggestion of debris and/or small stones along the course of the catheter in the distal common bile duct. MRCP was not performed. 2. Small abscess or biloma in the right posterior hemi liver. Follow-up to ensure resolution. 3. Signs of cholecystitis with potential rupture of the gallbladder into the gallbladder fossa with small adjacent fluid collection. This shows mixed signal intensity with some suggestion of blood. Correlate with any attempted percutaneous gallbladder access. 4. Signs of pancreatitis with presumed fat necrosis or small phlegmon near the pancreatic head, attention on follow-up. 5. Signs of pancreatic stent not as well seen as on previous CT study. 6. Hepatic steatosis. 7. Mildly enlarged lymph nodes near the porta hepatis in the setting of active inflammation, likely reactive. Electronically Signed: By: Donzetta KohutGeoffrey  Wile M.D. On: 05/30/2019 13:41   DG Cm Inj Any Colonic Tube W/Fluoro  Result Date: 05/31/2019 INDICATION: History of choledocholithiasis with failed ERCP and subsequent percutaneous transhepatic biliary drainage catheter placement all of which were performed at an outside institution. Patient presented to the hospital with right upper quadrant abdominal pain. Request now made for percutaneous cholangiogram via existing biliary drainage catheter to both evaluate positioning and functionality of the biliary drainage catheter as well as to determine the patency of the cystic duct EXAM: CHOLANGIOGRAM VIA EXISTING BILIARY DRAINAGE CATHETER COMPARISON:   COMPARISON MRCP; CT abdomen and pelvis - 05/30/2019 CONTRAST:  20 cc mL Omnipaque-300 administered via the existing biliary drainage catheter FLUOROSCOPY TIME:  42 seconds (2.12 mGy) COMPLICATIONS: None immediate. TECHNIQUE: Patient was positioned supine on the fluoroscopy table A preprocedural spot fluoroscopic image was obtained of right upper abdominal quadrant existing percutaneous biliary drainage catheter. Multiple spot fluoroscopic images were obtained following the administration of small amount contrast via the existing biliary drainage catheter. Images were reviewed and discussed the patient. The biliary drainage catheter was flushed with a small amount of saline and reconnected to a gravity bag. Patient tolerated the procedure well without immediate postprocedural complication. FINDINGS: Preprocedural spot fluoroscopic image demonstrates expected positioning of the biliary drainage catheter end overlying the expected location of the duodenum and radiopaque marker overlying the expected location of the central aspect of the right biliary hilum. Contrast injection confirmed appropriate positioning and functionality of the biliary drainage catheter with passage of contrast both through and around the drainage catheter to the level of the duodenum. Subsequent images demonstrate opacification of the cystic duct as well as the neck of  the gallbladder. IMPRESSION: 1. Appropriately positioned and functioning percutaneous biliary drainage catheter. 2. Cholangiogram demonstrates patency of the cystic duct with opacification of the neck of the gallbladder. Electronically Signed   By: Sandi Mariscal M.D.   On: 05/31/2019 11:11   US Abdomen Limited RUQ  Result Date: 05/30/2019 CLINICAL DATA:  Right upper quadrant pain EXAM: ULTRASOUND ABDOMEN LIMITED RIGHT UPPER QUADRANT COMPARISON:  CT 09/07/2017 FINDINGS: Gallbladder: Small stones measuring up to 4 mm. Negative sonographic Murphy. Increased wall thickness up to  4.7 mm. Positive for pericholecystic fluid. Common bile duct: Diameter: 7.2 mm. Partially visualized drainage catheter within the common bile duct. Common bile duct wall appears slightly thickened. Liver: No focal lesion identified. Within normal limits in parenchymal echogenicity. Portal vein is patent on color Doppler imaging with normal direction of blood flow towards the liver. Other: None. IMPRESSION: 1. Small gallstones. Increased wall thickness with small amount of pericholecystic fluid but negative sonographic Percell Miller, findings could be secondary to acute or ongoing cholecystitis. 2. No drainage catheter visible sonography at the gallbladder fossa. A portion of a drainage catheter is visible within the common bile duct. Mild wall thickening of the common bile duct possibly due to inflammation or infection. Electronically Signed   By: Donavan Foil M.D.   On: 05/30/2019 01:13    Microbiology: Recent Results (from the past 240 hour(s))  SARS CORONAVIRUS 2 (TAT 6-24 HRS) Nasopharyngeal Nasopharyngeal Swab     Status: None   Collection Time: 05/30/19  3:55 PM   Specimen: Nasopharyngeal Swab  Result Value Ref Range Status   SARS Coronavirus 2 NEGATIVE NEGATIVE Final    Comment: (NOTE) SARS-CoV-2 target nucleic acids are NOT DETECTED. The SARS-CoV-2 RNA is generally detectable in upper and lower respiratory specimens during the acute phase of infection. Negative results do not preclude SARS-CoV-2 infection, do not rule out co-infections with other pathogens, and should not be used as the sole basis for treatment or other patient management decisions. Negative results must be combined with clinical observations, patient history, and epidemiological information. The expected result is Negative. Fact Sheet for Patients: SugarRoll.be Fact Sheet for Healthcare Providers: https://www.woods-mathews.com/ This test is not yet approved or cleared by the [place] FDA and  has been authorized for detection and/or diagnosis of SARS-CoV-2 by FDA under an Emergency Use Authorization (EUA). This EUA will remain  in effect (meaning this test can be used) for the duration of the COVID-19 declaration under Section 56 4(b)(1) of the Act, 21 U.S.C. section 360bbb-3(b)(1), unless the authorization is terminated or revoked sooner. Performed at [facility], [place] [address], [place], [place] [address]      Labs: Basic Metabolic Panel: Recent Labs  Lab 05/30/19 0103 05/31/19 0417  NA 140 142  K 3.8 4.0  CL 109 108  CO2 22 25  GLUCOSE 103* 101*  BUN 9 10  CREATININE 0.89 1.10*  CALCIUM 9.3 9.2   Liver Function Tests: Recent Labs  Lab 05/30/19 0103 05/31/19 0417  AST 135* 35  ALT 92* 62*  ALKPHOS 129* 95  BILITOT 0.4 0.6  PROT 7.7 7.1  ALBUMIN 3.6 3.3*   Recent Labs  Lab 05/30/19 0103  LIPASE 45   No results for input(s): AMMONIA in the last 168 hours. CBC: Recent Labs  Lab 05/30/19 0103 05/31/19 0417  WBC 11.1* 8.6  NEUTROABS 5.6  --   HGB 12.9 11.9*  HCT 41.0 39.0  MCV 87.8 89.2  PLT 552* 450*   Cardiac Enzymes:  No results for input(s): CKTOTAL, CKMB, CKMBINDEX, TROPONINI in the last 168 hours. BNP: BNP (last 3 results) No results for input(s): BNP in the last 8760 hours.  ProBNP (last 3 results) No results for input(s): PROBNP in the last 8760 hours.  CBG: No results for input(s): GLUCAP in the last 168 hours.     Signed:  Briant Cedar, MD [facility] 05/31/2019, 4:15 PM

## 2019-06-03 ENCOUNTER — Telehealth: Payer: Self-pay | Admitting: *Deleted

## 2019-06-03 ENCOUNTER — Other Ambulatory Visit: Payer: Self-pay

## 2019-06-03 ENCOUNTER — Telehealth: Payer: Self-pay

## 2019-06-03 DIAGNOSIS — R932 Abnormal findings on diagnostic imaging of liver and biliary tract: Secondary | ICD-10-CM

## 2019-06-03 NOTE — Telephone Encounter (Signed)
Dr Meridee Score the pt called to say she is having her procedure in [place] tomorrow.   I cancelled her case here.

## 2019-06-03 NOTE — Telephone Encounter (Signed)
-----   Message from Lemar Lofty., MD sent at 06/01/2019  3:10 PM EST ----- Tyrone Nine, I think it is reasonable to just pursue an ERCP for you and Dr. Dwain Sarna. They make mention of some debris alongside the drain in CBD. I would normally wait at least 4-weeks since the insertion of the biliary drain to minimize risk of fistula/drainage from the PBD site so that would be no sooner than January 13th.  Dillyn Menna, please reach out to the patient this week and let's get her scheduled for an ERCP after January 13th.  I think I have spots on the 14th as well. I do not need to see her in clinic, unless she wants to discuss, since we now have the PBD in place so attempt at ERCP should be more feasible, though never guaranteed. If she wants a clinic visit, then we will just need to double book her. Dr. Lavon Paganini will remain her primary GI moving forward for our group. Thanks.  Gabe ----- Message ----- From: Napoleon Form, MD Sent: 05/31/2019  12:24 PM EST To: Loretha Stapler, RN, Lemar Lofty., MD, #  Gabe,  Dr Dwain Sarna requested [place] to see this patient, cholangiogram didn't show any large CBD stone. She has biliary drain in place. Can you please get her in for office visit and evaluate if she will need ERCP? Thanks VN

## 2019-06-03 NOTE — Telephone Encounter (Signed)
Thank you Patty for the update. Dr. Dwain Sarna and Dr. Lavon Paganini - FYI about your patient. GM

## 2019-06-03 NOTE — Telephone Encounter (Signed)
The pt has been scheduled for 06/13/19 and COVID testing on 06/10/19.  Left message on machine to call back

## 2019-06-03 NOTE — Telephone Encounter (Signed)
Transition Care Management Follow-up Telephone Call   Date discharged? 06/09/19   How have you been since you were released from the hospital? Doing well.    Do you understand why you were in the hospital? yes   Do you understand the discharge instructions? yes   Where were you discharged to? In [place] w/ family   Items Reviewed:  Medications reviewed: yes, "they just added amoxicillin"  Allergies reviewed: yes  Dietary changes reviewed: yes  Referrals reviewed: yes   Functional Questionnaire:   Activities of Daily Living (ADLs):   She states they are independent in the following: ambulation, bathing and hygiene, feeding, continence, grooming, toileting and dressing States they require assistance with the following: na   Any transportation issues/concerns?: no   Any patient concerns? no   Pt states she is currently in [place] w/ family. Pt is having procedure there tomorrow. Pt will call at a later time to schedule appt.   Confirmed with patient if condition begins to worsen call PCP or go to the ER.  Patient was given the office number and encouraged to call back with question or concerns.  : yes

## 2019-06-10 ENCOUNTER — Other Ambulatory Visit (HOSPITAL_COMMUNITY): Payer: 59

## 2019-06-13 ENCOUNTER — Ambulatory Visit (HOSPITAL_COMMUNITY): Admit: 2019-06-13 | Payer: 59 | Admitting: Gastroenterology

## 2019-06-13 ENCOUNTER — Encounter (HOSPITAL_COMMUNITY): Payer: Self-pay

## 2019-06-13 SURGERY — ENDOSCOPIC RETROGRADE CHOLANGIOPANCREATOGRAPHY (ERCP) WITH PROPOFOL
Anesthesia: General

## 2019-08-31 ENCOUNTER — Ambulatory Visit: Payer: 59 | Attending: Internal Medicine

## 2019-08-31 DIAGNOSIS — Z23 Encounter for immunization: Secondary | ICD-10-CM

## 2019-08-31 NOTE — Progress Notes (Signed)
   Covid-19 Vaccination Clinic  Name:  Stacy Mills    MRN: [id number] DOB: 1995-08-29  08/31/2019  [name] was observed post Covid-19 immunization for 30 minutes based on pre-vaccination screening without incident. She was provided with Vaccine Information Sheet and instruction to access the V-Safe system.   [name] was instructed to call 911 with any severe reactions post vaccine: Marland Kitchen Difficulty breathing  . Swelling of face and throat  . A fast heartbeat  . A bad rash all over body  . Dizziness and weakness   Immunizations Administered    Name Date Dose VIS Date Route   Pfizer COVID-19 Vaccine 08/31/2019  2:05 PM 0.3 mL 05/10/2019 Intramuscular   Manufacturer: [employer], [employer]   Lot: [id number]   NDC: [address]-9

## 2019-09-24 ENCOUNTER — Ambulatory Visit: Payer: 59 | Attending: Internal Medicine

## 2020-02-04 DIAGNOSIS — Z3040 Encounter for surveillance of contraceptives, unspecified: Secondary | ICD-10-CM | POA: Diagnosis not present

## 2020-02-16 DIAGNOSIS — H5213 Myopia, bilateral: Secondary | ICD-10-CM | POA: Diagnosis not present

## 2020-04-30 DIAGNOSIS — Z03818 Encounter for observation for suspected exposure to other biological agents ruled out: Secondary | ICD-10-CM | POA: Diagnosis not present

## 2020-04-30 DIAGNOSIS — Z20822 Contact with and (suspected) exposure to covid-19: Secondary | ICD-10-CM | POA: Diagnosis not present

## 2020-12-03 ENCOUNTER — Ambulatory Visit
Admission: EM | Admit: 2020-12-03 | Discharge: 2020-12-03 | Disposition: A | Discharge status: Home / Self Care | Payer: Medicaid Other | Attending: Physician Assistant | Admitting: Physician Assistant

## 2020-12-03 ENCOUNTER — Other Ambulatory Visit: Payer: Self-pay

## 2020-12-03 DIAGNOSIS — J0100 Acute maxillary sinusitis, unspecified: Secondary | ICD-10-CM | POA: Diagnosis not present

## 2020-12-03 DIAGNOSIS — J302 Other seasonal allergic rhinitis: Secondary | ICD-10-CM | POA: Diagnosis not present

## 2020-12-03 DIAGNOSIS — R0981 Nasal congestion: Secondary | ICD-10-CM

## 2020-12-03 DIAGNOSIS — Z20822 Contact with and (suspected) exposure to covid-19: Secondary | ICD-10-CM | POA: Diagnosis not present

## 2020-12-03 MED ORDER — AMOXICILLIN-POT CLAVULANATE 875-125 MG PO TABS: 1.0000 | ORAL_TABLET | Freq: Two times a day (BID) | ORAL | 0 refills | Status: DC

## 2020-12-03 MED ORDER — TRIAMCINOLONE ACETONIDE 40 MG/ML IJ SUSP: 40.0000 mg | Freq: Once | INTRAMUSCULAR | Status: DC

## 2020-12-03 MED ORDER — METHYLPREDNISOLONE ACETATE 40 MG/ML IJ SUSP
40.0000 mg | Freq: Once | INTRAMUSCULAR | Status: AC
  Administered 2020-12-03: 40 mg via INTRAMUSCULAR

## 2020-12-03 NOTE — ED Provider Notes (Addendum)
[facility]    CSN: [id number] Arrival date & time: 12/03/20  1300      History   Chief Complaint Chief Complaint  Patient presents with   Nasal Congestion   itchy eyes    HPI Stacy Mills is a 25 y.o. female.   Patient presents today with a several month history of ongoing allergy symptoms that have worsened significantly in the past 2 weeks.  She reports nasal congestion, sinus pressure, cough.  She denies any chest pain, shortness of breath, nausea, vomiting, fever.  Denies any known sick contacts.  She has been taking Zyrtec as prescribed without improvement of symptoms.  She denies any recent antibiotic use.  She is up-to-date on COVID-19 vaccination but has not had influenza vaccine.  She denies history of asthma, COPD, smoking.  She denies history of diabetes.  She has no concern for pregnancy.   Past Medical History:  Diagnosis Date   Allergy    Anxiety    Depression    HSV (herpes simplex virus) anogenital infection    IIH (idiopathic intracranial hypertension)    IUD (intrauterine device) in place    Paraguard placed 2014, removed 10/2015   Migraine    Pelvic inflammatory disease 10/2012    Patient Active Problem List   Diagnosis Date Noted   Liver abscess 05/30/2019   Obesity complicating pregnancy in third trimester 08/19/2018   Expectant parent prebirth pediatrician visit 06/12/2018   Gonorrhea affecting pregnancy 02/05/2018   Idiopathic intracranial hypertension    Migraines 12/22/2014   PID (acute pelvic inflammatory disease) 11/07/2012   Hemorrhagic cyst of ovary 11/07/2012   Severe obesity (BMI >= 40) (HCC) 08/26/2012    Past Surgical History:  Procedure Laterality Date   CESAREAN SECTION N/A 08/19/2018   Procedure: CESAREAN SECTION;  Surgeon: Jaymes Graff, MD;  Location: [facility] LD ORS;  Service: Obstetrics;  Laterality: N/A;   spinal tap      OB History     Gravida  1   Para  1   Term  1   Preterm      AB  0   Living  1       SAB      IAB      Ectopic  0   Multiple  0   Live Births  1            Home Medications    Prior to Admission medications   Medication Sig Start Date End Date Taking? Authorizing Provider  amoxicillin-clavulanate (AUGMENTIN) 875-125 MG tablet Take 1 tablet by mouth every 12 (twelve) hours. 12/03/20  Yes Allicia Culley, Noberto Retort, PA-C  acetaminophen (TYLENOL) 325 MG tablet Take 650 mg by mouth every 6 (six) hours as needed for mild pain or moderate pain.    [provider]  Crisaborole (EUCRISA) 2 % OINT Apply 1 application topically daily as needed (eczema).     [provider]  levonorgestrel (MIRENA) 20 MCG/24HR IUD 1 each by Intrauterine route once.    [provider]  oxyCODONE-acetaminophen (PERCOCET/ROXICET) 5-325 MG tablet Take 1-2 tablets by mouth every 4 (four) hours as needed for pain. 05/20/19   [provider]    Family History Family History  Problem Relation Age of Onset   Emphysema Maternal Grandfather    Depression Maternal Grandfather    Anxiety disorder Maternal Grandfather    Mental illness Maternal Grandfather    Diabetes Maternal Grandfather    Cancer Paternal Grandmother    Non-Hodgkin's  lymphoma Paternal Grandmother    Sudden death Paternal Grandmother    Asthma Mother    Hypertension Father    Alcohol abuse Maternal Aunt    Hyperlipidemia Maternal Aunt     Social History Social History   Tobacco Use   Smoking status: Former    Pack years: 0.00   Smokeless tobacco: Never  Vaping Use   Vaping Use: Never used  Substance Use Topics   Alcohol use: Not Currently    Alcohol/week: 2.0 standard drinks    Types: 2 Shots of liquor per week    Comment: social   Drug use: Not Currently    Types: Other-see comments, Marijuana     Allergies   Ivp dye [iodinated diagnostic agents]   Review of Systems Review of Systems  Constitutional:  Positive for activity change. Negative for appetite change, fatigue and  fever.  HENT:  Positive for congestion, postnasal drip, rhinorrhea, sinus pressure and sinus pain. Negative for sneezing and sore throat.   Respiratory:  Positive for cough. Negative for shortness of breath.   Cardiovascular:  Negative for chest pain.  Gastrointestinal:  Negative for abdominal pain, diarrhea, nausea and vomiting.  Musculoskeletal:  Negative for arthralgias and myalgias.  Neurological:  Negative for dizziness, light-headedness and headaches.    Physical Exam Triage Vital Signs ED Triage Vitals  Enc Vitals Group     BP 12/03/20 1416 (!) 138/94     Pulse Rate 12/03/20 1416 77     Resp 12/03/20 1416 18     Temp 12/03/20 1416 98.2 F (36.8 C)     Temp Source 12/03/20 1416 Oral     SpO2 12/03/20 1416 98 %     Weight --      Height --      Head Circumference --      Peak Flow --      Pain Score 12/03/20 1420 0     Pain Loc --      Pain Edu? --      Excl. in GC? --    No data found.  Updated Vital Signs BP (!) 138/94 (BP Location: Left Arm)   Pulse 77   Temp 98.2 F (36.8 C) (Oral)   Resp 18   SpO2 98%   Visual Acuity Right Eye Distance:   Left Eye Distance:   Bilateral Distance:    Right Eye Near:   Left Eye Near:    Bilateral Near:     Physical Exam Vitals reviewed.  Constitutional:      General: She is awake. She is not in acute distress.    Appearance: Normal appearance. She is normal weight. She is not ill-appearing.     Comments: Very pleasant female appears stated age in no acute distress  HENT:     Head: Normocephalic and atraumatic.     Right Ear: Tympanic membrane, ear canal and external ear normal. Tympanic membrane is not erythematous or bulging.     Left Ear: Tympanic membrane, ear canal and external ear normal. Tympanic membrane is not erythematous or bulging.     Nose:     Right Sinus: Maxillary sinus tenderness present. No frontal sinus tenderness.     Left Sinus: Maxillary sinus tenderness present. No frontal sinus tenderness.      Mouth/Throat:     Pharynx: Uvula midline. Posterior oropharyngeal erythema present. No oropharyngeal exudate.     Comments: Drainage present posterior oropharynx  Cardiovascular:     Rate and Rhythm: Normal rate and  regular rhythm.     Heart sounds: Normal heart sounds, S1 normal and S2 normal. No murmur heard. Pulmonary:     Effort: Pulmonary effort is normal.     Breath sounds: Normal breath sounds. No wheezing, rhonchi or rales.     Comments: Clear to auscultation bilaterally Lymphadenopathy:     Head:     Right side of head: No submental, submandibular or tonsillar adenopathy.     Left side of head: No submental, submandibular or tonsillar adenopathy.     Cervical: No cervical adenopathy.  Psychiatric:        Behavior: Behavior is cooperative.     UC Treatments / Results  Labs (all labs ordered are listed, but only abnormal results are displayed) Labs Reviewed - No data to display  EKG   Radiology No results found.  Procedures Procedures (including critical care time)  Medications Ordered in UC Medications  methylPREDNISolone acetate (DEPO-MEDROL) injection 40 mg (has no administration in time range)    Initial Impression / Assessment and Plan / UC Course  I have reviewed the triage vital signs and the nursing notes.  Pertinent labs & imaging results that were available during my care of the patient were reviewed by me and considered in my medical decision making (see chart for details).      No indication for viral testing including influenza or COVID-19 given patient has been symptomatic for several weeks.  Patient started on Augmentin twice daily for 1 week given prolonged and worsening symptoms.  Discussed that symptoms are likely related to sinus infection that began as allergies.  She was encouraged to continue allergy regimen of Zyrtec daily and add Flonase.  She was given injection of Depo-Medrol to help manage symptoms today.  Discussed alarm symptoms that  warrant emergent evaluation.  Strict return precautions given to which patient expressed understanding.  Final Clinical Impressions(s) / UC Diagnoses   Final diagnoses:  Acute non-recurrent maxillary sinusitis  Seasonal allergies  Nasal congestion     Discharge Instructions      Start Augmentin twice a day for 7 days to cover for sinus infection.  You were given Kenalog injection (a steroid injection) to help manage her symptoms.  Continue over-the-counter medications including Zyrtec and I would add Flonase to help manage her symptoms.  Follow-up with your primary care provider as you may need to see an allergist if symptoms not improved with medication as prescribed.  If you have any worsening symptoms please return for reevaluation.     ED Prescriptions     Medication Sig Dispense Auth. Provider   amoxicillin-clavulanate (AUGMENTIN) 875-125 MG tablet Take 1 tablet by mouth every 12 (twelve) hours. 14 tablet Ericia Moxley, Noberto Retort, PA-C      PDMP not reviewed this encounter.   Jeani Hawking, PA-C 12/03/20 1440    RaspetNoberto Retort, PA-C 12/03/20 1457

## 2020-12-03 NOTE — ED Triage Notes (Signed)
Pt presents today noting difficulty maintaining allergy sxs since the spring, noting QD rhinorrhea since the spring 2022. She reports two weeks of post nasal drip and congestion at night with an onset this morning of itchy eyes. Has been intermittently taking generic Zyrtec without relief. No abdominal pain, n/v/d. No cough.

## 2020-12-03 NOTE — Discharge Instructions (Signed)
Start Augmentin twice a day for 7 days to cover for sinus infection.  You were given Kenalog injection (a steroid injection) to help manage her symptoms.  Continue over-the-counter medications including Zyrtec and I would add Flonase to help manage her symptoms.  Follow-up with your primary care provider as you may need to see an allergist if symptoms not improved with medication as prescribed.  If you have any worsening symptoms please return for reevaluation.

## 2020-12-11 ENCOUNTER — Encounter: Payer: Self-pay | Admitting: Nurse Practitioner

## 2020-12-14 ENCOUNTER — Other Ambulatory Visit: Payer: Self-pay | Admitting: Family

## 2020-12-14 MED ORDER — FLUCONAZOLE 150 MG PO TABS: 150.0000 mg | ORAL_TABLET | Freq: Once | ORAL | 0 refills | Status: AC

## 2020-12-24 ENCOUNTER — Other Ambulatory Visit (HOSPITAL_COMMUNITY)
Admission: RE | Admit: 2020-12-24 | Discharge: 2020-12-24 | Disposition: A | Discharge status: Home / Self Care | Payer: Medicaid Other | Source: Ambulatory Visit | Attending: Nurse Practitioner | Admitting: Nurse Practitioner

## 2020-12-24 ENCOUNTER — Other Ambulatory Visit: Payer: Self-pay

## 2020-12-24 ENCOUNTER — Ambulatory Visit: Payer: Medicaid Other | Admitting: Nurse Practitioner

## 2020-12-24 ENCOUNTER — Encounter: Payer: Self-pay | Admitting: Nurse Practitioner

## 2020-12-24 VITALS — BP 110/68 | HR 78 | Temp 98.1°F | Ht 66.0 in | Wt 269.6 lb

## 2020-12-24 DIAGNOSIS — N760 Acute vaginitis: Secondary | ICD-10-CM | POA: Diagnosis not present

## 2020-12-24 DIAGNOSIS — J302 Other seasonal allergic rhinitis: Secondary | ICD-10-CM | POA: Diagnosis not present

## 2020-12-24 MED ORDER — FLUTICASONE PROPIONATE 50 MCG/ACT NA SUSP: 2.0000 | Freq: Every day | NASAL | 0 refills | Status: DC

## 2020-12-24 MED ORDER — CETIRIZINE HCL 10 MG PO TABS: 10.0000 mg | ORAL_TABLET | Freq: Every day | ORAL | 0 refills | Status: DC

## 2020-12-24 NOTE — Progress Notes (Signed)
Subjective:  Patient ID: Stacy Mills, female    DOB: 10-Jul-1995  Age: 25 y.o. MRN: [id number]  CC: Acute Visit (Pt states [place] home health [place] that her allergies get very bad. Pt states two weeks ago she went to urgent care and was diagnosed with a sinus infection. )  Vaginal Itching The patient's primary symptoms include genital itching and a genital odor. The patient's pertinent negatives include no genital lesions, genital rash, missed menses, pelvic pain, vaginal bleeding or vaginal discharge. This is a recurrent problem. The current episode started 1 to 4 weeks ago. The problem occurs constantly. The problem has been unchanged. The patient is experiencing no pain. She is not pregnant. Associated symptoms include a sore throat. Pertinent negatives include no chills, dysuria, fever, flank pain, frequency, headaches, painful intercourse or urgency. Nothing aggravates the symptoms. She has tried nothing for the symptoms. She is sexually active. No, her partner does not have an STD. She uses an IUD for contraception. Her past medical history is significant for vaginosis.  Sinus Problem This is a recurrent problem. The current episode started more than 1 month ago. The problem has been waxing and waning since onset. There has been no fever. She is experiencing no pain. Associated symptoms include congestion, sinus pressure, sneezing, a sore throat and swollen glands. Pertinent negatives include no chills, coughing, diaphoresis, ear pain, headaches, hoarse voice, neck pain or shortness of breath. Past treatments include antibiotics. The treatment provided significant relief.   Reviewed past Medical, Social and Family history today.  Outpatient Medications Prior to Visit  Medication Sig Dispense Refill   acetaminophen (TYLENOL) 325 MG tablet Take 650 mg by mouth every 6 (six) hours as needed for mild pain or moderate pain.     levonorgestrel (MIRENA)  20 MCG/24HR IUD 1 each by Intrauterine route once.     amoxicillin-clavulanate (AUGMENTIN) 875-125 MG tablet Take 1 tablet by mouth every 12 (twelve) hours. (Patient not taking: Reported on 12/24/2020) 14 tablet 0   Crisaborole 2 % OINT Apply 1 application topically daily as needed (eczema).  (Patient not taking: Reported on 12/24/2020)     oxyCODONE-acetaminophen (PERCOCET/ROXICET) 5-325 MG tablet Take 1-2 tablets by mouth every 4 (four) hours as needed for pain. (Patient not taking: Reported on 12/24/2020)     No facility-administered medications prior to visit.    ROS See HPI  Objective:  BP 110/68 (BP Location: Left Arm, Patient Position: Sitting, Cuff Size: Large)   Pulse 78   Temp 98.1 F (36.7 C) (Temporal)   Ht 5\' 6"  (1.676 m)   Wt 269 lb 9.6 oz (122.3 kg)   SpO2 98%   BMI 43.51 kg/m   Physical Exam Exam conducted with a chaperone present.  Cardiovascular:     Rate and Rhythm: Normal rate.     Pulses: Normal pulses.  Pulmonary:     Effort: Pulmonary effort is normal.  Genitourinary:    General: Normal vulva.     Labia:        Right: No rash or tenderness.        Left: No rash or tenderness.      Vagina: Normal.     Cervix: Normal.     Uterus: Normal.      Adnexa: Right adnexa normal and left adnexa normal.  Lymphadenopathy:     Lower Body: No right inguinal adenopathy. No left inguinal adenopathy.  Neurological:     Mental Status:  She is alert.   Assessment & Plan:  This visit occurred during the SARS-CoV-2 public health emergency.  Safety protocols were in place, including screening questions prior to the visit, additional usage of staff PPE, and extensive cleaning of exam room while observing appropriate contact time as indicated for disinfecting solutions.   Brylei was seen today for acute visit.  Diagnoses and all orders for this visit:  Seasonal allergic rhinitis, unspecified trigger -     fluticasone (FLONASE) 50 MCG/ACT nasal spray; Place 2 sprays into  both nostrils daily. -     cetirizine (ZYRTEC) 10 MG tablet; Take 1 tablet (10 mg total) by mouth at bedtime.  Acute vaginitis -     Cervicovaginal ancillary only( [place])  Problem List Items Addressed This Visit   None Visit Diagnoses     Seasonal allergic rhinitis, unspecified trigger    -  Primary   Relevant Medications   fluticasone (FLONASE) 50 MCG/ACT nasal spray   cetirizine (ZYRTEC) 10 MG tablet   Acute vaginitis       Relevant Orders   Cervicovaginal ancillary only( [place])       Follow-up: No follow-ups on file.  Alysia Penna, NP

## 2020-12-24 NOTE — Patient Instructions (Signed)
Allergic Rhinitis, Adult  Allergic rhinitis is an allergic reaction that affects the mucous membraneinside the nose. The mucous membrane is the tissue that produces mucus. There are two types of allergic rhinitis: Seasonal. This type is also called hay fever and happens only during certain seasons. Perennial. This type can happen at any time of the year. Allergic rhinitis cannot be spread from person to person. This condition can bemild, moderate, or severe. It can develop at any age and may be outgrown. What are the causes? This condition is caused by allergens. These are things that can cause an allergic reaction. Allergens may differ for seasonal allergic rhinitis and perennial allergic rhinitis. Seasonal allergic rhinitis is triggered by pollen. Pollen can come from grasses, trees, and weeds. Perennial allergic rhinitis may be triggered by: Dust mites. Proteins in a pet's urine, saliva, or dander. Dander is dead skin cells from a pet. Smoke, mold, or car fumes. What increases the risk? You are more likely to develop this condition if you have a family history of allergies or other conditions related to allergies, including: Allergic conjunctivitis. This is inflammation of parts of the eyes and eyelids. Asthma. This condition affects the lungs and makes it hard to breathe. Atopic dermatitis or eczema. This is long term (chronic) inflammation of the skin. Food allergies. What are the signs or symptoms? Symptoms of this condition include: Sneezing or coughing. A stuffy nose (nasal congestion), itchy nose, or nasal discharge. Itchy eyes and tearing of the eyes. A feeling of mucus dripping down the back of your throat (postnasal drip). Trouble sleeping. Tiredness or fatigue. Headache. Sore throat. How is this diagnosed? This condition may be diagnosed with your symptoms, medical history, and physical exam. Your health care provider may check for related conditions, such  as: Asthma. Pink eye. This is eye inflammation caused by infection (conjunctivitis). Ear infection. Upper respiratory infection. This is an infection in the nose, throat, or upper airways. You may also have tests to find out which allergens trigger your symptoms.These may include skin tests or blood tests. How is this treated? There is no cure for this condition, but treatment can help control symptoms. Treatment may include: Taking medicines that block allergy symptoms, such as corticosteroids and antihistamines. Medicine may be given as a shot, nasal spray, or pill. Avoiding any allergens. Being exposed again and again to tiny amounts of allergens to help you build a defense against allergens (immunotherapy). This is done if other treatments have not helped. It may include: Allergy shots. These are injected medicines that have small amounts of allergen in them. Sublingual immunotherapy. This involves taking small doses of a medicine with allergen in it under your tongue. If these treatments do not work, your health care provider may prescribe newer,stronger medicines. Follow these instructions at home: Avoiding allergens Find out what you are allergic to and avoid those allergens. These are some things you can do to help avoid allergens: If you have perennial allergies: Replace carpet with wood, tile, or vinyl flooring. Carpet can trap dander and dust. Do not smoke. Do not allow smoking in your home. Change your heating and air conditioning filters at least once a month. If you have seasonal allergies, take these steps during allergy season: Keep windows closed as much as possible. Plan outdoor activities when pollen counts are lowest. Check pollen counts before you plan outdoor activities. When coming indoors, change clothing and shower before sitting on furniture or bedding. If you have a pet in the house that   produces allergens: Keep the pet out of the bedroom. Vacuum, sweep, and dust  regularly. General instructions Take over-the-counter and prescription medicines only as told by your health care provider. Drink enough fluid to keep your urine pale yellow. Keep all follow-up visits as told by your health care provider. This is important. Where to find more information American Academy of Allergy, Asthma & Immunology: www.aaaai.org Contact a health care provider if: You have a fever. You develop a cough that does not go away. You make whistling sounds when you breathe (wheeze). Your symptoms slow you down or stop you from doing your normal activities each day. Get help right away if: You have shortness of breath. This symptom may represent a serious problem that is an emergency. Do not wait to see if the symptom will go away. Get medical help right away. Call your local emergency services (911 in the [place] Do not drive yourself to the hospital. Summary Allergic rhinitis may be managed by taking medicines as directed and avoiding allergens. If you have seasonal allergies, keep windows closed as much as possible during allergy season. Contact your health care provider if you develop a fever or a cough that does not go away. This information is not intended to replace advice given to you by your health care provider. Make sure you discuss any questions you have with your healthcare provider. Document Revised: 07/05/2019 Document Reviewed: 05/14/2019 Elsevier Patient Education  2022 [employer]  

## 2020-12-25 LAB — CERVICOVAGINAL ANCILLARY ONLY
Bacterial Vaginitis (gardnerella): NEGATIVE
Candida Glabrata: NEGATIVE
Candida Vaginitis: NEGATIVE
Chlamydia: NEGATIVE
Comment: NEGATIVE
Comment: NEGATIVE
Comment: NEGATIVE
Comment: NEGATIVE
Comment: NEGATIVE
Comment: NORMAL
Neisseria Gonorrhea: NEGATIVE
Trichomonas: NEGATIVE

## 2020-12-26 ENCOUNTER — Encounter: Payer: Self-pay | Admitting: Nurse Practitioner

## 2020-12-29 ENCOUNTER — Encounter: Payer: Self-pay | Admitting: Family Medicine

## 2020-12-29 ENCOUNTER — Telehealth (INDEPENDENT_AMBULATORY_CARE_PROVIDER_SITE_OTHER): Payer: Medicaid Other | Admitting: Family Medicine

## 2020-12-29 DIAGNOSIS — U071 COVID-19: Secondary | ICD-10-CM

## 2020-12-29 MED ORDER — MOLNUPIRAVIR EUA 200MG CAPSULE: 4.0000 | ORAL_CAPSULE | Freq: Two times a day (BID) | ORAL | 0 refills | Status: AC

## 2020-12-29 NOTE — Progress Notes (Signed)
Virtual Visit via Video Note  I connected with Jaslin  on 12/29/20 at  5:00 PM EDT by a video enabled telemedicine application and verified that I am speaking with the correct person using two identifiers.  Location patient: home, [place] Location provider:work or home office Persons participating in the virtual visit: patient, provider  I discussed the limitations of evaluation and management by telemedicine and the availability of in person appointments. The patient expressed understanding and agreed to proceed.   HPI:  Acute telemedicine visit for Covid: -Onset: 3-4 days -Symptoms include:nasal congestion, sore throat, body aches, headache -doing a little better today - slight headache and nasal congestion -Denies:fever, CP, SOB, NVD, inability to eat/drink/get out of bed -Pertinent past medical history: obesity -Pertinent medication allergies:  Allergies  Allergen Reactions   Ivp Dye [Iodinated Diagnostic Agents] Itching and Nausea Only    Contrast for MRI  -denies any chance of pregnancy, has IUD, denies breast feeding -COVID-19 vaccine status: 2 doses and one booster  ROS: See pertinent positives and negatives per HPI.  Past Medical History:  Diagnosis Date   Allergy    Anxiety    Depression    HSV (herpes simplex virus) anogenital infection    IIH (idiopathic intracranial hypertension)    IUD (intrauterine device) in place    Paraguard placed 2014, removed 10/2015   Migraine    Pelvic inflammatory disease 10/2012    Past Surgical History:  Procedure Laterality Date   CESAREAN SECTION N/A 08/19/2018   Procedure: CESAREAN SECTION;  Surgeon: Jaymes Graff, MD;  Location: [facility] LD ORS;  Service: Obstetrics;  Laterality: N/A;   spinal tap       Current Outpatient Medications:    acetaminophen (TYLENOL) 325 MG tablet, Take 650 mg by mouth every 6 (six) hours as needed for mild pain or moderate pain., Disp: , Rfl:    cetirizine (ZYRTEC) 10 MG tablet, Take 1 tablet (10 mg  total) by mouth at bedtime., Disp: 30 tablet, Rfl: 0   fluticasone (FLONASE) 50 MCG/ACT nasal spray, Place 2 sprays into both nostrils daily., Disp: 16 g, Rfl: 0   levonorgestrel (MIRENA) 20 MCG/24HR IUD, 1 each by Intrauterine route once., Disp: , Rfl:    molnupiravir EUA 200 mg CAPS, Take 4 capsules (800 mg total) by mouth 2 (two) times daily for 5 days., Disp: 40 capsule, Rfl: 0  EXAM:  VITALS per patient if applicable:  GENERAL: alert, oriented, appears well and in no acute distress  HEENT: atraumatic, conjunttiva clear, no obvious abnormalities on inspection of external nose and ears  NECK: normal movements of the head and neck  LUNGS: on inspection no signs of respiratory distress, breathing rate appears normal, no obvious gross SOB, gasping or wheezing  CV: no obvious cyanosis  MS: moves all visible extremities without noticeable abnormality  PSYCH/NEURO: pleasant and cooperative, no obvious depression or anxiety, speech and thought processing grossly intact  ASSESSMENT AND PLAN:  Discussed the following assessment and plan:  COVID-19   Discussed treatment options (infusions and oral options and risk of drug interactions), ideal treatment window, potential complications, isolation and precautions for COVID-19.  Discussed possibility of rebound with antivirals and the need to reisolate if it should occur for 5 days.  After lengthy discussion, the patient opted for treatment with molnupiravir due to being higher risk for complications of covid or severe disease and other factors. Discussed EUA status of this drug and the fact that there is preliminary limited knowledge of risks/interactions/side effects per  EUA document vs possible benefits and precautions. This information was shared with patient during the visit and also was provided in patient instructions. Also, advised that patient discuss risks/interactions and use with pharmacist/treatment team as well.   Other symptomatic  care measures summarized in patient instructions. Work/School slipped offered: provided in patient instructions  Advised to seek prompt in person care if worsening, new symptoms arise, or if is not improving with treatment. Discussed options for inperson care if PCP office not available. Did let this patient know that I only do telemedicine on Tuesdays and Thursdays for [facility]. Advised to schedule follow up visit with PCP or [facility] if any further questions or concerns to avoid delays in care.   I discussed the assessment and treatment plan with the patient. The patient was provided an opportunity to ask questions and all were answered. The patient agreed with the plan and demonstrated an understanding of the instructions.     Terressa Koyanagi, DO

## 2020-12-29 NOTE — Patient Instructions (Signed)
---------------------------------------------------------------------------------------------------------------------------    WORK SLIP:  Patient Stacy Mills,  Sep 22, 1995, was seen for a medical visit today, 12/29/20 . Please excuse from work for a COVID like illness. We advise 10 days minimum from the onset of symptoms (12/27/20) PLUS 1 day of no fever and improved symptoms. Will defer to employer for a sooner return to work if symptoms have resolved, it is greater than 5 days since the positive test and the patient can wear a high-quality, tight fitting mask such as N95 or KN95 at all times for an additional 5 days. Would also suggest COVID19 antigen testing is negative prior to return.  Sincerely: E-signature: Dr. Colin Benton, DO [place] Ph: [phone]   ------------------------------------------------------------------------------------------------------------------------------   HOME CARE TIPS:  -[place] testing information: [email or web address] OR [phone] Most pharmacies also offer testing and home test kits. If the Covid19 test is positive, please make a prompt follow up visit with your primary care office or with [facility] to discuss treatment options. Treatments for Covid19 are best given early in the course of the illness.   -I sent the medication(s) we discussed to your pharmacy: Meds ordered this encounter  Medications   molnupiravir EUA 200 mg CAPS    Sig: Take 4 capsules (800 mg total) by mouth 2 (two) times daily for 5 days.    Dispense:  40 capsule    Refill:  0     -I sent in the [place] treatment or referral you requested per our discussion. Please see the information provided below and discuss further with the pharmacist/treatment team.   -If taking an antiviral, there is a chance of rebound illness after finishing your treatment. If you become sick again please isolate for  an additional 5 days.    -can use tylenol or aleve if needed for fevers, aches and pains per instructions  -can use nasal saline a few times per day if you have nasal congestion; sometimes  a short course of Afrin nasal spray for 3 days can help with symptoms as well  -stay hydrated, drink plenty of fluids and eat small healthy meals - avoid dairy  -can take 1000 IU (68mg) Vit D3 and 100-500 mg of Vit C daily per instructions  -If the Covid test is positive, check out the [facility] for more information on home care, transmission and treatment for COVID19  -follow up with your doctor in 2-3 days unless improving and feeling better  -stay home while sick, except to seek medical care. If you have COVID19, ideally it would be best to stay home for a full 10 days since the onset of symptoms PLUS one day of no fever and feeling better. Wear a good mask that fits snugly (such as N95 or KN95) if around others to reduce the risk of transmission.  It was nice to meet you today, and I really hope you are feeling better soon. I help [facility] out with telemedicine visits on Tuesdays and Thursdays and am available for visits on those days. If you have any concerns or questions following this visit please schedule a follow up visit with your Primary Care doctor or seek care at a local urgent care clinic to avoid delays in care.    Seek in person care or schedule a follow up video visit promptly if your symptoms worsen, new concerns arise or you are not improving with treatment. Call 911 and/or seek emergency care if your symptoms are severe or life threatening.  Fact Sheet for Patients And Caregivers Emergency Use Authorization (EUA) Of LAGEVRIOT (molnupiravir) capsules For Coronavirus Disease 2019 (COVID-19)  What is the most important information I should know about LAGEVRIO? LAGEVRIO may cause serious side effects, including: ? LAGEVRIO may cause harm to your unborn baby. It is not known if  LAGEVRIO will harm your baby if you take LAGEVRIO during pregnancy. o LAGEVRIO is not recommended for use in pregnancy. o LAGEVRIO has not been studied in pregnancy. LAGEVRIO was studied in pregnant animals only. When LAGEVRIO was given to pregnant animals, LAGEVRIO caused harm to their unborn babies. o You and your healthcare provider may decide that you should take LAGEVRIO during pregnancy if there are no other COVID-19 treatment options approved or authorized by the FDA that are accessible or clinically appropriate for you. o If you and your healthcare provider decide that you should take LAGEVRIO during pregnancy, you and your healthcare provider should discuss the known and potential benefits and the potential risks of taking LAGEVRIO during pregnancy. For individuals who are able to become pregnant: ? You should use a reliable method of birth control (contraception) consistently and correctly during treatment with LAGEVRIO and for 4 days after the last dose of LAGEVRIO. Talk to your healthcare provider about reliable birth control methods. ? Before starting treatment with [facility] your healthcare provider may do a pregnancy test to see if you are pregnant before starting treatment with LAGEVRIO. ? Tell your healthcare provider right away if you become pregnant or think you may be pregnant during treatment with LAGEVRIO. Pregnancy Surveillance Program: ? There is a pregnancy surveillance program for individuals who take LAGEVRIO during pregnancy. The purpose of this program is to collect information about the health of you and your baby. Talk to your healthcare provider about how to take part in this program. ? If you take LAGEVRIO during pregnancy and you agree to participate in the pregnancy surveillance program and allow your healthcare provider to share your information with [place], then your healthcare provider will report your use of [place] during pregnancy to  [place]. by calling [phone] or PeacefulBlog.es. For individuals who are sexually active with partners who are able to become pregnant: ? It is not known if LAGEVRIO can affect sperm. While the risk is regarded as low, animal studies to fully assess the potential for LAGEVRIO to affect the babies of males treated with LAGEVRIO have not been completed. A reliable method of birth control (contraception) should be used consistently and correctly during treatment with LAGEVRIO and for at least 3 months after the last dose. The risk to sperm beyond 3 months is not known. Studies to understand the risk to sperm beyond 3 months are ongoing. Talk to your healthcare provider about reliable birth control methods. Talk to your healthcare provider if you have questions or concerns about how LAGEVRIO may affect sperm. You are being given this fact sheet because your healthcare provider believes it is necessary to provide you with LAGEVRIO for the treatment of adults with mild-to-moderate coronavirus disease 2019 (COVID-19) with positive results of direct SARS-CoV-2 viral testing, and who are at high risk for progression to severe COVID-19 including hospitalization or death, and for whom other COVID-19 treatment options approved or authorized by the FDA are not accessible or clinically appropriate. The U.S. Food and Drug Administration (FDA) has issued an Emergency Use Authorization (EUA) to make LAGEVRIO available during the COVID-19 pandemic (for more details about an EUA please  see "What is an Emergency Use Authorization?" at the end of this document). LAGEVRIO is not an FDA-approved medicine in the [place]. Read this Fact Sheet for information about LAGEVRIO. Talk to your healthcare provider about your options if you have any questions. It is your choice to take LAGEVRIO.  What is COVID-19? COVID-19 is caused by a virus called a coronavirus. You can get COVID-19  through close contact with another person who has the virus. COVID-19 illnesses have ranged from very mild-to-severe, including illness resulting in death. While information so far suggests that most COVID-19 illness is mild, serious illness can happen and may cause some of your other medical conditions to become worse. Older people and people of all ages with severe, long lasting (chronic) medical conditions like heart disease, lung disease and diabetes, for example seem to be at higher risk of being hospitalized for COVID-19.  What is LAGEVRIO? LAGEVRIO is an investigational medicine used to treat mild-to-moderate COVID-19 in adults: ? with positive results of direct SARS-CoV-2 viral testing, and ? who are at high risk for progression to severe COVID-19 including hospitalization or death, and for whom other COVID-19 treatment options approved or authorized by the FDA are not accessible or clinically appropriate. The FDA has authorized the emergency use of LAGEVRIO for the treatment of mild-tomoderate COVID-19 in adults under an EUA. For more information on EUA, see the "What is an Emergency Use Authorization (EUA)?" section at the end of this Fact Sheet. LAGEVRIO is not authorized: ? for use in people less than 16 years of age. ? for prevention of COVID-19. ? for people needing hospitalization for COVID-19. ? for use for longer than 5 consecutive days.  What should I tell my healthcare provider before I take LAGEVRIO? Tell your healthcare provider if you: ? Have any allergies ? Are breastfeeding or plan to breastfeed ? Have any serious illnesses ? Are taking any medicines (prescription, over-the-counter, vitamins, or herbal products).  How do I take LAGEVRIO? ? Take LAGEVRIO exactly as your healthcare provider tells you to take it. ? Take 4 capsules of LAGEVRIO every 12 hours (for example, at 8 am and at 8 pm) ? Take LAGEVRIO for 5 days. It is important that you complete the full 5  days of treatment with LAGEVRIO. Do not stop taking LAGEVRIO before you complete the full 5 days of treatment, even if you feel better. ? Take LAGEVRIO with or without food. ? You should stay in isolation for as long as your healthcare provider tells you to. Talk to your healthcare provider if you are not sure about how to properly isolate while you have COVID-19. ? Swallow LAGEVRIO capsules whole. Do not open, break, or crush the capsules. If you cannot swallow capsules whole, tell your healthcare provider. ? What to do if you miss a dose: o If it has been less than 10 hours since the missed dose, take it as soon as you remember o If it has been more than 10 hours since the missed dose, skip the missed dose and take your dose at the next scheduled time. ? Do not double the dose of LAGEVRIO to make up for a missed dose.  What are the important possible side effects of LAGEVRIO? ? See, "What is the most important information I should know about LAGEVRIO?" ? Allergic Reactions. Allergic reactions can happen in people taking LAGEVRIO, even after only 1 dose. Stop taking LAGEVRIO and call your healthcare provider right away if you get any  of the following symptoms of an allergic reaction: o hives o rapid heartbeat o trouble swallowing or breathing o swelling of the mouth, lips, or face o throat tightness o hoarseness o skin rash The most common side effects of LAGEVRIO are: ? diarrhea ? nausea ? dizziness These are not all the possible side effects of LAGEVRIO. Not many people have taken LAGEVRIO. Serious and unexpected side effects may happen. This medicine is still being studied, so it is possible that all of the risks are not known at this time.  What other treatment choices are there?  Veklury (remdesivir) is FDA-approved as an intravenous (IV) infusion for the treatment of mildto-moderate DGLOV-56 in certain adults and children. Talk with your doctor to see if Marijean Heath  is appropriate for you. Like LAGEVRIO, FDA may also allow for the emergency use of other medicines to treat people with COVID-19. Go to LacrosseProperties.si for more information. It is your choice to be treated or not to be treated with LAGEVRIO. Should you decide not to take it, it will not change your standard medical care.  What if I am breastfeeding? Breastfeeding is not recommended during treatment with LAGEVRIO and for 4 days after the last dose of LAGEVRIO. If you are breastfeeding or plan to breastfeed, talk to your healthcare provider about your options and specific situation before taking LAGEVRIO.  How do I report side effects with LAGEVRIO? Contact your healthcare provider if you have any side effects that bother you or do not go away. Report side effects to FDA MedWatch at SmoothHits.hu or call 1-800-FDA-1088 (1- 920-274-0823).  How should I store [place]? ? Store LAGEVRIO capsules at room temperature between 57F to 38F (20C to 25C). ? Keep LAGEVRIO and all medicines out of the reach of children and pets. How can I learn more about COVID-19? ? Ask your healthcare provider. ? Visit SeekRooms.co.uk ? Contact your local or state public health department. ? Call [place] at [phone] (toll free in the [place] ? Visit [email or web address].com  What Is an Emergency Use Authorization (EUA)? The [place] FDA has made [place] available under an emergency access mechanism called an Emergency Use Authorization (EUA) The EUA is supported by a Presenter, broadcasting Health and Human Service (HHS) declaration that circumstances exist to justify emergency use of drugs and biological products during the COVID-19 pandemic. LAGEVRIO for the treatment of mild-to-moderate COVID-19 in adults with positive results of direct SARS-CoV-2 viral testing, who are at high  risk for progression to severe COVID-19, including hospitalization or death, and for whom alternative COVID-19 treatment options approved or authorized by FDA are not accessible or clinically appropriate, has not undergone the same type of review as an FDA-approved product. In issuing an EUA under the YSAYT-01 public health emergency, the FDA has determined, among other things, that based on the total amount of scientific evidence available including data from adequate and well-controlled clinical trials, if available, it is reasonable to believe that the product may be effective for diagnosing, treating, or preventing COVID-19, or a serious or life-threatening disease or condition caused by COVID-19; that the known and potential benefits of the product, when used to diagnose, treat, or prevent such disease or condition, outweigh the known and potential risks of such product; and that there are no adequate, approved, and available alternatives.  All of these criteria must be met to allow for the product to be used in the treatment of patients during the COVID-19 pandemic. The EUA for LAGEVRIO is in  effect for the duration of the COVID-19 declaration justifying emergency use of LAGEVRIO, unless terminated or revoked (after which LAGEVRIO may no longer be used under the EUA). For patent information: http://rogers.info/ Copyright  2021-2022 [place] [place], [place] and its affiliates. All rights reserved. [id number] Revised: March 2022

## 2021-01-20 ENCOUNTER — Other Ambulatory Visit: Payer: Self-pay | Admitting: Nurse Practitioner

## 2021-01-20 DIAGNOSIS — J302 Other seasonal allergic rhinitis: Secondary | ICD-10-CM

## 2021-01-22 NOTE — Telephone Encounter (Signed)
Chart supports rx refill Last ov: 12/24/2020 Last refill: 12/24/2020

## 2021-01-26 ENCOUNTER — Ambulatory Visit (INDEPENDENT_AMBULATORY_CARE_PROVIDER_SITE_OTHER): Payer: Medicaid Other | Admitting: Nurse Practitioner

## 2021-01-26 ENCOUNTER — Other Ambulatory Visit (HOSPITAL_COMMUNITY)
Admission: RE | Admit: 2021-01-26 | Discharge: 2021-01-26 | Disposition: A | Discharge status: Home / Self Care | Payer: Medicaid Other | Source: Ambulatory Visit | Attending: Nurse Practitioner | Admitting: Nurse Practitioner

## 2021-01-26 ENCOUNTER — Other Ambulatory Visit: Payer: Self-pay

## 2021-01-26 ENCOUNTER — Encounter: Payer: Self-pay | Admitting: Nurse Practitioner

## 2021-01-26 VITALS — BP 116/82 | HR 84 | Temp 97.3°F | Ht 64.25 in | Wt 263.4 lb

## 2021-01-26 DIAGNOSIS — Z113 Encounter for screening for infections with a predominantly sexual mode of transmission: Secondary | ICD-10-CM | POA: Diagnosis not present

## 2021-01-26 DIAGNOSIS — Z0000 Encounter for general adult medical examination without abnormal findings: Secondary | ICD-10-CM

## 2021-01-26 DIAGNOSIS — Z124 Encounter for screening for malignant neoplasm of cervix: Secondary | ICD-10-CM | POA: Insufficient documentation

## 2021-01-26 DIAGNOSIS — Z13220 Encounter for screening for lipoid disorders: Secondary | ICD-10-CM

## 2021-01-26 DIAGNOSIS — L83 Acanthosis nigricans: Secondary | ICD-10-CM | POA: Diagnosis not present

## 2021-01-26 DIAGNOSIS — Z136 Encounter for screening for cardiovascular disorders: Secondary | ICD-10-CM | POA: Diagnosis not present

## 2021-01-26 DIAGNOSIS — N760 Acute vaginitis: Secondary | ICD-10-CM

## 2021-01-26 DIAGNOSIS — B9689 Other specified bacterial agents as the cause of diseases classified elsewhere: Secondary | ICD-10-CM

## 2021-01-26 DIAGNOSIS — Z0001 Encounter for general adult medical examination with abnormal findings: Secondary | ICD-10-CM | POA: Diagnosis not present

## 2021-01-26 NOTE — Progress Notes (Signed)
Subjective:    Patient ID: Stacy Mills, female    DOB: [birth year], 25 y.o.   MRN: [id number]  Patient presents today for CPE  HPI Denies any acute compliant today.  Vision:up to date Dental:up to date Diet:regular Exercise:none Weight:  Wt Readings from Last 3 Encounters:  01/26/21 263 lb 6.4 oz (119.5 kg)  12/24/20 269 lb 9.6 oz (122.3 kg)  05/31/19 276 lb 7.3 oz (125.4 kg)    Sexual History (orientation,birth control, marital status, STD):IUD in place, agreed to STD screen today, agreed to pelvic and breast exam today  Depression/Suicide: Depression screen [facility] 2/9 01/26/2021 01/22/2019 06/20/2017 05/15/2017 05/15/2017 05/15/2017 04/22/2017  Decreased Interest 0 0 0 0 0 0 0  Down, Depressed, Hopeless 1 0 0 0 0 0 0  PHQ - 2 Score 1 0 0 0 0 0 0  Altered sleeping 2 - - - - - -  Tired, decreased energy 0 - - - - - -  Change in appetite 2 - - - - - -  Feeling bad or failure about yourself  0 - - - - - -  Trouble concentrating 0 - - - - - -  Moving slowly or fidgety/restless 0 - - - - - -  Suicidal thoughts 0 - - - - - -  PHQ-9 Score 5 - - - - - -  Difficult doing work/chores Not difficult at all - - - - - -   Immunizations: (TDAP, Hep C screen, Pneumovax, Influenza, zoster)  Health Maintenance  Topic Date Due   HPV Vaccine (2 - 3-dose series) 03/27/2017   Pap Smear  02/28/2020   Pap Smear  02/28/2020   Flu Shot  12/28/2020   Tetanus Vaccine  12/18/2022   COVID-19 Vaccine  Completed   Hepatitis C Screening: USPSTF Recommendation to screen - Ages 18-79 yo.  Completed   HIV Screening  Completed   Pneumococcal Vaccination  Aged Out   Fall Risk: Fall Risk  01/26/2021 01/22/2019 06/20/2017 05/15/2017 05/15/2017 05/15/2017 04/22/2017  Falls in the past year? 0 0 No No No No No  Number falls in past yr: 0 - - - - - -  Injury with Fall? 0 - - - - - -  Risk for fall due to : No Fall Risks - - - - - -  Follow up Falls evaluation completed - - - - - -   Medications and  allergies reviewed with patient and updated if appropriate.  Patient Active Problem List   Diagnosis Date Noted   Acanthosis nigricans 01/26/2021   Liver abscess 05/30/2019   Obesity complicating pregnancy in third trimester 08/19/2018   Idiopathic intracranial hypertension    Migraines 12/22/2014   Hemorrhagic cyst of ovary 11/07/2012   Morbid obesity (HCC) 08/26/2012   Current Outpatient Medications on File Prior to Visit  Medication Sig Dispense Refill   acetaminophen (TYLENOL) 325 MG tablet Take 650 mg by mouth every 6 (six) hours as needed for mild pain or moderate pain.     cetirizine (ZYRTEC) 10 MG tablet TAKE 1 TABLET BY MOUTH EVERYDAY AT BEDTIME 30 tablet 0   fluticasone (FLONASE) 50 MCG/ACT nasal spray SPRAY 2 SPRAYS INTO EACH NOSTRIL EVERY DAY 16 mL 0   levonorgestrel (MIRENA) 20 MCG/24HR IUD 1 each by Intrauterine route once.     No current facility-administered medications on file prior to visit.    Past Medical History:  Diagnosis Date   Allergy    Anxiety    Depression  HSV (herpes simplex virus) anogenital infection    IIH (idiopathic intracranial hypertension)    IUD (intrauterine device) in place    Paraguard placed 2014, removed 10/2015   Migraine    Pelvic inflammatory disease 10/2012   Past Surgical History:  Procedure Laterality Date   CESAREAN SECTION N/A 08/19/2018   Procedure: CESAREAN SECTION;  Surgeon: Jaymes Graff, MD;  Location: [facility] LD ORS;  Service: Obstetrics;  Laterality: N/A;   spinal tap     Social History   Socioeconomic History   Marital status: Single    Spouse name: n/a   Number of children: 0   Years of education: 12+   Highest education level: Not on file  Occupational History   Occupation: Archivist  Tobacco Use   Smoking status: Former   Smokeless tobacco: Never  Building services engineer Use: Never used  Substance and Sexual Activity   Alcohol use: Not Currently    Alcohol/week: 2.0 standard drinks    Types: 2 Shots  of liquor per week    Comment: social   Drug use: Not Currently    Types: Other-see comments, Marijuana   Sexual activity: Yes    Partners: Male    Birth control/protection: Condom, Implant    Comment: intercourse age 33, more than 5 sexual partners  Other Topics Concern   Not on file  Social History Narrative   Graduated from high school 10/2012, currently attending college.   Right-handed   Caffeine: 3x/wk   Social Determinants of Health   Financial Resource Strain: Not on file  Food Insecurity: Not on file  Transportation Needs: Not on file  Physical Activity: Not on file  Stress: Not on file  Social Connections: Not on file    Family History  Problem Relation Age of Onset   Emphysema Maternal Grandfather    Depression Maternal Grandfather    Anxiety disorder Maternal Grandfather    Mental illness Maternal Grandfather    Diabetes Maternal Grandfather    Cancer Paternal Grandmother    Non-Hodgkin's lymphoma Paternal Grandmother    Sudden death Paternal Grandmother    Asthma Mother    Hypertension Father    Alcohol abuse Maternal Aunt    Hyperlipidemia Maternal Aunt         Review of Systems  Constitutional:  Negative for fever, malaise/fatigue and weight loss.  HENT:  Negative for congestion and sore throat.   Eyes:        Negative for visual changes  Respiratory:  Negative for cough and shortness of breath.   Cardiovascular:  Negative for chest pain, palpitations and leg swelling.  Gastrointestinal:  Negative for blood in stool, constipation, diarrhea and heartburn.  Genitourinary:  Negative for dysuria, frequency and urgency.  Musculoskeletal:  Negative for falls, joint pain and myalgias.  Skin:  Negative for rash.  Neurological:  Negative for dizziness, sensory change and headaches.  Endo/Heme/Allergies:  Does not bruise/bleed easily.  Psychiatric/Behavioral:  Negative for depression, substance abuse and suicidal ideas. The patient is not nervous/anxious.     Objective:   Vitals:   01/26/21 1338  BP: 116/82  Pulse: 84  Temp: (!) 97.3 F (36.3 C)  SpO2: 99%    Body mass index is 44.86 kg/m.   Physical Examination:  Physical Exam Exam conducted with a chaperone present.  Constitutional:      General: She is not in acute distress.    Appearance: She is obese.  HENT:     Right Ear: Tympanic  membrane, ear canal and external ear normal.     Left Ear: Tympanic membrane and external ear normal.  Eyes:     General: No scleral icterus.    Extraocular Movements: Extraocular movements intact.     Conjunctiva/sclera: Conjunctivae normal.  Cardiovascular:     Rate and Rhythm: Normal rate and regular rhythm.     Pulses: Normal pulses.     Heart sounds: Normal heart sounds.  Pulmonary:     Effort: Pulmonary effort is normal. No respiratory distress.     Breath sounds: Normal breath sounds.  Abdominal:     General: Bowel sounds are normal. There is no distension.     Palpations: Abdomen is soft.     Hernia: There is no hernia in the left inguinal area or right inguinal area.  Genitourinary:    Labia:        Right: No rash or tenderness.        Left: No rash or tenderness.      Vagina: Vaginal discharge present. No erythema, tenderness or lesions.     Cervix: Normal.     Uterus: Normal.      Adnexa: Right adnexa normal and left adnexa normal.     Comments: IUD string in cervical os Musculoskeletal:        General: Normal range of motion.     Cervical back: Normal range of motion and neck supple.     Right lower leg: No edema.     Left lower leg: No edema.  Lymphadenopathy:     Cervical: No cervical adenopathy.     Lower Body: No right inguinal adenopathy. No left inguinal adenopathy.  Skin:    General: Skin is warm and dry.  Neurological:     Mental Status: She is alert and oriented to person, place, and time.  Psychiatric:        Mood and Affect: Mood normal.        Behavior: Behavior normal.        Thought Content:  Thought content normal.   ASSESSMENT and PLAN: This visit occurred during the SARS-CoV-2 public health emergency.  Safety protocols were in place, including screening questions prior to the visit, additional usage of staff PPE, and extensive cleaning of exam room while observing appropriate contact time as indicated for disinfecting solutions.   Stacy Mills was seen today for annual exam.  Diagnoses and all orders for this visit:  Encounter for preventative adult health care exam with abnormal findings -     Comprehensive metabolic panel -     CBC with Differential/Platelet -     Lipid panel -     TSH -     Cytology - PAP -     HIV Antibody (routine testing w rflx) -     RPR -     Cervicovaginal ancillary only  Screen for STD (sexually transmitted disease) -     HIV Antibody (routine testing w rflx) -     RPR -     Cervicovaginal ancillary only  Acanthosis nigricans -     Hemoglobin A1c  Morbid obesity (HCC) -     Lipid panel -     TSH -     Hemoglobin A1c  Encounter for Papanicolaou smear for cervical cancer screening -     Cytology - PAP  Encounter for lipid screening for cardiovascular disease -     Lipid panel     Problem List Items Addressed This Visit  Musculoskeletal and Integument   Acanthosis nigricans   Relevant Orders   Hemoglobin A1c     Other   Morbid obesity (HCC)   Relevant Orders   Lipid panel   TSH   Hemoglobin A1c   Other Visit Diagnoses     Encounter for preventative adult health care exam with abnormal findings    -  Primary   Relevant Orders   Comprehensive metabolic panel   CBC with Differential/Platelet   Lipid panel   TSH   Cytology - PAP   HIV Antibody (routine testing w rflx)   RPR   Cervicovaginal ancillary only   Screen for STD (sexually transmitted disease)       Relevant Orders   HIV Antibody (routine testing w rflx)   RPR   Cervicovaginal ancillary only   Encounter for Papanicolaou smear for cervical cancer  screening       Relevant Orders   Cytology - PAP   Encounter for lipid screening for cardiovascular disease       Relevant Orders   Lipid panel       Follow up: Return in about 6 months (around 07/27/2021) for weight management and risk for diabetes.  Alysia Pennaharlotte Maui Ahart, NP

## 2021-01-26 NOTE — Patient Instructions (Signed)
Go to lab for blood draw  Acanthosis Nigricans Acanthosis nigricans is a condition in which dark, velvety markings appear onthe skin. What are the causes? This condition may be caused by: A hormonal or glandular disorder, such as diabetes. Obesity. Certain medicines, such as birth control pills. A tumor. This is rare. Some people inherit the condition from their parents. What increases the risk? You are more likely to develop this condition if you: Have a hormonal or glandular disorder. Are overweight. Take certain medicines. Have certain cancers, especially stomach cancer. Have dark-colored skin (dark complexion). What are the signs or symptoms? The main symptom of this condition is velvety markings on the skin that are light brown, black, or grayish in color. The markings usually appear on the face. They may also appear in skin fold areas at the neck, armpits, inner thighs, and groin. In severe cases, markings may also appear on the lips, hands, breasts, eyelids, and mouth. How is this diagnosed? This condition may be diagnosed based on your symptoms. A skin sample may be removed for testing (skin biopsy). You may also have tests to help determine the cause of the condition. How is this treated? Treatment for this condition depends on the cause. Treatment may involve reducing insulin levels, which are often high in people who have this condition. Insulin levels can be reduced with: Dietary changes, such as avoiding starchy foods and sugars. Losing weight. Medicines. Sometimes, treatment involves: Medicines to improve the appearance of the skin. Laser treatment to improve the appearance of the skin. Surgical removal of the skin markings (dermabrasion). Follow these instructions at home: Follow diet instructions from your health care provider. Lose weight if you are overweight. Take over-the-counter and prescription medicines only as told by your health care provider. Keep all  follow-up visits as told by your health care provider. This is important. Contact a health care provider if: New skin markings develop on a part of the body where they rarely develop, such as on your lips, hands, breasts, eyelids, or mouth. The condition recurs for an unknown reason. Summary Acanthosis nigricans is a condition in which dark, velvety markings appear on the skin. Treatment for this condition depends on the cause. Treatment may include dietary changes, medicines, laser treatment, or surgery. Take over-the-counter and prescription medicines only as told by your health care provider. Contact a health care provider if new skin markings develop on a part of the body where they rarely develop, such as on your lips, hands, breasts, eyelids, or mouth. Keep all follow-up visits as told by your health care provider. This is important. This information is not intended to replace advice given to you by your health care provider. Make sure you discuss any questions you have with your healthcare provider. Document Revised: 09/25/2017 Document Reviewed: 09/25/2017 Elsevier Patient Education  2022 [employer].

## 2021-01-27 ENCOUNTER — Encounter: Payer: Self-pay | Admitting: Nurse Practitioner

## 2021-01-27 LAB — CERVICOVAGINAL ANCILLARY ONLY
Bacterial Vaginitis (gardnerella): POSITIVE — AB
Candida Glabrata: NEGATIVE
Candida Vaginitis: NEGATIVE
Chlamydia: NEGATIVE
Comment: NEGATIVE
Comment: NEGATIVE
Comment: NEGATIVE
Comment: NEGATIVE
Comment: NEGATIVE
Comment: NORMAL
Neisseria Gonorrhea: NEGATIVE
Trichomonas: NEGATIVE

## 2021-01-27 LAB — LIPID PANEL
Cholesterol: 156 mg/dL (ref 0–200)
HDL: 51.2 mg/dL (ref >39.00)
LDL Cholesterol: 95 mg/dL (ref 0–99)
NonHDL: 104.3
Total CHOL/HDL Ratio: 3
Triglycerides: 45 mg/dL (ref 0.0–149.0)
VLDL: 9 mg/dL (ref 0.0–40.0)

## 2021-01-27 LAB — COMPREHENSIVE METABOLIC PANEL
ALT: 14 U/L (ref 0–35)
AST: 16 U/L (ref 0–37)
Albumin: 4 g/dL (ref 3.5–5.2)
Alkaline Phosphatase: 57 U/L (ref 39–117)
BUN: 9 mg/dL (ref 6–23)
CO2: 25 mEq/L (ref 19–32)
Calcium: 9.8 mg/dL (ref 8.4–10.5)
Chloride: 105 mEq/L (ref 96–112)
Creatinine, Ser: 1.09 mg/dL (ref 0.40–1.20)
GFR: 70.08 mL/min (ref >60.00)
Glucose, Bld: 71 mg/dL (ref 70–99)
Potassium: 4.1 mEq/L (ref 3.5–5.1)
Sodium: 140 mEq/L (ref 135–145)
Total Bilirubin: 0.5 mg/dL (ref 0.2–1.2)
Total Protein: 7.2 g/dL (ref 6.0–8.3)

## 2021-01-27 LAB — CBC WITH DIFFERENTIAL/PLATELET
Basophils Absolute: 0.1 10*3/uL (ref 0.0–0.1)
Basophils Relative: 0.8 % (ref 0.0–3.0)
Eosinophils Absolute: 0.1 10*3/uL (ref 0.0–0.7)
Eosinophils Relative: 0.8 % (ref 0.0–5.0)
HCT: 39.3 % (ref 36.0–46.0)
Hemoglobin: 12.9 g/dL (ref 12.0–15.0)
Lymphocytes Relative: 32.2 % (ref 12.0–46.0)
Lymphs Abs: 3.3 10*3/uL (ref 0.7–4.0)
MCHC: 33 g/dL (ref 30.0–36.0)
MCV: 89 fl (ref 78.0–100.0)
Monocytes Absolute: 0.7 10*3/uL (ref 0.1–1.0)
Monocytes Relative: 6.5 % (ref 3.0–12.0)
Neutro Abs: 6.1 10*3/uL (ref 1.4–7.7)
Neutrophils Relative %: 59.7 % (ref 43.0–77.0)
Platelets: 296 10*3/uL (ref 150.0–400.0)
RBC: 4.41 Mil/uL (ref 3.87–5.11)
RDW: 12.8 % (ref 11.5–15.5)
WBC: 10.2 10*3/uL (ref 4.0–10.5)

## 2021-01-27 LAB — CYTOLOGY - PAP
Comment: NEGATIVE
Diagnosis: NEGATIVE
High risk HPV: NEGATIVE

## 2021-01-27 LAB — HIV ANTIBODY (ROUTINE TESTING W REFLEX): HIV 1&2 Ab, 4th Generation: NONREACTIVE

## 2021-01-27 LAB — HEMOGLOBIN A1C: Hgb A1c MFr Bld: 5.2 % (ref 4.6–6.5)

## 2021-01-27 LAB — RPR: RPR Ser Ql: NONREACTIVE

## 2021-01-27 LAB — TSH: TSH: 0.54 u[IU]/mL (ref 0.35–5.50)

## 2021-01-28 NOTE — Telephone Encounter (Signed)
Pt called about the msg she put in regarding medicine after lab results.

## 2021-01-29 ENCOUNTER — Encounter: Payer: Self-pay | Admitting: Nurse Practitioner

## 2021-01-29 MED ORDER — METRONIDAZOLE 0.75 % VA GEL: 1.0000 | Freq: Every day | VAGINAL | 0 refills | Status: DC

## 2021-01-29 NOTE — Addendum Note (Signed)
Addended by: Alysia Penna L on: 01/29/2021 04:01 PM   Modules accepted: Orders

## 2021-02-02 NOTE — Telephone Encounter (Signed)
please review and advise.  Thanks. Dm/cma

## 2021-02-05 ENCOUNTER — Other Ambulatory Visit: Payer: Self-pay | Admitting: Nurse Practitioner

## 2021-02-05 DIAGNOSIS — J302 Other seasonal allergic rhinitis: Secondary | ICD-10-CM

## 2021-05-14 ENCOUNTER — Encounter: Payer: Self-pay | Admitting: Nurse Practitioner

## 2021-07-14 ENCOUNTER — Encounter: Payer: Self-pay | Admitting: Nurse Practitioner

## 2021-07-17 ENCOUNTER — Other Ambulatory Visit: Payer: Self-pay | Admitting: Nurse Practitioner

## 2021-07-17 DIAGNOSIS — N760 Acute vaginitis: Secondary | ICD-10-CM

## 2021-07-17 DIAGNOSIS — B9689 Other specified bacterial agents as the cause of diseases classified elsewhere: Secondary | ICD-10-CM

## 2021-07-27 ENCOUNTER — Encounter: Payer: Self-pay | Admitting: Nurse Practitioner

## 2021-07-27 ENCOUNTER — Other Ambulatory Visit: Payer: Self-pay

## 2021-07-27 ENCOUNTER — Ambulatory Visit: Payer: Medicaid Other | Admitting: Nurse Practitioner

## 2021-07-27 VITALS — BP 118/86 | HR 88 | Temp 98.3°F | Ht 64.25 in | Wt 257.2 lb

## 2021-07-27 DIAGNOSIS — Z6841 Body Mass Index (BMI) 40.0 and over, adult: Secondary | ICD-10-CM | POA: Diagnosis not present

## 2021-07-27 NOTE — Patient Instructions (Addendum)
HgbA1c at 5.4%: normal Start multivitamin 1tab daily with food. Start heart healthy diet, small meal portions, avoid skipping meal. Moderate intensity exercise ( per day) You can use home videos and walking for exercise. You will be contacted to schedule appt with weight loss clinic.  How to Increase Your Level of Physical Activity Getting regular physical activity is important for your overall health and well-being. Most people do not get enough exercise. There are easy ways to increase your level of physical activity, even if you have not been very active in the past or if you are just starting out. What are the benefits of physical activity? Physical activity has many short-term and long-term benefits. Being active on a regular basis can improve your physical and mental health as well as provide other benefits. Physical health benefits Helping you lose weight or maintain a healthy weight. Strengthening your muscles and bones. Reducing your risk of certain long-term (chronic) diseases, including heart disease, cancer, and diabetes. Being able to move around more easily and for longer periods of time without getting tired (increased endurance or stamina). Improving your ability to fight off illness (enhanced immunity). Being able to sleep better. Helping you stay healthy as you get older, including: Helping you stay mobile, or capable of walking and moving around. Preventing accidents, such as falls. Increasing life expectancy. Mental health benefits Boosting your mood and improving your self-esteem. Lowering your chance of having mental health problems, such as depression or anxiety. Helping you feel good about your body. Other benefits Finding new sources of fun and enjoyment. Meeting new people who share a common interest. Before you begin If you have a chronic illness or have not been active for a while, check with your health care provider about how to get started. Ask your  health care provider what activities are safe for you. Start out slowly. Walking or doing some simple chair exercises is a good place to start, especially if you have not been active before or for a long time. Set goals that you can work toward. Ask your health care provider how much exercise is best for you. In general, most adults should: Do moderate-intensity exercise for at least 150 minutes each week (30 minutes on most days of the week) or vigorous exercise for at least 75 minutes each week, or a combination of these. Moderate-intensity exercise can include walking at a quick pace, biking, yoga, water aerobics, or gardening. Vigorous exercise involves activities that take more effort, such as jogging or running, playing sports, swimming laps, or jumping rope. Do strength exercises on at least 2 days each week. This can include weight lifting, body weight exercises, and resistance-band exercises. How to be more physically active Make a plan  Try to find activities that you enjoy. You are more likely to commit to an exercise routine if it does not feel like a chore. If you have bone or joint problems, choose low-impact exercises, like walking or swimming. Use these tips for being successful with an exercise plan: Find a workout partner for accountability. Join a group or class, such as an aerobics class, cycling class, or sports team. Make family time active. Go for a walk, bike, or swim. Include a variety of exercises each week. Consider using a fitness tracker, such as a mobile phone app or a device worn like a watch, that will count the number of steps you take each day. Many people strive to reach 10,000 steps a day. Find ways to be active  in your daily routines Besides your formal exercise plans, you can find ways to do physical activity during your daily routines, such as: Walking or biking to work or to the store. Taking the stairs instead of the elevator. Parking farther away from  the door at work or at the store. Planning walking meetings. Walking around while you are on the phone. Where to find more information Centers for Disease Control and Prevention: CampusCasting.com.pt [employer]'s Council on Fitness, Sports & Nutrition: www.fitness.gov ChooseMyPlate: http://www.harvey.com/ Contact a health care provider if: You have headaches, muscle aches, or joint pain that is concerning. You feel dizzy or light-headed while exercising. You faint. You feel your heart skipping, racing, or fluttering. You have chest pain while exercising. Summary Exercise benefits your mind and body at any age, even if you are just starting out. If you have a chronic illness or have not been active for a while, check with your health care provider before increasing your physical activity. Choose activities that are safe and enjoyable for you. Ask your health care provider what activities are safe for you. Start slowly. Tell your health care provider if you have problems as you start to increase your activity level. This information is not intended to replace advice given to you by your health care provider. Make sure you discuss any questions you have with your health care provider. Document Revised: 09/11/2020 Document Reviewed: 09/11/2020 Elsevier Patient Education  2022 Else[employer] Inc.   Calorie Counting for [employer] Calories are units of energy. Your body needs a certain number of calories from food to keep going throughout the day. When you eat or drink more calories than your body needs, your body stores the extra calories mostly as fat. When you eat or drink fewer calories than your body needs, your body burns fat to get the energy it needs. Calorie counting means keeping track of how many calories you eat and drink each day. Calorie counting can be helpful if you need to lose weight. If you eat fewer calories than your body needs, you should lose weight. Ask your health care  provider what a healthy weight is for you. For calorie counting to work, you will need to eat the right number of calories each day to lose a healthy amount of weight per week. A dietitian can help you figure out how many calories you need in a day and will suggest ways to reach your calorie goal. A healthy amount of weight to lose each week is usually 1-2 lb (0.5-0.9 kg). This usually means that your daily calorie intake should be reduced by 500-750 calories. Eating 1,200-1,500 calories a day can help most women lose weight. Eating 1,500-1,800 calories a day can help most men lose weight. What do I need to know about calorie counting? Work with your health care provider or dietitian to determine how many calories you should get each day. To meet your daily calorie goal, you will need to: Find out how many calories are in each food that you would like to eat. Try to do this before you eat. Decide how much of the food you plan to eat. Keep a food log. Do this by writing down what you ate and how many calories it had. To successfully lose weight, it is important to balance calorie counting with a healthy lifestyle that includes regular activity. Where do I find calorie information? The number of calories in a food can be found on a Nutrition Facts label. If a food  does not have a Nutrition Facts label, try to look up the calories online or ask your dietitian for help. Remember that calories are listed per serving. If you choose to have more than one serving of a food, you will have to multiply the calories per serving by the number of servings you plan to eat. For example, the label on a package of bread might say that a serving size is 1 slice and that there are 90 calories in a serving. If you eat 1 slice, you will have eaten 90 calories. If you eat 2 slices, you will have eaten 180 calories. How do I keep a food log? After each time that you eat, record the following in your food log as soon as  possible: What you ate. Be sure to include toppings, sauces, and other extras on the food. How much you ate. This can be measured in cups, ounces, or number of items. How many calories were in each food and drink. The total number of calories in the food you ate. Keep your food log near you, such as in a pocket-sized notebook or on an app or website on your mobile phone. Some programs will calculate calories for you and show you how many calories you have left to meet your daily goal. What are some portion-control tips? Know how many calories are in a serving. This will help you know how many servings you can have of a certain food. Use a measuring cup to measure serving sizes. You could also try weighing out portions on a kitchen scale. With time, you will be able to estimate serving sizes for some foods. Take time to put servings of different foods on your favorite plates or in your favorite bowls and cups so you know what a serving looks like. Try not to eat straight from a food's packaging, such as from a bag or box. Eating straight from the package makes it hard to see how much you are eating and can lead to overeating. Put the amount you would like to eat in a cup or on a plate to make sure you are eating the right portion. Use smaller plates, glasses, and bowls for smaller portions and to prevent overeating. Try not to multitask. For example, avoid watching TV or using your computer while eating. If it is time to eat, sit down at a table and enjoy your food. This will help you recognize when you are full. It will also help you be more mindful of what and how much you are eating. What are tips for following this plan? Reading food labels Check the calorie count compared with the serving size. The serving size may be smaller than what you are used to eating. Check the source of the calories. Try to choose foods that are high in protein, fiber, and vitamins, and low in saturated fat, trans fat,  and sodium. Shopping Read nutrition labels while you shop. This will help you make healthy decisions about which foods to buy. Pay attention to nutrition labels for low-fat or fat-free foods. These foods sometimes have the same number of calories or more calories than the full-fat versions. They also often have added sugar, starch, or salt to make up for flavor that was removed with the fat. Make a grocery list of lower-calorie foods and stick to it. Cooking Try to cook your favorite foods in a healthier way. For example, try baking instead of frying. Use low-fat dairy products. Meal planning Use more  fruits and vegetables. One-half of your plate should be fruits and vegetables. Include lean proteins, such as chicken, [place], and fish. Lifestyle Each week, aim to do one of the following: 150 minutes of moderate exercise, such as walking. 75 minutes of vigorous exercise, such as running. General information Know how many calories are in the foods you eat most often. This will help you calculate calorie counts faster. Find a way of tracking calories that works for you. Get creative. Try different apps or programs if writing down calories does not work for you. What foods should I eat?  Eat nutritious foods. It is better to have a nutritious, high-calorie food, such as an avocado, than a food with few nutrients, such as a bag of potato chips. Use your calories on foods and drinks that will fill you up and will not leave you hungry soon after eating. Examples of foods that fill you up are nuts and nut butters, vegetables, lean proteins, and high-fiber foods such as whole grains. High-fiber foods are foods with more than 5 g of fiber per serving. Pay attention to calories in drinks. Low-calorie drinks include water and unsweetened drinks. The items listed above may not be a complete list of foods and beverages you can eat. Contact a dietitian for more information. What foods should I  limit? Limit foods or drinks that are not good sources of vitamins, minerals, or protein or that are high in unhealthy fats. These include: Candy. Other sweets. Sodas, specialty coffee drinks, alcohol, and juice. The items listed above may not be a complete list of foods and beverages you should avoid. Contact a dietitian for more information. How do I count calories when eating out? Pay attention to portions. Often, portions are much larger when eating out. Try these tips to keep portions smaller: Consider sharing a meal instead of getting your own. If you get your own meal, eat only half of it. Before you start eating, ask for a container and put half of your meal into it. When available, consider ordering smaller portions from the menu instead of full portions. Pay attention to your food and drink choices. Knowing the way food is cooked and what is included with the meal can help you eat fewer calories. If calories are listed on the menu, choose the lower-calorie options. Choose dishes that include vegetables, fruits, whole grains, low-fat dairy products, and lean proteins. Choose items that are boiled, broiled, grilled, or steamed. Avoid items that are buttered, battered, fried, or served with cream sauce. Items labeled as crispy are usually fried, unless stated otherwise. Choose water, low-fat milk, unsweetened iced tea, or other drinks without added sugar. If you want an alcoholic beverage, choose a lower-calorie option, such as a glass of wine or light beer. Ask for dressings, sauces, and syrups on the side. These are usually high in calories, so you should limit the amount you eat. If you want a salad, choose a garden salad and ask for grilled meats. Avoid extra toppings such as bacon, cheese, or fried items. Ask for the dressing on the side, or ask for olive oil and vinegar or lemon to use as dressing. Estimate how many servings of a food you are given. Knowing serving sizes will help you  be aware of how much food you are eating at restaurants. Where to find more information Centers for Disease Control and Prevention: FootballExhibition.com.br U.S. Department of Agriculture: WrestlingReporter.dk Summary Calorie counting means keeping track of how many calories you eat and  drink each day. If you eat fewer calories than your body needs, you should lose weight. A healthy amount of weight to lose per week is usually 1-2 lb (0.5-0.9 kg). This usually means reducing your daily calorie intake by 500-750 calories. The number of calories in a food can be found on a Nutrition Facts label. If a food does not have a Nutrition Facts label, try to look up the calories online or ask your dietitian for help. Use smaller plates, glasses, and bowls for smaller portions and to prevent overeating. Use your calories on foods and drinks that will fill you up and not leave you hungry shortly after a meal. This information is not intended to replace advice given to you by your health care provider. Make sure you discuss any questions you have with your health care provider. Document Revised: 06/27/2019 Document Reviewed: 06/27/2019 Elsevier Patient Education  2022 [employer].

## 2021-07-27 NOTE — Progress Notes (Signed)
Subjective:  Patient ID: Stacy Mills, female    DOB: Oct 21, 1995  Age: 26 y.o. MRN: [id number]  CC: Follow-up (6 month f/u for weight management and risk for diabetes. )  HPI  Morbid obesity (HCC) She has not made any lifestyle modification. She is interested in referral to weight management clinic. She admits to skipping meals, due to lack of appetite. She denies any anxiety or depression. We discussed ways to incorporate healthy food choices (eating whole fruit in place of candy, eating celery/cucumber/carrots/nuts in place of chips, eating hummus/salsa in place of cheese dip, eating salad vinegrette in place of creamy dressings)  Wt Readings from Last 3 Encounters:  07/27/21 257 lb 3.2 oz (116.7 kg)  01/26/21 263 lb 6.4 oz (119.5 kg)  12/24/20 269 lb 9.6 oz (122.3 kg)   Repeat HgbA1c at 5.4%: normal Start multivitamin 1tab daily with food. Start heart healthy diet, small meal portions, avoid skipping meal. Moderate intensity exercise ( per day) You can use home videos and walking for exercise. You will be contacted to schedule appt with weight loss clinic. F/up in 36months   Reviewed past Medical, Social and Family history today.  Outpatient Medications Prior to Visit  Medication Sig Dispense Refill   acetaminophen (TYLENOL) 325 MG tablet Take 650 mg by mouth every 6 (six) hours as needed for mild pain or moderate pain.     cetirizine (ZYRTEC) 10 MG tablet TAKE 1 TABLET BY MOUTH EVERYDAY AT BEDTIME 30 tablet 0   fluticasone (FLONASE) 50 MCG/ACT nasal spray SPRAY 2 SPRAYS INTO EACH NOSTRIL EVERY DAY 16 mL 0   levonorgestrel (MIRENA) 20 MCG/24HR IUD 1 each by Intrauterine route once.     metroNIDAZOLE (METROGEL VAGINAL) 0.75 % vaginal gel Place 1 Applicatorful vaginally at bedtime. (Patient not taking: Reported on 07/27/2021) 70 g 0   No facility-administered medications prior to visit.   ROS See HPI  Objective:  BP 118/86 (BP Location: Left Arm, Patient Position:  Sitting, Cuff Size: Large)    Pulse 88    Temp 98.3 F (36.8 C) (Temporal)    Ht 5' 4.25" (1.632 m)    Wt 257 lb 3.2 oz (116.7 kg)    SpO2 99%    BMI 43.81 kg/m   Physical Exam Vitals reviewed.  Cardiovascular:     Rate and Rhythm: Normal rate.     Pulses: Normal pulses.  Pulmonary:     Effort: Pulmonary effort is normal.  Abdominal:     General: Bowel sounds are normal.  Neurological:     Mental Status: She is alert and oriented to person, place, and time.  Psychiatric:        Mood and Affect: Mood normal.        Behavior: Behavior normal.        Thought Content: Thought content normal.    Assessment & Plan:  This visit occurred during the SARS-CoV-2 public health emergency.  Safety protocols were in place, including screening questions prior to the visit, additional usage of staff PPE, and extensive cleaning of exam room while observing appropriate contact time as indicated for disinfecting solutions.   Stacy Mills was seen today for follow-up.  Diagnoses and all orders for this visit:  Class 3 severe obesity due to excess calories without serious comorbidity with body mass index (BMI) of 40.0 to 44.9 in adult (HCC) -     POCT glycosylated hemoglobin (Hb A1C) -     Amb Ref to Medical Weight Management   Problem List  Items Addressed This Visit       Other   Morbid obesity (HCC) - Primary    She has not made any lifestyle modification. She is interested in referral to weight management clinic. She admits to skipping meals, due to lack of appetite. She denies any anxiety or depression. We discussed ways to incorporate healthy food choices (eating whole fruit in place of candy, eating celery/cucumber/carrots/nuts in place of chips, eating hummus/salsa in place of cheese dip, eating salad vinegrette in place of creamy dressings)  Wt Readings from Last 3 Encounters:  07/27/21 257 lb 3.2 oz (116.7 kg)  01/26/21 263 lb 6.4 oz (119.5 kg)  12/24/20 269 lb 9.6 oz (122.3 kg)   Repeat  HgbA1c at 5.4%: normal Start multivitamin 1tab daily with food. Start heart healthy diet, small meal portions, avoid skipping meal. Moderate intensity exercise ( per day) You can use home videos and walking for exercise. You will be contacted to schedule appt with weight loss clinic. F/up in 64months       I have spent with this patient regarding history taking, documentation, review of labs, formulating plan and discussing treatment options with patient.  Follow-up: Return in about 3 months (around 10/24/2021) for weight management.Alysia Penna, NP

## 2021-07-27 NOTE — Assessment & Plan Note (Addendum)
She has not made any lifestyle modification. She is interested in referral to weight management clinic. She admits to skipping meals, due to lack of appetite. She denies any anxiety or depression. We discussed ways to incorporate healthy food choices (eating whole fruit in place of candy, eating celery/cucumber/carrots/nuts in place of chips, eating hummus/salsa in place of cheese dip, eating salad vinegrette in place of creamy dressings)  Wt Readings from Last 3 Encounters:  07/27/21 257 lb 3.2 oz (116.7 kg)  01/26/21 263 lb 6.4 oz (119.5 kg)  12/24/20 269 lb 9.6 oz (122.3 kg)   Repeat HgbA1c at 5.4%: normal Start multivitamin 1tab daily with food. Start heart healthy diet, small meal portions, avoid skipping meal. Moderate intensity exercise (118mins per day) You can use home videos and walking for exercise. You will be contacted to schedule appt with weight loss clinic. F/up in 62months

## 2021-07-28 LAB — POCT GLYCOSYLATED HEMOGLOBIN (HGB A1C): Hemoglobin A1C: 5.4 % (ref 4.0–5.6)

## 2021-08-14 ENCOUNTER — Encounter: Payer: Self-pay | Admitting: Nurse Practitioner

## 2021-08-23 ENCOUNTER — Other Ambulatory Visit (HOSPITAL_COMMUNITY)
Admission: RE | Admit: 2021-08-23 | Discharge: 2021-08-23 | Disposition: A | Discharge status: Home / Self Care | Payer: Medicaid Other | Source: Ambulatory Visit | Attending: Nurse Practitioner | Admitting: Nurse Practitioner

## 2021-08-23 ENCOUNTER — Encounter: Payer: Self-pay | Admitting: Nurse Practitioner

## 2021-08-23 ENCOUNTER — Ambulatory Visit: Payer: Medicaid Other | Admitting: Nurse Practitioner

## 2021-08-23 DIAGNOSIS — R300 Dysuria: Secondary | ICD-10-CM | POA: Insufficient documentation

## 2021-08-23 DIAGNOSIS — N3001 Acute cystitis with hematuria: Secondary | ICD-10-CM

## 2021-08-23 DIAGNOSIS — R822 Biliuria: Secondary | ICD-10-CM

## 2021-08-23 DIAGNOSIS — N3000 Acute cystitis without hematuria: Secondary | ICD-10-CM | POA: Diagnosis not present

## 2021-08-23 LAB — POCT URINALYSIS DIPSTICK
Glucose, UA: NEGATIVE
Ketones, UA: NEGATIVE
Nitrite, UA: POSITIVE
Protein, UA: POSITIVE — AB
Spec Grav, UA: 1.025 (ref 1.010–1.025)
Urobilinogen, UA: >=8 E.U./dL — AB
pH, UA: 5 (ref 5.0–8.0)

## 2021-08-23 MED ORDER — NITROFURANTOIN MONOHYD MACRO 100 MG PO CAPS: 100.0000 mg | ORAL_CAPSULE | Freq: Two times a day (BID) | ORAL | 0 refills | Status: DC

## 2021-08-23 NOTE — Progress Notes (Signed)
? ?Acute Office Visit ? ?Subjective:  ? ? Patient ID: Stacy Mills, female    DOB: [birth year], 26 y.o.   MRN: [id number] ? ?Chief Complaint  ?Patient presents with  ? Urinary Tract Infection  ?  Pt c/o UTI symptoms, burning and pain while urinating, frequency, w/ lower abd cramping and lower left flank pian x1 day. Pt is taking AZO  ? ? ?HPI ?Patient is in today for urinary frequency, burning, and left flank pain since yesterday.  ? ?URINARY SYMPTOMS ? ?Dysuria: burning ?Urinary frequency: yes ?Urgency: yes ?Small volume voids: yes ?Symptom severity:  moderate ?Urinary incontinence: no ?Foul odor: yes ?Hematuria: no ?Abdominal pain: no ?Back pain: yes ?Suprapubic pain/pressure: yes ?Flank pain: yes - left side ?Fever:  no ?Vomiting: no ?Relief with cranberry juice:  n/a ?Relief with pyridium:  n/a ?Status: better/worse/stable ?Previous urinary tract infection: no ?Recurrent urinary tract infection: no ?Sexual activity: practicing safe sex ?History of sexually transmitted disease: yes ?Treatments attempted: azo, drinking water ? ? ?Past Medical History:  ?Diagnosis Date  ? Allergy   ? Anxiety   ? Depression   ? HSV (herpes simplex virus) anogenital infection   ? IIH (idiopathic intracranial hypertension)   ? IUD (intrauterine device) in place   ? Paraguard placed 2014, removed 10/2015  ? Migraine   ? Pelvic inflammatory disease 10/2012  ? ? ?Past Surgical History:  ?Procedure Laterality Date  ? CESAREAN SECTION N/A 08/19/2018  ? Procedure: CESAREAN SECTION;  Surgeon: Jaymes Graff, MD;  Location: [facility];  Service: Obstetrics;  Laterality: N/A;  ? spinal tap    ? ? ?Family History  ?Problem Relation Age of Onset  ? Emphysema Maternal Grandfather   ? Depression Maternal Grandfather   ? Anxiety disorder Maternal Grandfather   ? Mental illness Maternal Grandfather   ? Diabetes Maternal Grandfather   ? Cancer Paternal Grandmother   ? Non-Hodgkin's lymphoma Paternal Grandmother   ? Sudden death Paternal Grandmother   ?  Asthma Mother   ? Hypertension Father   ? Alcohol abuse Maternal Aunt   ? Hyperlipidemia Maternal Aunt   ? ? ?Social History  ? ?Socioeconomic History  ? Marital status: Single  ?  Spouse name: n/a  ? Number of children: 0  ? Years of education: 12+  ? Highest education level: Not on file  ?Occupational History  ? Occupation: Archivist  ?Tobacco Use  ? Smoking status: Former  ? Smokeless tobacco: Never  ?Vaping Use  ? Vaping Use: Never used  ?Substance and Sexual Activity  ? Alcohol use: Not Currently  ?  Alcohol/week: 2.0 standard drinks  ?  Types: 2 Shots of liquor per week  ?  Comment: social  ? Drug use: Not Currently  ?  Types: Other-see comments, Marijuana  ? Sexual activity: Yes  ?  Partners: Male  ?  Birth control/protection: Condom, Implant  ?  Comment: intercourse age 62, more than 5 sexual partners  ?Other Topics Concern  ? Not on file  ?Social History Narrative  ? Graduated from high school 10/2012, currently attending college.  ? Right-handed  ? Caffeine: 3x/wk  ? ?Social Determinants of Health  ? ?Financial Resource Strain: Not on file  ?Food Insecurity: Not on file  ?Transportation Needs: Not on file  ?Physical Activity: Not on file  ?Stress: Not on file  ?Social Connections: Not on file  ?Intimate Partner Violence: Not on file  ? ? ?Outpatient Medications Prior to Visit  ?Medication Sig Dispense Refill  ?  acetaminophen (TYLENOL) 325 MG tablet Take 650 mg by mouth every 6 (six) hours as needed for mild pain or moderate pain.    ? cetirizine (ZYRTEC) 10 MG tablet TAKE 1 TABLET BY MOUTH EVERYDAY AT BEDTIME 30 tablet 0  ? fluticasone (FLONASE) 50 MCG/ACT nasal spray SPRAY 2 SPRAYS INTO EACH NOSTRIL EVERY DAY 16 mL 0  ? levonorgestrel (MIRENA) 20 MCG/24HR IUD 1 each by Intrauterine route once.    ? ?No facility-administered medications prior to visit.  ? ? ?Allergies  ?Allergen Reactions  ? Ivp Dye [Iodinated Contrast Media] Itching and Nausea Only  ?  Contrast for MRI  ? ? ?Review of Systems ?See  pertinent positives and negatives per HPI. ?   ?Objective:  ?  ?Physical Exam ?Vitals and nursing note reviewed.  ?Constitutional:   ?   General: She is not in acute distress. ?   Appearance: Normal appearance.  ?HENT:  ?   Head: Normocephalic.  ?Eyes:  ?   Conjunctiva/sclera: Conjunctivae normal.  ?Cardiovascular:  ?   Rate and Rhythm: Normal rate and regular rhythm.  ?   Pulses: Normal pulses.  ?   Heart sounds: Normal heart sounds.  ?Pulmonary:  ?   Effort: Pulmonary effort is normal.  ?   Breath sounds: Normal breath sounds.  ?Abdominal:  ?   General: There is no distension.  ?   Palpations: Abdomen is soft.  ?   Tenderness: There is abdominal tenderness (mild suprapubic tenderness). There is no right CVA tenderness or left CVA tenderness.  ?Musculoskeletal:  ?   Cervical back: Normal range of motion.  ?Skin: ?   General: Skin is warm.  ?Neurological:  ?   General: No focal deficit present.  ?   Mental Status: She is alert and oriented to person, place, and time.  ?Psychiatric:     ?   Mood and Affect: Mood normal.     ?   Behavior: Behavior normal.     ?   Thought Content: Thought content normal.     ?   Judgment: Judgment normal.  ? ? ?There were no vitals taken for this visit. ?Wt Readings from Last 3 Encounters:  ?07/27/21 257 lb 3.2 oz (116.7 kg)  ?01/26/21 263 lb 6.4 oz (119.5 kg)  ?12/24/20 269 lb 9.6 oz (122.3 kg)  ? ? ?Health Maintenance Due  ?Topic Date Due  ? INFLUENZA VACCINE  12/28/2020  ? ? ?There are no preventive care reminders to display for this patient. ? ? ?Lab Results  ?Component Value Date  ? TSH 0.54 01/26/2021  ? ?Lab Results  ?Component Value Date  ? WBC 10.2 01/26/2021  ? HGB 12.9 01/26/2021  ? HCT 39.3 01/26/2021  ? MCV 89.0 01/26/2021  ? PLT 296.0 01/26/2021  ? ?Lab Results  ?Component Value Date  ? NA 140 01/26/2021  ? K 4.1 01/26/2021  ? CO2 25 01/26/2021  ? GLUCOSE 71 01/26/2021  ? BUN 9 01/26/2021  ? CREATININE 1.09 01/26/2021  ? BILITOT 0.5 01/26/2021  ? ALKPHOS 57 01/26/2021  ?  AST 16 01/26/2021  ? ALT 14 01/26/2021  ? PROT 7.2 01/26/2021  ? ALBUMIN 4.0 01/26/2021  ? CALCIUM 9.8 01/26/2021  ? ANIONGAP 9 05/31/2019  ? GFR 70.08 01/26/2021  ? ?Lab Results  ?Component Value Date  ? CHOL 156 01/26/2021  ? ?Lab Results  ?Component Value Date  ? HDL 51.20 01/26/2021  ? ?Lab Results  ?Component Value Date  ? LDLCALC 95 01/26/2021  ? ?  Lab Results  ?Component Value Date  ? TRIG 45.0 01/26/2021  ? ?Lab Results  ?Component Value Date  ? CHOLHDL 3 01/26/2021  ? ?Lab Results  ?Component Value Date  ? HGBA1C 5.4 07/27/2021  ? ? ?   ?Assessment & Plan:  ? ?Problem List Items Addressed This Visit   ?None ?Visit Diagnoses   ? ? Acute cystitis with hematuria    -  Primary  ? We will treat with Macrobid 100 mg twice daily x5 days.  Urine sent for culture.  Drink plenty of fluids.  Follow-up if symptoms are improving.  ? Relevant Orders  ? Urine Culture  ? Dysuria      ? UA positive for 3+ leukocytes, 3+ blood.  Treat for UTI.  We will also check for STD per patient request.  ? Relevant Orders  ? POCT urinalysis dipstick (Completed)  ? Urine cytology ancillary only  ? ?  ? ? ? ?Meds ordered this encounter  ?Medications  ? nitrofurantoin, macrocrystal-monohydrate, (MACROBID) 100 MG capsule  ?  Sig: Take 1 capsule (100 mg total) by mouth 2 (two) times daily.  ?  Dispense:  10 capsule  ?  Refill:  0  ? ? ? ?Gerre ScullLauren A Clotilda Hafer, NP ? ?

## 2021-08-23 NOTE — Patient Instructions (Addendum)
It was great to see you! ? ?Start macrobid (antibiotic) 1 tablet twice a day. Make sure you drink plenty of fluids. You can take tylenol as needed for pain. ? ?Let's follow-up if your symptoms don't improve or worsen ? ?Take care, ? ?Rodman Pickle, NP ? ?

## 2021-08-25 LAB — URINE CYTOLOGY ANCILLARY ONLY
Bacterial Vaginitis-Urine: NEGATIVE
Candida Urine: NEGATIVE
Chlamydia: NEGATIVE
Comment: NEGATIVE
Comment: NEGATIVE
Comment: NORMAL
Neisseria Gonorrhea: NEGATIVE
Trichomonas: NEGATIVE

## 2021-08-26 LAB — URINE CULTURE
MICRO NUMBER:: 13183614
SPECIMEN QUALITY:: ADEQUATE

## 2021-08-26 MED ORDER — SULFAMETHOXAZOLE-TRIMETHOPRIM 800-160 MG PO TABS: 1.0000 | ORAL_TABLET | Freq: Two times a day (BID) | ORAL | 0 refills | Status: DC

## 2021-08-26 NOTE — Addendum Note (Signed)
Addended by: Vance Peper A on: 08/26/2021 08:48 AM ? ? Modules accepted: Orders ? ?

## 2021-08-26 NOTE — Progress Notes (Signed)
Called and informed patient of results and provider instructions. Patient voiced understanding.

## 2021-08-30 NOTE — Progress Notes (Signed)
? ?Acute Office Visit ? ?Subjective:  ? ? Patient ID: Stacy Mills, female    DOB: 02-May-1996, 26 y.o.   MRN: [id number] ? ?Chief Complaint  ?Patient presents with  ? Follow-up  ?  F/u UTI. Pt c/o lower back pain, feeling clammy and sweaty w/ nausea and abd cramps for several days ?  ? ? ?HPI ?Patient is in today for ongoing fever, back pain, and lower abdominal cramping. The dysuria is improving. She endorses nausea, denies vomiting. ? ?She also noticed a lump on the back of her neck. It's painful to touch. She endorses nasal congestion however she has a history of allergies and has not been taking her allergy medication.  She denies sore throat, ear pain, shortness of breath and chest pain. ? ?Past Medical History:  ?Diagnosis Date  ? Allergy   ? Anxiety   ? Depression   ? HSV (herpes simplex virus) anogenital infection   ? IIH (idiopathic intracranial hypertension)   ? IUD (intrauterine device) in place   ? Paraguard placed 2014, removed 10/2015  ? Migraine   ? Pelvic inflammatory disease 10/2012  ? ? ?Past Surgical History:  ?Procedure Laterality Date  ? CESAREAN SECTION N/A 08/19/2018  ? Procedure: CESAREAN SECTION;  Surgeon: Crawford Givens, MD;  Location: [facility];  Service: Obstetrics;  Laterality: N/A;  ? spinal tap    ? ? ?Family History  ?Problem Relation Age of Onset  ? Emphysema Maternal Grandfather   ? Depression Maternal Grandfather   ? Anxiety disorder Maternal Grandfather   ? Mental illness Maternal Grandfather   ? Diabetes Maternal Grandfather   ? Cancer Paternal Grandmother   ? Non-Hodgkin's lymphoma Paternal Grandmother   ? Sudden death Paternal Grandmother   ? Asthma Mother   ? Hypertension Father   ? Alcohol abuse Maternal Aunt   ? Hyperlipidemia Maternal Aunt   ? ? ?Social History  ? ?Socioeconomic History  ? Marital status: Single  ?  Spouse name: n/a  ? Number of children: 0  ? Years of education: 12+  ? Highest education level: Not on file  ?Occupational History  ? Occupation: Electronics engineer   ?Tobacco Use  ? Smoking status: Former  ? Smokeless tobacco: Never  ?Vaping Use  ? Vaping Use: Never used  ?Substance and Sexual Activity  ? Alcohol use: Not Currently  ?  Alcohol/week: 2.0 standard drinks  ?  Types: 2 Shots of liquor per week  ?  Comment: social  ? Drug use: Not Currently  ?  Types: Other-see comments, Marijuana  ? Sexual activity: Yes  ?  Partners: Male  ?  Birth control/protection: Condom, Implant  ?  Comment: intercourse age 22, more than 5 sexual partners  ?Other Topics Concern  ? Not on file  ?Social History Narrative  ? Graduated from high school 10/2012, currently attending college.  ? Right-handed  ? Caffeine: 3x/wk  ? ?Social Determinants of Health  ? ?Financial Resource Strain: Not on file  ?Food Insecurity: Not on file  ?Transportation Needs: Not on file  ?Physical Activity: Not on file  ?Stress: Not on file  ?Social Connections: Not on file  ?Intimate Partner Violence: Not on file  ? ? ?Outpatient Medications Prior to Visit  ?Medication Sig Dispense Refill  ? acetaminophen (TYLENOL) 325 MG tablet Take 650 mg by mouth every 6 (six) hours as needed for mild pain or moderate pain.    ? cetirizine (ZYRTEC) 10 MG tablet TAKE 1 TABLET BY MOUTH EVERYDAY AT  BEDTIME 30 tablet 0  ? fluticasone (FLONASE) 50 MCG/ACT nasal spray SPRAY 2 SPRAYS INTO EACH NOSTRIL EVERY DAY 16 mL 0  ? levonorgestrel (MIRENA) 20 MCG/24HR IUD 1 each by Intrauterine route once.    ? sulfamethoxazole-trimethoprim (BACTRIM DS) 800-160 MG tablet Take 1 tablet by mouth 2 (two) times daily. 14 tablet 0  ? nitrofurantoin, macrocrystal-monohydrate, (MACROBID) 100 MG capsule Take 1 capsule (100 mg total) by mouth 2 (two) times daily. 10 capsule 0  ? ?No facility-administered medications prior to visit.  ? ? ?Allergies  ?Allergen Reactions  ? Ivp Dye [Iodinated Contrast Media] Itching and Nausea Only  ?  Contrast for MRI  ? ? ?Review of Systems ?See pertinent positives and negatives per HPI. ?   ?Objective:  ?  ?Physical  Exam ?Vitals and nursing note reviewed.  ?Constitutional:   ?   General: She is not in acute distress. ?   Appearance: Normal appearance.  ?HENT:  ?   Head: Normocephalic.  ?   Right Ear: Tympanic membrane, ear canal and external ear normal.  ?   Left Ear: Tympanic membrane, ear canal and external ear normal.  ?Eyes:  ?   Conjunctiva/sclera: Conjunctivae normal.  ?Cardiovascular:  ?   Rate and Rhythm: Normal rate and regular rhythm.  ?   Pulses: Normal pulses.  ?   Heart sounds: Normal heart sounds.  ?Pulmonary:  ?   Effort: Pulmonary effort is normal.  ?   Breath sounds: Normal breath sounds.  ?Abdominal:  ?   Palpations: Abdomen is soft.  ?   Tenderness: There is no abdominal tenderness. There is no right CVA tenderness or left CVA tenderness.  ?Musculoskeletal:     ?   General: Tenderness (Lower back bilateral) present. Normal range of motion.  ?   Cervical back: Normal range of motion. Tenderness present.  ?Lymphadenopathy:  ?   Cervical: Cervical adenopathy (right posterior) present.  ?Skin: ?   General: Skin is warm.  ?Neurological:  ?   General: No focal deficit present.  ?   Mental Status: She is alert and oriented to person, place, and time.  ?Psychiatric:     ?   Mood and Affect: Mood normal.     ?   Behavior: Behavior normal.     ?   Thought Content: Thought content normal.     ?   Judgment: Judgment normal.  ? ? ?BP 118/84 (Cuff Size: Large)   Pulse 90   Temp 97.8 ?F (36.6 ?C) (Temporal)   Wt 259 lb (117.5 kg)   SpO2 97%   BMI 44.11 kg/m?  ?Wt Readings from Last 3 Encounters:  ?08/31/21 259 lb (117.5 kg)  ?07/27/21 257 lb 3.2 oz (116.7 kg)  ?01/26/21 263 lb 6.4 oz (119.5 kg)  ? ? ?There are no preventive care reminders to display for this patient. ? ?There are no preventive care reminders to display for this patient. ? ? ?Lab Results  ?Component Value Date  ? TSH 0.54 01/26/2021  ? ?Lab Results  ?Component Value Date  ? WBC 10.2 01/26/2021  ? HGB 12.9 01/26/2021  ? HCT 39.3 01/26/2021  ? MCV 89.0  01/26/2021  ? PLT 296.0 01/26/2021  ? ?Lab Results  ?Component Value Date  ? NA 140 01/26/2021  ? K 4.1 01/26/2021  ? CO2 25 01/26/2021  ? GLUCOSE 71 01/26/2021  ? BUN 9 01/26/2021  ? CREATININE 1.09 01/26/2021  ? BILITOT 0.5 01/26/2021  ? ALKPHOS 57 01/26/2021  ? AST  16 01/26/2021  ? ALT 14 01/26/2021  ? PROT 7.2 01/26/2021  ? ALBUMIN 4.0 01/26/2021  ? CALCIUM 9.8 01/26/2021  ? ANIONGAP 9 05/31/2019  ? GFR 70.08 01/26/2021  ? ?Lab Results  ?Component Value Date  ? CHOL 156 01/26/2021  ? ?Lab Results  ?Component Value Date  ? HDL 51.20 01/26/2021  ? ?Lab Results  ?Component Value Date  ? [place] 95 01/26/2021  ? ?Lab Results  ?Component Value Date  ? TRIG 45.0 01/26/2021  ? ?Lab Results  ?Component Value Date  ? CHOLHDL 3 01/26/2021  ? ?Lab Results  ?Component Value Date  ? HGBA1C 5.4 07/27/2021  ? ? ?   ?Assessment & Plan:  ? ?Problem List Items Addressed This Visit   ? ?  ? Other  ? Lower abdominal pain  ?  Acute, ongoing.  She is having continued lower abdominal cramping and lower back pain.  She also started having fevers since her last visit and a swollen lymph node in the cervical chain.  With ongoing symptoms, concern for pyelonephritis.  She is able to tolerate liquids and food.  Start Cipro 250 mg twice daily x7 days.  Zofran given as needed nausea.  We will check CMP, CBC today with elevated bilirubin and urine.  Urine was negative for gonorrhea and chlamydia last week.  Encourage fluids.  Work note given.  Follow-up in 1 week. ?  ?  ? Relevant Orders  ? Comp Met (CMET)  ? CBC w/Diff  ? ?Other Visit Diagnoses   ? ? Urinary tract infection symptoms    -  Primary  ? Ongoing, UA checked again today shows 2+ bilirubin, 1+ leukocytes, protein.  Continue Bactrim as urine was sensitive to this.  ? Relevant Orders  ? POCT urinalysis dipstick (Completed)  ? ?  ? ? ? ?Meds ordered this encounter  ?Medications  ? ondansetron (ZOFRAN-ODT) 4 MG disintegrating tablet  ?  Sig: Take 1 tablet (4 mg total) by mouth every 8  (eight) hours as needed for nausea or vomiting.  ?  Dispense:  20 tablet  ?  Refill:  0  ? ciprofloxacin (CIPRO) 500 MG tablet  ?  Sig: Take 1 tablet (500 mg total) by mouth 2 (two) times daily for 7 days.  ?  Dispen

## 2021-08-31 ENCOUNTER — Encounter (HOSPITAL_COMMUNITY): Payer: Self-pay

## 2021-08-31 ENCOUNTER — Encounter: Payer: Self-pay | Admitting: Nurse Practitioner

## 2021-08-31 ENCOUNTER — Emergency Department (HOSPITAL_COMMUNITY)
Admission: EM | Admit: 2021-08-31 | Discharge: 2021-09-01 | Disposition: A | Discharge status: Home / Self Care | Payer: Medicaid Other | Attending: Emergency Medicine | Admitting: Emergency Medicine

## 2021-08-31 ENCOUNTER — Ambulatory Visit: Payer: Medicaid Other | Admitting: Nurse Practitioner

## 2021-08-31 VITALS — BP 118/84 | HR 90 | Temp 97.8°F | Wt 259.0 lb

## 2021-08-31 DIAGNOSIS — K429 Umbilical hernia without obstruction or gangrene: Secondary | ICD-10-CM | POA: Insufficient documentation

## 2021-08-31 DIAGNOSIS — K760 Fatty (change of) liver, not elsewhere classified: Secondary | ICD-10-CM | POA: Insufficient documentation

## 2021-08-31 DIAGNOSIS — K5790 Diverticulosis of intestine, part unspecified, without perforation or abscess without bleeding: Secondary | ICD-10-CM | POA: Diagnosis not present

## 2021-08-31 DIAGNOSIS — R399 Unspecified symptoms and signs involving the genitourinary system: Secondary | ICD-10-CM

## 2021-08-31 DIAGNOSIS — R21 Rash and other nonspecific skin eruption: Secondary | ICD-10-CM | POA: Diagnosis not present

## 2021-08-31 DIAGNOSIS — Z20822 Contact with and (suspected) exposure to covid-19: Secondary | ICD-10-CM | POA: Diagnosis not present

## 2021-08-31 DIAGNOSIS — K5900 Constipation, unspecified: Secondary | ICD-10-CM | POA: Diagnosis not present

## 2021-08-31 DIAGNOSIS — R590 Localized enlarged lymph nodes: Secondary | ICD-10-CM | POA: Diagnosis not present

## 2021-08-31 DIAGNOSIS — M545 Low back pain, unspecified: Secondary | ICD-10-CM | POA: Diagnosis present

## 2021-08-31 DIAGNOSIS — N12 Tubulo-interstitial nephritis, not specified as acute or chronic: Secondary | ICD-10-CM | POA: Insufficient documentation

## 2021-08-31 DIAGNOSIS — R7989 Other specified abnormal findings of blood chemistry: Secondary | ICD-10-CM | POA: Insufficient documentation

## 2021-08-31 DIAGNOSIS — C770 Secondary and unspecified malignant neoplasm of lymph nodes of head, face and neck: Secondary | ICD-10-CM | POA: Diagnosis not present

## 2021-08-31 DIAGNOSIS — R1030 Lower abdominal pain, unspecified: Secondary | ICD-10-CM | POA: Diagnosis not present

## 2021-08-31 DIAGNOSIS — R599 Enlarged lymph nodes, unspecified: Secondary | ICD-10-CM

## 2021-08-31 DIAGNOSIS — R109 Unspecified abdominal pain: Secondary | ICD-10-CM | POA: Diagnosis not present

## 2021-08-31 DIAGNOSIS — N10 Acute tubulo-interstitial nephritis: Secondary | ICD-10-CM | POA: Diagnosis not present

## 2021-08-31 LAB — CBC WITH DIFFERENTIAL/PLATELET
Abs Immature Granulocytes: 0.05 10*3/uL (ref 0.00–0.07)
Basophils Absolute: 0 10*3/uL (ref 0.0–0.1)
Basophils Absolute: 0 10*3/uL (ref 0.0–0.1)
Basophils Relative: 0.2 % (ref 0.0–3.0)
Basophils Relative: 0 %
Eosinophils Absolute: 0.2 10*3/uL (ref 0.0–0.7)
Eosinophils Absolute: 0.3 10*3/uL (ref 0.0–0.5)
Eosinophils Relative: 6 % — ABNORMAL HIGH (ref 0.0–5.0)
Eosinophils Relative: 6 %
HCT: 40 % (ref 36.0–46.0)
HCT: 42.2 % (ref 36.0–46.0)
Hemoglobin: 13.4 g/dL (ref 12.0–15.0)
Hemoglobin: 13.9 g/dL (ref 12.0–15.0)
Immature Granulocytes: 1 %
Lymphocytes Relative: 20 % (ref 12.0–46.0)
Lymphocytes Relative: 34 %
Lymphs Abs: 0.8 10*3/uL (ref 0.7–4.0)
Lymphs Abs: 1.6 10*3/uL (ref 0.7–4.0)
MCH: 30 pg (ref 26.0–34.0)
MCHC: 33.4 g/dL (ref 30.0–36.0)
MCHC: 32.9 g/dL (ref 30.0–36.0)
MCV: 89.6 fl (ref 78.0–100.0)
MCV: 91.1 fL (ref 80.0–100.0)
Monocytes Absolute: 0.4 10*3/uL (ref 0.1–1.0)
Monocytes Absolute: 0.3 10*3/uL (ref 0.1–1.0)
Monocytes Relative: 9.7 % (ref 3.0–12.0)
Monocytes Relative: 7 %
Neutro Abs: 2.7 10*3/uL (ref 1.4–7.7)
Neutro Abs: 2.4 10*3/uL (ref 1.7–7.7)
Neutrophils Relative %: 64.1 % (ref 43.0–77.0)
Neutrophils Relative %: 52 %
Platelets: 224 10*3/uL (ref 150.0–400.0)
Platelets: 221 10*3/uL (ref 150–400)
RBC: 4.46 Mil/uL (ref 3.87–5.11)
RBC: 4.63 MIL/uL (ref 3.87–5.11)
RDW: 11.7 % (ref 11.5–15.5)
RDW: 11.4 % — ABNORMAL LOW (ref 11.5–15.5)
WBC: 4.2 10*3/uL (ref 4.0–10.5)
WBC: 4.7 10*3/uL (ref 4.0–10.5)
nRBC: 0 % (ref 0.0–0.2)

## 2021-08-31 LAB — POCT URINALYSIS DIPSTICK
Bilirubin, UA: 2
Glucose, UA: NEGATIVE
Nitrite, UA: POSITIVE
Protein, UA: POSITIVE — AB
Spec Grav, UA: 1.025 (ref 1.010–1.025)
Urobilinogen, UA: 2 E.U./dL — AB
pH, UA: 5.5 (ref 5.0–8.0)

## 2021-08-31 LAB — COMPREHENSIVE METABOLIC PANEL
ALT: 19 U/L (ref 0–35)
ALT: 27 U/L (ref 0–44)
AST: 27 U/L (ref 0–37)
AST: 36 U/L (ref 15–41)
Albumin: 4.3 g/dL (ref 3.5–5.2)
Albumin: 3.8 g/dL (ref 3.5–5.0)
Alkaline Phosphatase: 53 U/L (ref 39–117)
Alkaline Phosphatase: 51 U/L (ref 38–126)
Anion gap: 5 (ref 5–15)
BUN: 9 mg/dL (ref 6–23)
BUN: 10 mg/dL (ref 6–20)
CO2: 23 mEq/L (ref 19–32)
CO2: 20 mmol/L — ABNORMAL LOW (ref 22–32)
Calcium: 9.1 mg/dL (ref 8.4–10.5)
Calcium: 8.3 mg/dL — ABNORMAL LOW (ref 8.9–10.3)
Chloride: 103 mEq/L (ref 96–112)
Chloride: 109 mmol/L (ref 98–111)
Creatinine, Ser: 1.32 mg/dL — ABNORMAL HIGH (ref 0.40–1.20)
Creatinine, Ser: 1.19 mg/dL — ABNORMAL HIGH (ref 0.44–1.00)
GFR, Estimated: >60 mL/min (ref >60)
GFR: 55.46 mL/min — ABNORMAL LOW (ref >60.00)
Glucose, Bld: 94 mg/dL (ref 70–99)
Glucose, Bld: 92 mg/dL (ref 70–99)
Potassium: 4.7 mEq/L (ref 3.5–5.1)
Potassium: 3.7 mmol/L (ref 3.5–5.1)
Sodium: 135 mEq/L (ref 135–145)
Sodium: 134 mmol/L — ABNORMAL LOW (ref 135–145)
Total Bilirubin: 0.3 mg/dL (ref 0.2–1.2)
Total Bilirubin: 0.3 mg/dL (ref 0.3–1.2)
Total Protein: 7.7 g/dL (ref 6.0–8.3)
Total Protein: 7.6 g/dL (ref 6.5–8.1)

## 2021-08-31 LAB — URINALYSIS, ROUTINE W REFLEX MICROSCOPIC
Bilirubin Urine: NEGATIVE
Glucose, UA: NEGATIVE mg/dL
Hgb urine dipstick: NEGATIVE
Ketones, ur: 20 mg/dL — AB
Leukocytes,Ua: NEGATIVE
Nitrite: POSITIVE — AB
Protein, ur: NEGATIVE mg/dL
Specific Gravity, Urine: 1.023 (ref 1.005–1.030)
pH: 5 (ref 5.0–8.0)

## 2021-08-31 LAB — I-STAT BETA HCG BLOOD, ED (MC, WL, AP ONLY): I-stat hCG, quantitative: <5 m[IU]/mL (ref <5)

## 2021-08-31 LAB — LIPASE, BLOOD: Lipase: 26 U/L (ref 11–51)

## 2021-08-31 MED ORDER — ONDANSETRON 4 MG PO TBDP
4.0000 mg | ORAL_TABLET | Freq: Three times a day (TID) | ORAL | 0 refills | Status: DC | PRN
Start: 2021-08-31 — End: 2021-09-01

## 2021-08-31 MED ORDER — CIPROFLOXACIN HCL 500 MG PO TABS: 500.0000 mg | ORAL_TABLET | Freq: Two times a day (BID) | ORAL | 0 refills | Status: DC

## 2021-08-31 NOTE — Assessment & Plan Note (Signed)
Acute, ongoing.  She is having continued lower abdominal cramping and lower back pain.  She also started having fevers since her last visit and a swollen lymph node in the cervical chain.  With ongoing symptoms, concern for pyelonephritis.  She is able to tolerate liquids and food.  Start Cipro 250 mg twice daily x7 days.  Zofran given as needed nausea.  We will check CMP, CBC today with elevated bilirubin and urine.  Urine was negative for gonorrhea and chlamydia last week.  Encourage fluids.  Work note given.  Follow-up in 1 week. ?

## 2021-08-31 NOTE — ED Provider Notes (Signed)
[facility] ?Provider Note ? ? ?CSN: [id number] ?Arrival date & time: 08/31/21  2110 ? ?  ? ?History ? ?Chief Complaint  ?Patient presents with  ? Urinary Tract Infection  ? ? ?Stacy Mills is a 26 y.o. female with a history of PID, migraines, anxiety, and depression who presents to the ED with persistent lower back pain. Patient reports she developed dysuria, suprapubic pain, and back pain 03/26, seen by PCP 03/27- diagnosed with UTI & placed on macrobid which she took as prescribed. Was called and told that her urine infection was not sensitive to this and was switched to a 7 day course of BID bactrim 03/30. Her urinary sxs have resolved however she remains with supapubic pain, significant back pain, as well as nausea. Today she saw her PCP who started her on ciprofloxacin and gave her PRN zofran. She noted that after this she developed a lymph node in her neck and spiked a fever of 102.9. Also noted a rash development this evening- only new meds/products were zofran & ciprofloxacin. Given her constellation of sxs she was concerned for a kidney infection therefore she presented to the ED. She denies vaginal bleeding, vaginal discharge, syncope, chest pain, cough, or dyspnea. She is sexually active in a monogamous relationship, uses protection, no concern for STD. Denies hx of IVDU.  ? ? ?HPI ? ?  ? ?Home Medications ?Prior to Admission medications   ?Medication Sig Start Date End Date Taking? Authorizing Provider  ?acetaminophen (TYLENOL) 325 MG tablet Take 650 mg by mouth every 6 (six) hours as needed for mild pain or moderate pain.    [provider]  ?cetirizine (ZYRTEC) 10 MG tablet TAKE 1 TABLET BY MOUTH EVERYDAY AT BEDTIME 01/22/21   Nche, Bonna Gains, NP  ?ciprofloxacin (CIPRO) 500 MG tablet Take 1 tablet (500 mg total) by mouth 2 (two) times daily for 7 days. 08/31/21 09/07/21  McElwee, Jake Church, NP  ?fluticasone (FLONASE) 50 MCG/ACT nasal spray SPRAY 2 SPRAYS INTO  EACH NOSTRIL EVERY DAY 01/22/21   Nche, Bonna Gains, NP  ?levonorgestrel (MIRENA) 20 MCG/24HR IUD 1 each by Intrauterine route once.    [provider]  ?ondansetron (ZOFRAN-ODT) 4 MG disintegrating tablet Take 1 tablet (4 mg total) by mouth every 8 (eight) hours as needed for nausea or vomiting. 08/31/21   McElwee, Jake Church, NP  ?sulfamethoxazole-trimethoprim (BACTRIM DS) 800-160 MG tablet Take 1 tablet by mouth 2 (two) times daily. 08/26/21   Gerre Scull, NP  ?   ? ?Allergies    ?Ivp dye [iodinated contrast media]   ? ?Review of Systems   ?Review of Systems  ?Constitutional:  Positive for fever.  ?HENT:  Positive for congestion.   ?Respiratory:  Negative for cough and shortness of breath.   ?Cardiovascular:  Negative for chest pain.  ?Gastrointestinal:  Positive for abdominal pain and nausea. Negative for blood in stool, diarrhea and vomiting.  ?Genitourinary:  Positive for dysuria (resolved) and hematuria (resolved). Negative for vaginal bleeding and vaginal discharge.  ?Neurological:  Negative for syncope.  ?All other systems reviewed and are negative. ? ?Physical Exam ?Updated Vital Signs ?BP (!) 137/94 (BP Location: Left Arm)   Pulse 78   Temp 99.1 ?F (37.3 ?C) (Oral)   Resp 16   SpO2 100%  ?Physical Exam ?Vitals and nursing note reviewed.  ?Constitutional:   ?   General: She is not in acute distress. ?   Appearance: She is well-developed. She is not toxic-appearing.  ?  HENT:  ?   Head: Normocephalic and atraumatic.  ?Eyes:  ?   General:     ?   Right eye: No discharge.     ?   Left eye: No discharge.  ?   Conjunctiva/sclera: Conjunctivae normal.  ?Neck:  ?   Comments: Right sided palpable occipital lymph node- no overlying skin changes, mobile.  ?Cardiovascular:  ?   Rate and Rhythm: Normal rate and regular rhythm.  ?Pulmonary:  ?   Effort: No respiratory distress.  ?   Breath sounds: Normal breath sounds. No wheezing or rales.  ?Abdominal:  ?   General: There is no distension.  ?    Palpations: Abdomen is soft.  ?   Tenderness: There is abdominal tenderness (suprapubic). There is no guarding or rebound.  ?Genitourinary: ?   Comments: NT present as chaperone.  ?Minimal discharge present.  ?IUD strings visible. ?No significant friability.  ?No CMT or adnexal tenderness.  ?Musculoskeletal:  ?   Cervical back: Neck supple.  ?   Comments: Diffuse lumbar tenderness. No point/focal vertebral tenderness or step off.   ?Skin: ?   General: Skin is warm and dry.  ?   Findings: Rash (scattered to extremities primarily upper, some urticarial some erythemetaous and papular) present. No petechiae. Rash is not nodular, purpuric, pustular, scaling or vesicular.  ?   Comments: No sloughing of the skin. No MM rash.   ?Neurological:  ?   Mental Status: She is alert.  ?   Comments: Clear speech.  Sensation grossly tact bilateral lower extremities.  5 out of 5 symmetric strength with plantar and dorsiflexion bilaterally.  The patient is ambulatory.    ?Psychiatric:     ?   Behavior: Behavior normal.  ? ? ?ED Results / Procedures / Treatments   ?Labs ?(all labs ordered are listed, but only abnormal results are displayed) ?Labs Reviewed  ?CBC WITH DIFFERENTIAL/PLATELET - Abnormal; Notable for the following components:  ?    Result Value  ? RDW 11.4 (*)   ? All other components within normal limits  ?COMPREHENSIVE METABOLIC PANEL - Abnormal; Notable for the following components:  ? Sodium 134 (*)   ? CO2 20 (*)   ? Creatinine, Ser 1.19 (*)   ? Calcium 8.3 (*)   ? All other components within normal limits  ?URINALYSIS, ROUTINE W REFLEX MICROSCOPIC - Abnormal; Notable for the following components:  ? Color, Urine AMBER (*)   ? APPearance HAZY (*)   ? Ketones, ur 20 (*)   ? Nitrite POSITIVE (*)   ? Bacteria, UA RARE (*)   ? All other components within normal limits  ?LIPASE, BLOOD  ?I-STAT BETA HCG BLOOD, ED ([facility], [facility], [facility] ONLY)  ? ? ?EKG ?None ? ?Radiology ?CT Abdomen Pelvis Wo Contrast ? ?Result Date: 09/01/2021 ?CLINICAL  DATA:  Radiating lower abdominal pain with nausea. UTI diagnosed last week. History of PID and cholecystectomy. EXAM: CT ABDOMEN AND PELVIS WITHOUT CONTRAST TECHNIQUE: Multidetector CT imaging of the abdomen and pelvis was performed following the standard protocol without IV contrast. RADIATION DOSE REDUCTION: This exam was performed according to the departmental dose-optimization program which includes automated exposure control, adjustment of the mA and/or kV according to patient size and/or use of iterative reconstruction technique. COMPARISON:  CT with IV contrast 05/30/2019 FINDINGS: Lower chest: No acute abnormality. Hepatobiliary: Interval cholecystectomy. Interval removal of percutaneously inserted biliary drainage catheter. Interval resolution of 3 cm collection in the posterior segment of the right lobe of  the liver. The common bile duct is mildly prominent measuring 7.5 mm but this was also seen previously. There is no intrahepatic biliary prominence. The liver is mildly steatotic and otherwise unremarkable without contrast. Pancreas: Unremarkable without contrast. Spleen: Normal in size, unremarkable without contrast. Adrenals/Urinary Tract: There is no adrenal mass, no focal abnormality in the unenhanced renal cortex and no urinary stone or obstruction. The bladder is contracted and not well seen. Stomach/Bowel: Unremarkable stomach, unopacified small bowel. The appendix is normal caliber. There is mild-to-moderate fecal stasis in the flexures and transverse colon. There is no colonic thickening. There are few uncomplicated sigmoid diverticula. Vascular/Lymphatic: No significant vascular findings are present. No enlarged abdominal or pelvic lymph nodes. There are bilateral slightly prominent inguinal lymph nodes up to 1.1 cm in short axis. These were previously smaller. Reproductive: The uterus and ovaries are normal in size. IUD is again low in positioning within the uterine cavity but unchanged.  Other: Small umbilical fat hernia. No free air, hemorrhage or fluid. Musculoskeletal: No acute or significant osseous findings. IMPRESSION: 1. No acute noncontrast CT abnormalities. 2. Mildly prominent common bile duct w

## 2021-08-31 NOTE — Patient Instructions (Signed)
It was great to see you! ? ?Start cipro antibiotic twice a day for 7 days. Drink plenty of fluids. I am sending in some nausea medicine to take as needed.  ? ?Let's follow-up in 1 week, sooner if you have concerns. ? ?If a referral was placed today, you will be contacted for an appointment. Please note that routine referrals can sometimes take up to 3-4 weeks to process. Please call our office if you haven't heard anything after this time frame. ? ?Take care, ? ?Rodman Pickle, NP ? ?

## 2021-08-31 NOTE — ED Triage Notes (Signed)
Patient states she was diagnosed last week with a UTI and now having lower back pain and nausea.  ?

## 2021-09-01 ENCOUNTER — Encounter: Payer: Self-pay | Admitting: Nurse Practitioner

## 2021-09-01 ENCOUNTER — Emergency Department (HOSPITAL_COMMUNITY): Payer: Medicaid Other

## 2021-09-01 ENCOUNTER — Telehealth: Payer: Self-pay | Admitting: Nurse Practitioner

## 2021-09-01 DIAGNOSIS — R109 Unspecified abdominal pain: Secondary | ICD-10-CM | POA: Diagnosis not present

## 2021-09-01 LAB — GC/CHLAMYDIA PROBE AMP (~~LOC~~) NOT AT ARMC
Chlamydia: NEGATIVE
Comment: NEGATIVE
Comment: NORMAL
Neisseria Gonorrhea: NEGATIVE

## 2021-09-01 LAB — WET PREP, GENITAL
Clue Cells Wet Prep HPF POC: NONE SEEN
Sperm: NONE SEEN
Trich, Wet Prep: NONE SEEN
WBC, Wet Prep HPF POC: <10 (ref <10)
Yeast Wet Prep HPF POC: NONE SEEN

## 2021-09-01 LAB — RESP PANEL BY RT-PCR (FLU A&B, COVID) ARPGX2
Influenza A by PCR: NEGATIVE
Influenza B by PCR: NEGATIVE
SARS Coronavirus 2 by RT PCR: NEGATIVE

## 2021-09-01 LAB — RPR
RPR Ser Ql: REACTIVE — AB
RPR Titer: 1:1 {titer}

## 2021-09-01 LAB — HIV ANTIBODY (ROUTINE TESTING W REFLEX): HIV Screen 4th Generation wRfx: NONREACTIVE

## 2021-09-01 LAB — MONONUCLEOSIS SCREEN: Mono Screen: NEGATIVE

## 2021-09-01 MED ORDER — LIDOCAINE 5 % EX PTCH
2.0000 | MEDICATED_PATCH | CUTANEOUS | Status: DC
  Administered 2021-09-01: 2 via TRANSDERMAL
  Filled 2021-09-01: qty 2

## 2021-09-01 MED ORDER — DIPHENHYDRAMINE HCL 25 MG PO TABS: 25.0000 mg | ORAL_TABLET | Freq: Four times a day (QID) | ORAL | 0 refills | Status: DC | PRN

## 2021-09-01 MED ORDER — SODIUM CHLORIDE 0.9 % IV BOLUS
1000.0000 mL | Freq: Once | INTRAVENOUS | Status: AC
  Administered 2021-09-01: 1000 mL via INTRAVENOUS

## 2021-09-01 MED ORDER — FAMOTIDINE 20 MG PO TABS: 20.0000 mg | ORAL_TABLET | Freq: Two times a day (BID) | ORAL | 0 refills | Status: DC | PRN

## 2021-09-01 MED ORDER — FENTANYL CITRATE PF 50 MCG/ML IJ SOSY
50.0000 ug | PREFILLED_SYRINGE | Freq: Once | INTRAMUSCULAR | Status: AC
  Administered 2021-09-01: 50 ug via INTRAVENOUS
  Filled 2021-09-01: qty 1

## 2021-09-01 MED ORDER — PROMETHAZINE HCL 25 MG PO TABS: 25.0000 mg | ORAL_TABLET | Freq: Three times a day (TID) | ORAL | 0 refills | Status: DC | PRN

## 2021-09-01 MED ORDER — DIPHENHYDRAMINE HCL 50 MG/ML IJ SOLN
25.0000 mg | Freq: Once | INTRAMUSCULAR | Status: AC
Start: 2021-09-01 — End: 2021-09-01
  Administered 2021-09-01: 25 mg via INTRAVENOUS
  Filled 2021-09-01: qty 1

## 2021-09-01 MED ORDER — ACETAMINOPHEN 325 MG PO TABS
650.0000 mg | ORAL_TABLET | Freq: Once | ORAL | Status: AC
  Administered 2021-09-01: 650 mg via ORAL
  Filled 2021-09-01: qty 2

## 2021-09-01 MED ORDER — LIDOCAINE 5 % EX PTCH: 1.0000 | MEDICATED_PATCH | Freq: Every day | CUTANEOUS | 0 refills | Status: DC | PRN

## 2021-09-01 MED ORDER — PREDNISONE 50 MG PO TABS: 50.0000 mg | ORAL_TABLET | Freq: Every day | ORAL | 0 refills | Status: DC

## 2021-09-01 MED ORDER — METHYLPREDNISOLONE SODIUM SUCC 125 MG IJ SOLR
125.0000 mg | Freq: Once | INTRAMUSCULAR | Status: AC
  Administered 2021-09-01: 125 mg via INTRAVENOUS
  Filled 2021-09-01: qty 2

## 2021-09-01 MED ORDER — SODIUM CHLORIDE 0.9 % IV SOLN
1.0000 g | Freq: Once | INTRAVENOUS | Status: AC
  Administered 2021-09-01: 1 g via INTRAVENOUS
  Filled 2021-09-01: qty 10

## 2021-09-01 MED ORDER — FAMOTIDINE IN NACL 20-0.9 MG/50ML-% IV SOLN
20.0000 mg | Freq: Once | INTRAVENOUS | Status: AC
Start: 2021-09-01 — End: 2021-09-01
  Administered 2021-09-01: 20 mg via INTRAVENOUS
  Filled 2021-09-01: qty 50

## 2021-09-01 MED ORDER — CEFPODOXIME PROXETIL 200 MG PO TABS
200.0000 mg | ORAL_TABLET | Freq: Two times a day (BID) | ORAL | 0 refills | Status: DC
Start: 2021-09-01 — End: 2021-09-03

## 2021-09-01 MED ORDER — HYDROXYZINE HCL 25 MG PO TABS: 25.0000 mg | ORAL_TABLET | Freq: Three times a day (TID) | ORAL | 0 refills | Status: DC | PRN

## 2021-09-01 NOTE — Discharge Instructions (Addendum)
You were seen in the emergency department today for fever and back pain.  Your blood work was overall reassuring-your creatinine which shows that kidney function was very mildly elevated, please have this rechecked by primary care provider.  We sent multiple test to further evaluate your lymph nodes including COVID, flu, mononucleosis, HIV, and syphilis-we will call you if these results are abnormal. ? ?We have sent your urine for culture.  Please stop taking ciprofloxacin as this may have caused you to have your rash. ? ?We are starting you on the following medication: ?- Cefpodoxime: This is an antibiotic, please take this twice per day for the next 10 days taking your first dose this evening. ?- Atarax- Take every 8 hours as needed for itching/rash.  ?- Prednisone: This is a steroid, please take this for the next 3 days starting tomorrow to help with your rash. ?- Lidoderm patch: Apply 1 patch to your area of most significant pain, remove and discard within 12 hours of application. Do not put heat over this.  ? ?We have prescribed you new medication(s) today. Discuss the medications prescribed today with your pharmacist as they can have adverse effects and interactions with your other medicines including over the counter and prescribed medications. Seek medical evaluation if you start to experience new or abnormal symptoms after taking one of these medicines, seek care immediately if you start to experience difficulty breathing, feeling of your throat closing, facial swelling, or rash as these could be indications of a more serious allergic reaction ? ?Your CT scan showed findings similar to previous in terms of your gallbladder and liver, of note you did have some lymph nodes in the groin area.  As we discussed it is very important that you discuss your multiple lymph nodes with your primary care provider as they may need to do some further testing. ? ?Please follow-up with primary care within 3 days.  Return to  the emergency department for any new or worsening symptoms including but not limited to new or worsening pain, fever after 48 hours of antibiotics, inability to keep fluids down, passing out,new or worsening rash, rash to your mucous membranes/in your mouth or any other concerns. ?

## 2021-09-01 NOTE — Telephone Encounter (Signed)
Pt was at the ED last night and was prescribed 4 meds, 2 need PA, she was told that we have to do these PA's.   ?cefpodoxime (VANTIN) 200 MG tablet [id number]  ?lidocaine (LIDODERM) 5 % [id number]  ?Pharmacy  ?[id number] [address]  ?[place], [place] [address]  ?Phone:  [phone]  Fax:  [phone]  ?DEA #:  [id number] ?

## 2021-09-02 ENCOUNTER — Ambulatory Visit
Admission: EM | Admit: 2021-09-02 | Discharge: 2021-09-02 | Disposition: A | Discharge status: Home / Self Care | Payer: Medicaid Other

## 2021-09-02 ENCOUNTER — Telehealth: Payer: Self-pay

## 2021-09-02 ENCOUNTER — Encounter: Payer: Self-pay | Admitting: Nurse Practitioner

## 2021-09-02 ENCOUNTER — Other Ambulatory Visit: Payer: Self-pay | Admitting: Nurse Practitioner

## 2021-09-02 DIAGNOSIS — L048 Acute lymphadenitis of other sites: Secondary | ICD-10-CM

## 2021-09-02 DIAGNOSIS — A530 Latent syphilis, unspecified as early or late: Secondary | ICD-10-CM

## 2021-09-02 LAB — URINE CULTURE: Culture: 50000 — AB

## 2021-09-02 LAB — T.PALLIDUM AB, TOTAL: T Pallidum Abs: NONREACTIVE

## 2021-09-02 MED ORDER — DOXYCYCLINE HYCLATE 100 MG PO TABS: 100.0000 mg | ORAL_TABLET | Freq: Two times a day (BID) | ORAL | 0 refills | Status: DC

## 2021-09-02 NOTE — Addendum Note (Signed)
Addended by: Alysia Penna L on: 09/02/2021 12:30 PM ? ? Modules accepted: Orders ? ?

## 2021-09-02 NOTE — Telephone Encounter (Signed)
Transition Care Management Follow-up Telephone Call ?Date of discharge and from where: 09/01/2021 from [facility] ?How have you been since you were released from the hospital? Patient stated that she is gelling okay. Patient stated that she has not been able to start the Vantin rx'ed by the ED. I did inform patient, per phone notes, that the [facility] is requiring a PA. Also stated that PCP may not want to keep her on the Vantin and that PCP may address a different way. PCP contact via secure chat and phone is being routed.  ?Any questions or concerns? No ? ?Items Reviewed: ?Did the pt receive and understand the discharge instructions provided? Yes  ?Medications obtained and verified? Yes  ?Other? No  ?Any new allergies since your discharge? No  ?Dietary orders reviewed? No ?Do you have support at home? Yes  ? ?Functional Questionnaire: (I = Independent and D = Dependent) ?ADLs: I ? ?Bathing/Dressing- I ? ?Meal Prep- I ? ?Eating- I ? ?Maintaining continence- I ? ?Transferring/Ambulation- I ? ?Managing Meds- I ? ? ?Follow up appointments reviewed: ? ?PCP Hospital f/u appt confirmed? No   ?Specialist Hospital f/u appt confirmed? No   ?Are transportation arrangements needed? No  ?If their condition worsens, is the pt aware to call PCP or go to the Emergency Dept.? Yes ?Was the patient provided with contact information for the PCP's office or ED? Yes ?Was to pt encouraged to call back with questions or concerns? Yes ? ?

## 2021-09-02 NOTE — ED Notes (Addendum)
Pt st she was seen virtually today and never received her prescription at the pharmacy.  ?Pharmacy contacted and rx not received from provider.  ?Providers office called  and np st she would re order the medication for patient  ?

## 2021-09-02 NOTE — Telephone Encounter (Signed)
Called and confirmed with patient that her pcp sent in antibiotic to her preferred pharmacy on file today. Advised patient to check with pharmacy to make sure they have it in stock an filled. Sw, cma ?

## 2021-09-03 ENCOUNTER — Telehealth: Payer: Self-pay | Admitting: Emergency Medicine

## 2021-09-03 ENCOUNTER — Encounter: Payer: Self-pay | Admitting: Nurse Practitioner

## 2021-09-03 ENCOUNTER — Other Ambulatory Visit: Payer: Self-pay | Admitting: Nurse Practitioner

## 2021-09-03 DIAGNOSIS — A530 Latent syphilis, unspecified as early or late: Secondary | ICD-10-CM

## 2021-09-03 DIAGNOSIS — L048 Acute lymphadenitis of other sites: Secondary | ICD-10-CM

## 2021-09-03 MED ORDER — DOXYCYCLINE HYCLATE 100 MG PO TABS: 100.0000 mg | ORAL_TABLET | Freq: Two times a day (BID) | ORAL | 0 refills | Status: DC

## 2021-09-03 NOTE — Telephone Encounter (Signed)
Post ED Visit - Positive Culture Follow-up ? ?Culture report reviewed by antimicrobial stewardship pharmacist: ?[facility] ?[]  , Enzo Bi.D. ?[]  [address], Pharm.D., BCPS AQ-ID ?[]  , Pharm.D., BCPS ?[]  Celedonio Miyamoto, .D., BCPS ?[]  [place], .D., BCPS, AAHIVP ?[]  Georgina Pillion, Pharm.D., BCPS, AAHIVP ?[]  [address], PharmD, BCPS ?[]  , PharmD, BCPS ?[]  [place], PharmD, BCPS ?[]  [address], PharmD ?[]  , PharmD, BCPS ?[]  Estella Husk, PharmD ? ? [facility] ?[]  Lysle Pearl, PharmD ?[]  , PharmD ?[]  Phillips Climes, PharmD ?[]  , Rph ?[]  Agapito Games) , PharmD ?[]  Verlan Friends, PharmD ?[]  , PharmD ?[]  Mervyn Gay, PharmD ?[]  , PharmD ?[]  Vinnie Level, PharmD ?[]  Gerri Spore, PharmD ?[]  , PharmD ?[x]  Len Childs, PharmD ? ? ?Positive urine culture ?Treated with Cefpodoxime, organism sensitive to the same and no further patient follow-up is required at this time. ? ? ?09/03/2021, 1:55 PM ?  ?

## 2021-09-08 ENCOUNTER — Encounter: Payer: Self-pay | Admitting: Nurse Practitioner

## 2021-09-08 NOTE — Telephone Encounter (Signed)
Will address at appointment on 09/09/21 ?

## 2021-09-09 ENCOUNTER — Encounter: Payer: Self-pay | Admitting: Nurse Practitioner

## 2021-09-09 ENCOUNTER — Ambulatory Visit: Payer: Medicaid Other | Admitting: Nurse Practitioner

## 2021-09-09 ENCOUNTER — Telehealth: Payer: Self-pay | Admitting: Hematology and Oncology

## 2021-09-09 VITALS — BP 124/78 | HR 98 | Temp 97.6°F | Ht 64.25 in | Wt 261.2 lb

## 2021-09-09 DIAGNOSIS — A530 Latent syphilis, unspecified as early or late: Secondary | ICD-10-CM | POA: Insufficient documentation

## 2021-09-09 DIAGNOSIS — Z807 Family history of other malignant neoplasms of lymphoid, hematopoietic and related tissues: Secondary | ICD-10-CM | POA: Diagnosis not present

## 2021-09-09 DIAGNOSIS — R591 Generalized enlarged lymph nodes: Secondary | ICD-10-CM | POA: Diagnosis not present

## 2021-09-09 NOTE — Assessment & Plan Note (Addendum)
Improving with doxycycline ?Reports nausea and vomiting due to taking oral abx on empty stomach. ?Mild right inguinal tenderness, but no palpable lymph node. ?Positive RPR, but negative T. Pallidum antibody on 09/01/2021 ?Last sexual encounter 3/37/2023. ?Other negative labs 04/04 and 09/01/21: mononucleosis, HIV, urinalysis, GC/chlamydia, trichomoniasis, cbc, lipase, CMP. ?CT ABD/pelvis 08/2021: No acute noncontrast CT abnormalities. Mildly prominent common bile duct with interval cholecystectomy, but similar to prior study before cholecystectomy. Laboratory and ?clinical correlation suggested. Mild hepatic steatosis. Constipation.  Uncomplicated diverticulosis. ?IUD positioned low in the uterine cavity but unchanged. Umbilical fat hernia.Bilateral mildly prominent inguinal chain lymph nodes, nonspecific. No bulky or encasing adenopathy. ? ?Repeat cbc, RPR and T. Pallidum antibody in 1week ?Complete doxycycline ? ?

## 2021-09-09 NOTE — Telephone Encounter (Signed)
Scheduled appt per 4/13 referral. Pt is aware of appt date and time. Pt is aware to arrive 15 mins prior to appt time and to bring and updated insurance card. Pt is aware of appt location.   ?

## 2021-09-09 NOTE — Patient Instructions (Signed)
Schedule lab appt in 1week. ?You will be contacted to schedule appt with hematology. ?

## 2021-09-09 NOTE — Assessment & Plan Note (Signed)
Entered referral to hematology 

## 2021-09-09 NOTE — Progress Notes (Signed)
? ?             Established Patient Visit ? ?Patient: Stacy Mills   DOB: June 13, 1995   26 y.o. Female  MRN: [id number] ?Visit Date: 09/09/2021 ? ?Subjective:  ?  ?Chief Complaint  ?Patient presents with  ? Follow-up  ?  1 week f/u on abdominal pain and uti.  ?Pt states she has not had any symptoms since taking medication.  ?Pt recently had a CT and would like to discuss.   ? ?HPI ? ?Accompanied by Grandmother. ? ?Family history of non-Hodgkin's lymphoma ?Entered referral to hematology ? ?Lymphadenopathy ?Improving with doxycycline ?Reports nausea and vomiting due to taking oral abx on empty stomach. ?Mild right inguinal tenderness, but no palpable lymph node. ?Positive RPR, but negative T. Pallidum antibody on 09/01/2021 ?Last sexual encounter 3/37/2023. ?Other negative labs 04/04 and 09/01/21: mononucleosis, HIV, urinalysis, GC/chlamydia, trichomoniasis, cbc, lipase, CMP. ?CT ABD/pelvis 08/2021: No acute noncontrast CT abnormalities. Mildly prominent common bile duct with interval cholecystectomy, but similar to prior study before cholecystectomy. Laboratory and ?clinical correlation suggested. Mild hepatic steatosis. Constipation.  Uncomplicated diverticulosis. ?IUD positioned low in the uterine cavity but unchanged. Umbilical fat hernia.Bilateral mildly prominent inguinal chain lymph nodes, nonspecific. No bulky or encasing adenopathy. ? ?Repeat cbc, RPR and T. Pallidum antibody in 1week ?Complete doxycycline ? ?Reviewed medical, surgical, and social history today ? ?Medications: ?Outpatient Medications Prior to Visit  ?Medication Sig  ? acetaminophen (TYLENOL) 325 MG tablet Take 650 mg by mouth every 6 (six) hours as needed for mild pain or moderate pain.  ? cetirizine (ZYRTEC) 10 MG tablet TAKE 1 TABLET BY MOUTH EVERYDAY AT BEDTIME  ? doxycycline (VIBRA-TABS) 100 MG tablet Take 1 tablet (100 mg total) by mouth 2 (two) times daily.  ? fluticasone (FLONASE) 50 MCG/ACT nasal spray SPRAY 2 SPRAYS INTO EACH NOSTRIL  EVERY DAY  ? hydrOXYzine (ATARAX) 25 MG tablet Take 1 tablet (25 mg total) by mouth every 8 (eight) hours as needed.  ? levonorgestrel (MIRENA) 20 MCG/24HR IUD 1 each by Intrauterine route once.  ? predniSONE (DELTASONE) 50 MG tablet Take 1 tablet (50 mg total) by mouth daily with breakfast.  ? ?No facility-administered medications prior to visit.  ? ?Reviewed past medical and social history.  ? ?ROS per HPI above ? ? ?   ?Objective:  ?BP 124/78 (BP Location: Right Arm, Patient Position: Sitting, Cuff Size: Large)   Pulse 98   Temp 97.6 ?F (36.4 ?C) (Temporal)   Ht 5' 4.25" (1.632 m)   Wt 261 lb 3.2 oz (118.5 kg)   SpO2 98%   BMI 44.49 kg/m?  ? ?  ? ?Physical Exam ?Neck:  ?   Thyroid: No thyroid mass, thyromegaly or thyroid tenderness.  ?Abdominal:  ?   Hernia: There is no hernia in the left inguinal area or right inguinal area.  ?Musculoskeletal:  ?   Cervical back: Normal range of motion.  ?Lymphadenopathy:  ?   Cervical: No cervical adenopathy.  ?   Upper Body:  ?   Right upper body: No supraclavicular, axillary or pectoral adenopathy.  ?   Left upper body: No supraclavicular, axillary or pectoral adenopathy.  ?   Lower Body: Right inguinal adenopathy present. No left inguinal adenopathy.  ?Neurological:  ?   Mental Status: She is oriented to person, place, and time.  ?  ?No results found for any visits on 09/09/21. ?   ?Assessment & Plan:  ?  ?Problem List Items Addressed This Visit   ? ?  ?  Immune and Lymphatic  ? Lymphadenopathy - Primary  ?  Improving with doxycycline ?Reports nausea and vomiting due to taking oral abx on empty stomach. ?Mild right inguinal tenderness, but no palpable lymph node. ?Positive RPR, but negative T. Pallidum antibody on 09/01/2021 ?Last sexual encounter 3/37/2023. ?Other negative labs 04/04 and 09/01/21: mononucleosis, HIV, urinalysis, GC/chlamydia, trichomoniasis, cbc, lipase, CMP. ?CT ABD/pelvis 08/2021: No acute noncontrast CT abnormalities. Mildly prominent common bile duct  with interval cholecystectomy, but similar to prior study before cholecystectomy. Laboratory and ?clinical correlation suggested. Mild hepatic steatosis. Constipation.  Uncomplicated diverticulosis. ?IUD positioned low in the uterine cavity but unchanged. Umbilical fat hernia.Bilateral mildly prominent inguinal chain lymph nodes, nonspecific. No bulky or encasing adenopathy. ? ?Repeat cbc, RPR and T. Pallidum antibody in 1week ?Complete doxycycline ? ?  ?  ? Relevant Orders  ? Ambulatory referral to Hematology / Oncology  ?  ? Other  ? Family history of non-Hodgkin's lymphoma  ?  Entered referral to hematology ?  ?  ? Relevant Orders  ? Ambulatory referral to Hematology / Oncology  ? Positive RPR test  ? Relevant Orders  ? T.pallidum Ab, Total  ? RPR  ? CBC with Differential/Platelet  ? ?Return if symptoms worsen or fail to improve. ? ?  ? ?Wilfred Lacy, NP ? ? ?

## 2021-09-16 ENCOUNTER — Other Ambulatory Visit (INDEPENDENT_AMBULATORY_CARE_PROVIDER_SITE_OTHER): Payer: Medicaid Other

## 2021-09-16 DIAGNOSIS — A530 Latent syphilis, unspecified as early or late: Secondary | ICD-10-CM

## 2021-09-16 LAB — CBC WITH DIFFERENTIAL/PLATELET
Basophils Absolute: 0 10*3/uL (ref 0.0–0.1)
Basophils Relative: 0.6 % (ref 0.0–3.0)
Eosinophils Absolute: 0.1 10*3/uL (ref 0.0–0.7)
Eosinophils Relative: 0.8 % (ref 0.0–5.0)
HCT: 39 % (ref 36.0–46.0)
Hemoglobin: 12.6 g/dL (ref 12.0–15.0)
Lymphocytes Relative: 35.3 % (ref 12.0–46.0)
Lymphs Abs: 2.9 10*3/uL (ref 0.7–4.0)
MCHC: 32.4 g/dL (ref 30.0–36.0)
MCV: 91.8 fl (ref 78.0–100.0)
Monocytes Absolute: 0.7 10*3/uL (ref 0.1–1.0)
Monocytes Relative: 8.4 % (ref 3.0–12.0)
Neutro Abs: 4.5 10*3/uL (ref 1.4–7.7)
Neutrophils Relative %: 54.9 % (ref 43.0–77.0)
Platelets: 327 10*3/uL (ref 150.0–400.0)
RBC: 4.25 Mil/uL (ref 3.87–5.11)
RDW: 12.5 % (ref 11.5–15.5)
WBC: 8.1 10*3/uL (ref 4.0–10.5)

## 2021-09-16 NOTE — Progress Notes (Signed)
Per the orders of  Charlotte Nche NP pt is here for labs, pt tolerated draw well. ? ?

## 2021-09-21 LAB — RPR: RPR Ser Ql: NONREACTIVE

## 2021-09-21 LAB — T.PALLIDUM AB, TOTAL: T pallidum Antibodies (TP-PA): NONREACTIVE

## 2021-09-23 ENCOUNTER — Inpatient Hospital Stay: Payer: Medicaid Other | Attending: Hematology and Oncology | Admitting: Hematology and Oncology

## 2021-09-23 ENCOUNTER — Inpatient Hospital Stay: Payer: Medicaid Other

## 2021-09-23 ENCOUNTER — Other Ambulatory Visit: Payer: Self-pay

## 2021-09-23 VITALS — BP 122/99 | HR 82 | Temp 98.1°F | Resp 16 | Wt 261.8 lb

## 2021-09-23 DIAGNOSIS — R590 Localized enlarged lymph nodes: Secondary | ICD-10-CM

## 2021-09-23 DIAGNOSIS — Z809 Family history of malignant neoplasm, unspecified: Secondary | ICD-10-CM | POA: Diagnosis not present

## 2021-09-23 DIAGNOSIS — R591 Generalized enlarged lymph nodes: Secondary | ICD-10-CM

## 2021-09-23 DIAGNOSIS — Z87891 Personal history of nicotine dependence: Secondary | ICD-10-CM

## 2021-09-23 LAB — CMP (CANCER CENTER ONLY)
ALT: 12 U/L (ref 0–44)
AST: 15 U/L (ref 15–41)
Albumin: 3.9 g/dL (ref 3.5–5.0)
Alkaline Phosphatase: 53 U/L (ref 38–126)
Anion gap: 3 — ABNORMAL LOW (ref 5–15)
BUN: 12 mg/dL (ref 6–20)
CO2: 28 mmol/L (ref 22–32)
Calcium: 9.2 mg/dL (ref 8.9–10.3)
Chloride: 106 mmol/L (ref 98–111)
Creatinine: 0.98 mg/dL (ref 0.44–1.00)
GFR, Estimated: >60 mL/min (ref >60)
Glucose, Bld: 82 mg/dL (ref 70–99)
Potassium: 3.8 mmol/L (ref 3.5–5.1)
Sodium: 137 mmol/L (ref 135–145)
Total Bilirubin: 0.4 mg/dL (ref 0.3–1.2)
Total Protein: 7.1 g/dL (ref 6.5–8.1)

## 2021-09-23 LAB — C-REACTIVE PROTEIN: CRP: 1.3 mg/dL — ABNORMAL HIGH (ref <1.0)

## 2021-09-23 LAB — CBC WITH DIFFERENTIAL (CANCER CENTER ONLY)
Abs Immature Granulocytes: 0.05 10*3/uL (ref 0.00–0.07)
Basophils Absolute: 0 10*3/uL (ref 0.0–0.1)
Basophils Relative: 0 %
Eosinophils Absolute: 0.1 10*3/uL (ref 0.0–0.5)
Eosinophils Relative: 1 %
HCT: 36.3 % (ref 36.0–46.0)
Hemoglobin: 12 g/dL (ref 12.0–15.0)
Immature Granulocytes: 1 %
Lymphocytes Relative: 39 %
Lymphs Abs: 2.8 10*3/uL (ref 0.7–4.0)
MCH: 29.9 pg (ref 26.0–34.0)
MCHC: 33.1 g/dL (ref 30.0–36.0)
MCV: 90.5 fL (ref 80.0–100.0)
Monocytes Absolute: 0.5 10*3/uL (ref 0.1–1.0)
Monocytes Relative: 7 %
Neutro Abs: 3.7 10*3/uL (ref 1.7–7.7)
Neutrophils Relative %: 52 %
Platelet Count: 257 10*3/uL (ref 150–400)
RBC: 4.01 MIL/uL (ref 3.87–5.11)
RDW: 11.9 % (ref 11.5–15.5)
WBC Count: 7.2 10*3/uL (ref 4.0–10.5)
nRBC: 0 % (ref 0.0–0.2)

## 2021-09-23 LAB — LACTATE DEHYDROGENASE: LDH: 187 U/L (ref 98–192)

## 2021-09-23 LAB — SEDIMENTATION RATE: Sed Rate: 18 mm/hr (ref 0–22)

## 2021-09-23 NOTE — Progress Notes (Signed)
?[facility] ?[phone]   [phone] ? ?INITIAL CONSULT NOTE ? ?Patient Care Team: ?Nche, Bonna Gains, NP as PCP - General (Internal Medicine) ? ?Hematological/Oncological History ?# Inguinal Chain Lymphadenopathy ?09/01/2021: CT abdomen/Pelvis showed mildly prominent inguinal chain lymph nodes ,nonspecific. No bulky or encasing adenopathy ?09/23/2021: establish care with Dr. Leonides Schanz  ? ?CHIEF COMPLAINTS/PURPOSE OF CONSULTATION:  ?"Inguinal Chain Lymphadenopathy " ? ?HISTORY OF PRESENTING ILLNESS:  ?Stacy Mills 26 y.o. female with medical history significant for HSV, IUD, migraine, and pelvic inflammatory disease who presents for evaluation of inguinal chain lymphadenopathy. ? ?On review of the previous records Stacy Mills underwent a CT abdomen pelvis on 09/01/2021 at which time she was found to have mildly prominent inguinal chain lymph nodes.  There were nonbulky and reportedly the largest was 1.1 cm.  Due to concern for this finding the patient was referred to hematology for further evaluation and management. ? ?On exam today Stacy Mills notes that she had a urinary tract infection earlier this month and was placed on antibiotic therapy for this finding.  She notes that she did have a fever and felt generalized unwell.  She reports that she had an STI check at the time with no clear evidence of an STI.  She notes that there was concern that she may have had a nephritis and therefore the CT scan was ordered after she received prescription antibiotic she felt considerably better.  She notes that she is prone to occasional sinus infections allergies to hayfever.  She notes her energy has been so-so and ranks about 4 out of 10 today.  Her appetite is also been fluctuating.  Her weight has increased from 50 pounds up to 261.8 pounds today.  She notes that she is not having any fevers, chills, sweats, nausea, vomiting or diarrhea.  She notes that there are no palpable areas of  lymphadenopathy at this time.  Full 10 point ROS is listed below. ? ?On further discussion she is a family history remarkable for non-Hodgkin's lymphoma in her paternal grandmother who passed away at age 73.  There were no other cancers in the family.  Patient does not smoke cigarettes but she does vape.  She notes that she does drink alcohol on rare occasions.  She is currently a caregiver for the elderly.  She notes that she does have some occasional back pain but otherwise denies any new or concerning symptoms. ? ?MEDICAL HISTORY:  ?Past Medical History:  ?Diagnosis Date  ? Allergy   ? Anxiety   ? Depression   ? HSV (herpes simplex virus) anogenital infection   ? IIH (idiopathic intracranial hypertension)   ? IUD (intrauterine device) in place   ? Paraguard placed 2014, removed 10/2015  ? Migraine   ? Pelvic inflammatory disease 10/2012  ? ? ?SURGICAL HISTORY: ?Past Surgical History:  ?Procedure Laterality Date  ? CESAREAN SECTION N/A 08/19/2018  ? Procedure: CESAREAN SECTION;  Surgeon: Jaymes Graff, MD;  Location: [facility] LD ORS;  Service: Obstetrics;  Laterality: N/A;  ? spinal tap    ? ? ?SOCIAL HISTORY: ?Social History  ? ?Socioeconomic History  ? Marital status: Single  ?  Spouse name: n/a  ? Number of children: 0  ? Years of education: 12+  ? Highest education level: Not on file  ?Occupational History  ? Occupation: Archivist  ?Tobacco Use  ? Smoking status: Former  ? Smokeless tobacco: Never  ?Vaping Use  ? Vaping Use: Never used  ?Substance and Sexual  Activity  ? Alcohol use: Not Currently  ?  Alcohol/week: 2.0 standard drinks  ?  Types: 2 Shots of liquor per week  ?  Comment: social  ? Drug use: Not Currently  ?  Types: Other-see comments, Marijuana  ? Sexual activity: Yes  ?  Partners: Male  ?  Birth control/protection: Condom, Implant  ?  Comment: intercourse age 34, more than 5 sexual partners  ?Other Topics Concern  ? Not on file  ?Social History Narrative  ? Graduated from high school 10/2012,  currently attending college.  ? Right-handed  ? Caffeine: 3x/wk  ? ?Social Determinants of Health  ? ?Financial Resource Strain: Not on file  ?Food Insecurity: Not on file  ?Transportation Needs: Not on file  ?Physical Activity: Not on file  ?Stress: Not on file  ?Social Connections: Not on file  ?Intimate Partner Violence: Not on file  ? ? ?FAMILY HISTORY: ?Family History  ?Problem Relation Age of Onset  ? Emphysema Maternal Grandfather   ? Depression Maternal Grandfather   ? Anxiety disorder Maternal Grandfather   ? Mental illness Maternal Grandfather   ? Diabetes Maternal Grandfather   ? Cancer Paternal Grandmother   ? Non-Hodgkin's lymphoma Paternal Grandmother   ? Sudden death Paternal Grandmother   ? Asthma Mother   ? Hypertension Father   ? Alcohol abuse Maternal Aunt   ? Hyperlipidemia Maternal Aunt   ? ? ?ALLERGIES:  is allergic to ivp dye [iodinated contrast media]. ? ?MEDICATIONS:  ?Current Outpatient Medications  ?Medication Sig Dispense Refill  ? acetaminophen (TYLENOL) 325 MG tablet Take 650 mg by mouth every 6 (six) hours as needed for mild pain or moderate pain.    ? cetirizine (ZYRTEC) 10 MG tablet TAKE 1 TABLET BY MOUTH EVERYDAY AT BEDTIME 30 tablet 0  ? fluticasone (FLONASE) 50 MCG/ACT nasal spray SPRAY 2 SPRAYS INTO EACH NOSTRIL EVERY DAY 16 mL 0  ? hydrOXYzine (ATARAX) 25 MG tablet Take 1 tablet (25 mg total) by mouth every 8 (eight) hours as needed. (Patient not taking: Reported on 09/23/2021) 12 tablet 0  ? levonorgestrel (MIRENA) 20 MCG/24HR IUD 1 each by Intrauterine route once.    ? ?No current facility-administered medications for this visit.  ? ? ?REVIEW OF SYSTEMS:   ?Constitutional: ( - ) fevers, ( - )  chills , ( - ) night sweats ?Eyes: ( - ) blurriness of vision, ( - ) double vision, ( - ) watery eyes ?Ears, nose, mouth, throat, and face: ( - ) mucositis, ( - ) sore throat ?Respiratory: ( - ) cough, ( - ) dyspnea, ( - ) wheezes ?Cardiovascular: ( - ) palpitation, ( - ) chest  discomfort, ( - ) lower extremity swelling ?Gastrointestinal:  ( - ) nausea, ( - ) heartburn, ( - ) change in bowel habits ?Skin: ( - ) abnormal skin rashes ?Lymphatics: ( - ) new lymphadenopathy, ( - ) easy bruising ?Neurological: ( - ) numbness, ( - ) tingling, ( - ) new weaknesses ?Behavioral/Psych: ( - ) mood change, ( - ) new changes  ?All other systems were reviewed with the patient and are negative. ? ?PHYSICAL EXAMINATION: ? ?Vitals:  ? 09/23/21 1344  ?BP: (!) 122/99  ?Pulse: 82  ?Resp: 16  ?Temp: 98.1 ?F (36.7 ?C)  ?SpO2: 100%  ? ?Filed Weights  ? 09/23/21 1344  ?Weight: 261 lb 12.8 oz (118.8 kg)  ? ? ?GENERAL: well appearing young African-American female in NAD  ?SKIN: skin color, texture, turgor  are normal, no rashes or significant lesions ?EYES: conjunctiva are pink and non-injected, sclera clear ?LUNGS: clear to auscultation and percussion with normal breathing effort ?HEART: regular rate & rhythm and no murmurs and no lower extremity edema ?Musculoskeletal: no cyanosis of digits and no clubbing  ?PSYCH: alert & oriented x 3, fluent speech ?NEURO: no focal motor/sensory deficits ? ?LABORATORY DATA:  ?I have reviewed the data as listed ? ?  Latest Ref Rng & Units 09/23/2021  ?  2:40 PM 09/16/2021  ?  8:47 AM 08/31/2021  ? 10:09 PM  ?CBC  ?WBC 4.0 - 10.5 K/uL 7.2   8.1   4.7    ?Hemoglobin 12.0 - 15.0 g/dL 16.112.0   09.612.6   04.513.9    ?Hematocrit 36.0 - 46.0 % 36.3   39.0   42.2    ?Platelets 150 - 400 K/uL 257   327.0   221    ? ? ? ?  Latest Ref Rng & Units 09/23/2021  ?  2:40 PM 08/31/2021  ? 10:09 PM 08/31/2021  ?  9:42 AM  ?CMP  ?Glucose 70 - 99 mg/dL 82   92   94    ?BUN 6 - 20 mg/dL 12   10   9     ?Creatinine 0.44 - 1.00 mg/dL 4.090.98   8.111.19   9.141.32    ?Sodium 135 - 145 mmol/L 137   134   135    ?Potassium 3.5 - 5.1 mmol/L 3.8   3.7   4.7    ?Chloride 98 - 111 mmol/L 106   109   103    ?CO2 22 - 32 mmol/L 28   20   23     ?Calcium 8.9 - 10.3 mg/dL 9.2   8.3   9.1    ?Total Protein 6.5 - 8.1 g/dL 7.1   7.6   7.7     ?Total Bilirubin 0.3 - 1.2 mg/dL 0.4   0.3   0.3    ?Alkaline Phos 38 - 126 U/L 53   51   53    ?AST 15 - 41 U/L 15   36   27    ?ALT 0 - 44 U/L 12   27   19     ? ? ? ?ASSESSMENT & PLAN ?Stacy Mills 26 y.o. female with med

## 2021-10-26 ENCOUNTER — Ambulatory Visit: Payer: Medicaid Other | Admitting: Nurse Practitioner

## 2021-12-26 ENCOUNTER — Telehealth: Payer: Medicaid Other | Admitting: Family

## 2021-12-26 DIAGNOSIS — B3731 Acute candidiasis of vulva and vagina: Secondary | ICD-10-CM

## 2021-12-26 MED ORDER — FLUCONAZOLE 150 MG PO TABS: 150.0000 mg | ORAL_TABLET | ORAL | 0 refills | Status: DC | PRN

## 2021-12-26 NOTE — Progress Notes (Signed)
Virtual Visit Consent   Stacy Mills, you are scheduled for a virtual visit with a [facility] provider today. Just as with appointments in the office, your consent must be obtained to participate. Your consent will be active for this visit and any virtual visit you may have with one of our providers in the next 365 days. If you have a MyChart account, a copy of this consent can be sent to you electronically.  As this is a virtual visit, video technology does not allow for your provider to perform a traditional examination. This may limit your provider's ability to fully assess your condition. If your provider identifies any concerns that need to be evaluated in person or the need to arrange testing (such as labs, EKG, etc.), we will make arrangements to do so. Although advances in technology are sophisticated, we cannot ensure that it will always work on either your end or our end. If the connection with a video visit is poor, the visit may have to be switched to a telephone visit. With either a video or telephone visit, we are not always able to ensure that we have a secure connection.  By engaging in this virtual visit, you consent to the provision of healthcare and authorize for your insurance to be billed (if applicable) for the services provided during this visit. Depending on your insurance coverage, you may receive a charge related to this service.  I need to obtain your verbal consent now. Are you willing to proceed with your visit today? Stacy Mills has provided verbal consent on 12/26/2021 for a virtual visit (video or telephone). Stacy Rodney, FNP  Date: 12/26/2021 5:07 PM  Virtual Visit via Video Note   I, Stacy Mills, connected with  Stacy Mills  ([id number], 10-Aug-1995) on 12/26/21 at  5:00 PM EDT by a video-enabled telemedicine application and verified that I am speaking with the correct person using two identifiers.  Location: Patient: Virtual Visit Location Patient:  Home Provider: Virtual Visit Location Provider: Home Office   I discussed the limitations of evaluation and management by telemedicine and the availability of in person appointments. The patient expressed understanding and agreed to proceed.    History of Present Illness: Stacy Mills is a 26 y.o. who identifies as a female who was assigned female at birth, and is being seen today for vaginal discharge.  HPI: Vaginal Discharge The patient's primary symptoms include genital itching and vaginal discharge. The patient's pertinent negatives include no genital odor. This is a new problem. The current episode started yesterday. The problem has been gradually worsening. The pain is mild. Associated symptoms include dysuria. Pertinent negatives include no frequency, headaches, hematuria, nausea or urgency. The vaginal discharge was white.    Problems:  Patient Active Problem List   Diagnosis Date Noted   Family history of non-Hodgkin's lymphoma 09/09/2021   Lymphadenopathy 09/09/2021   Positive RPR test 09/09/2021   Lower abdominal pain 08/31/2021   Acanthosis nigricans 01/26/2021   Liver abscess 05/30/2019   Obesity complicating pregnancy in third trimester 08/19/2018   Idiopathic intracranial hypertension    Migraines 12/22/2014   Hemorrhagic cyst of ovary 11/07/2012   Morbid obesity (HCC) 08/26/2012    Allergies:  Allergies  Allergen Reactions   Ivp Dye [Iodinated Contrast Media] Itching and Nausea Only    Contrast for MRI   Medications:  Current Outpatient Medications:    fluconazole (DIFLUCAN) 150 MG tablet, Take 1 tablet (150 mg total) by mouth every three (3) days as needed.,  Disp: 3 tablet, Rfl: 0   acetaminophen (TYLENOL) 325 MG tablet, Take 650 mg by mouth every 6 (six) hours as needed for mild pain or moderate pain., Disp: , Rfl:    cetirizine (ZYRTEC) 10 MG tablet, TAKE 1 TABLET BY MOUTH EVERYDAY AT BEDTIME, Disp: 30 tablet, Rfl: 0   fluticasone (FLONASE) 50 MCG/ACT nasal  spray, SPRAY 2 SPRAYS INTO EACH NOSTRIL EVERY DAY, Disp: 16 mL, Rfl: 0   levonorgestrel (MIRENA) 20 MCG/24HR IUD, 1 each by Intrauterine route once., Disp: , Rfl:   Observations/Objective: Patient is well-developed, well-nourished in no acute distress.  Resting comfortably  at home.  Head is normocephalic, atraumatic.  No labored breathing. Speech is clear and coherent with logical content.  Patient is alert and oriented at baseline.    Assessment and Plan: 1. Vagina, candidiasis - fluconazole (DIFLUCAN) 150 MG tablet; Take 1 tablet (150 mg total) by mouth every three (3) days as needed.  Dispense: 3 tablet; Refill: 0  Keep clean and dry Avoid harsh chemicals Avid scratching  Follow up if symptoms worsen or do not improve   Follow Up Instructions: I discussed the assessment and treatment plan with the patient. The patient was provided an opportunity to ask questions and all were answered. The patient agreed with the plan and demonstrated an understanding of the instructions.  A copy of instructions were sent to the patient via MyChart unless otherwise noted below.     The patient was advised to call back or seek an in-person evaluation if the symptoms worsen or if the condition fails to improve as anticipated.  Time:  I spent 5 minutes with the patient via telehealth technology discussing the above problems/concerns.    Stacy Rodney, FNP

## 2021-12-26 NOTE — Progress Notes (Signed)
Did video visit. Will close out visit.   Jannifer Rodney, FNP

## 2021-12-28 ENCOUNTER — Encounter: Payer: Self-pay | Admitting: Nurse Practitioner

## 2021-12-30 ENCOUNTER — Encounter: Payer: Self-pay | Admitting: Nurse Practitioner

## 2021-12-30 ENCOUNTER — Ambulatory Visit: Payer: Medicaid Other | Admitting: Nurse Practitioner

## 2021-12-30 ENCOUNTER — Other Ambulatory Visit (HOSPITAL_COMMUNITY)
Admission: RE | Admit: 2021-12-30 | Discharge: 2021-12-30 | Disposition: A | Discharge status: Home / Self Care | Payer: Medicaid Other | Source: Ambulatory Visit | Attending: Nurse Practitioner | Admitting: Nurse Practitioner

## 2021-12-30 VITALS — BP 118/88 | HR 64 | Temp 98.1°F | Ht 64.0 in | Wt 263.6 lb

## 2021-12-30 DIAGNOSIS — Z23 Encounter for immunization: Secondary | ICD-10-CM | POA: Diagnosis not present

## 2021-12-30 DIAGNOSIS — B9689 Other specified bacterial agents as the cause of diseases classified elsewhere: Secondary | ICD-10-CM | POA: Diagnosis not present

## 2021-12-30 DIAGNOSIS — N760 Acute vaginitis: Secondary | ICD-10-CM

## 2021-12-30 NOTE — Addendum Note (Signed)
Addended by: Burnell Blanks on: 12/30/2021 08:52 AM   Modules accepted: Orders

## 2021-12-30 NOTE — Progress Notes (Signed)
Established Patient Visit  Patient: Stacy Mills   DOB: 1995/12/14   26 y.o. Female  MRN: [id number] Visit Date: 12/30/2021  Subjective:    Chief Complaint  Patient presents with   Acute Visit    STD Testing  BV symptoms x 4 days  HPV given today    Vaginal Discharge The patient's primary symptoms include genital itching, a genital odor, vaginal bleeding and vaginal discharge. The patient's pertinent negatives include no genital lesions, genital rash, missed menses or pelvic pain. This is a new problem. The current episode started in the past 7 days. The problem occurs constantly. The problem has been unchanged. The patient is experiencing no pain. The problem affects both sides. She is not pregnant. Pertinent negatives include no abdominal pain, anorexia, back pain, chills, dysuria, fever, flank pain, frequency, hematuria, painful intercourse, rash or urgency. The vaginal discharge was thin and bloody. The vaginal bleeding is typical of menses. She has not been passing clots. She has not been passing tissue. Nothing aggravates the symptoms. She has tried antifungals for the symptoms. The treatment provided no relief. She is sexually active. It is unknown whether or not her partner has an STD. She uses an IUD for contraception. Her menstrual history has been regular. Her past medical history is significant for an STD and vaginosis. There is no history of PID.   Reviewed medical, surgical, and social history today  Medications: Outpatient Medications Prior to Visit  Medication Sig   acetaminophen (TYLENOL) 325 MG tablet Take 650 mg by mouth every 6 (six) hours as needed for mild pain or moderate pain.   cetirizine (ZYRTEC) 10 MG tablet TAKE 1 TABLET BY MOUTH EVERYDAY AT BEDTIME   fluticasone (FLONASE) 50 MCG/ACT nasal spray SPRAY 2 SPRAYS INTO EACH NOSTRIL EVERY DAY   levonorgestrel (MIRENA) 20 MCG/24HR IUD 1 each by Intrauterine route once.   [DISCONTINUED] fluconazole  (DIFLUCAN) 150 MG tablet Take 1 tablet (150 mg total) by mouth every three (3) days as needed. (Patient not taking: Reported on 12/30/2021)   No facility-administered medications prior to visit.   Reviewed past medical and social history.   ROS per HPI above      Objective:  BP 118/88 (BP Location: Right Arm, Patient Position: Sitting, Cuff Size: Normal)   Pulse 64   Temp 98.1 F (36.7 C) (Temporal)   Ht 5\' 4"  (1.626 m)   Wt 263 lb 9.6 oz (119.6 kg)   SpO2 98%   BMI 45.25 kg/m      Physical Exam Vitals and nursing note reviewed. Exam conducted with a chaperone present.  Genitourinary:    Labia:        Right: No rash, tenderness or lesion.        Left: No rash, tenderness or lesion.      Vagina: Vaginal discharge and bleeding present.     Cervix: Discharge present. No cervical motion tenderness, friability, lesion or erythema.     Comments: Inflamed vulva Lymphadenopathy:     Lower Body: No right inguinal adenopathy. No left inguinal adenopathy.  Neurological:     Mental Status: She is alert and oriented to person, place, and time.     No results found for any visits on 12/30/21.    Assessment & Plan:    Problem List Items Addressed This Visit   None Visit Diagnoses     Acute vaginitis    -  Primary  Relevant Orders   Cervicovaginal ancillary only( [facility])      Advised to Start monisat vaginal cream while waiting for lab results Ok to use boric acid OTC for vaginal odor and discharge if needed.  Return if symptoms worsen or fail to improve.     Alysia Penna, NP

## 2021-12-30 NOTE — Patient Instructions (Signed)
Start monisat vaginal cream while waiting for lab results Ok to use boric acid OTC for vaginal odor and discharge if needed  Vaginitis  Vaginitis is irritation and swelling of the vagina. Treatment will depend on the cause. What are the causes? It can be caused by: Bacteria. Yeast. A parasite. A virus. Low hormone levels. Bubble baths, scented tampons, and feminine sprays. Other things can change the balance of the yeast and bacteria that live in the vagina. These include: Antibiotic medicines. Not being clean enough. Some birth control methods. Sex. Infection. Diabetes. A weakened body defense system (immune system). What increases the risk? Smoking or being around someone who smokes. Using washes (douches), scented tampons, or scented pads. Wearing tight pants or thong underwear. Using birth control pills or an IUD. Having sex without a condom or having a lot of partners. Having an STI. Using a certain product to kill sperm (nonoxynol-9). Eating foods that are high in sugar. Having diabetes. Having low levels of a female hormone. Having a weakened body defense system. Being pregnant or breastfeeding. What are the signs or symptoms? Fluid coming from the vagina that is not normal. A bad smell. Itching, pain, or swelling. Pain with sex. Pain or burning when you pee (urinate). Sometimes there are no symptoms. How is this treated? Treatment may include: Antibiotic creams or pills. Antifungal medicines. Medicines to ease symptoms if you have a virus. Your sex partner should also be treated. Estrogen medicines. Avoiding scented soaps, sprays, or douches. Stopping use of products that caused irritation and then using a cream to treat symptoms. Follow these instructions at home: Lifestyle Keep the area around your vagina clean and dry. Avoid using soap. Rinse the area with water. Until your doctor says it is okay: Do not use washes for the vagina. Do not use  tampons. Do not have sex. Wipe from front to back after going to the bathroom. When your doctor says it is okay, practice safe sex and use condoms. General instructions Take over-the-counter and prescription medicines only as told by your doctor. If you were prescribed an antibiotic medicine, take or use it as told by your doctor. Do not stop taking or using it even if you start to feel better. Keep all follow-up visits. How is this prevented? Do not use things that can irritate the vagina, such as fabric softeners. Avoid these products if they are scented: Sprays. Detergents. Tampons. Products for cleaning the vagina. Soaps or bubble baths. Let air reach your vagina. To do this: Wear cotton underwear. Do not wear: Underwear while you sleep. Tight pants. Thong underwear. Underwear or nylons without a cotton panel. Take off any wet clothing, such as bathing suits, as soon as you can. Practice safe sex and use condoms. Contact a doctor if: You have pain in your belly or in the area between your hips. You have a fever or chills. Your symptoms last for more than 2-3 days. Get help right away if: You have a fever and your symptoms get worse all of a sudden. Summary Vaginitis is irritation and swelling of the vagina. Treatment will depend on the cause of the condition. Do not use washes or tampons or have sex until your doctor says it is okay. This information is not intended to replace advice given to you by your health care provider. Make sure you discuss any questions you have with your health care provider. Document Revised: 11/14/2019 Document Reviewed: 11/14/2019 Elsevier Patient Education  2023 [employer].

## 2021-12-31 ENCOUNTER — Telehealth: Payer: Self-pay | Admitting: Nurse Practitioner

## 2021-12-31 LAB — CERVICOVAGINAL ANCILLARY ONLY
Bacterial Vaginitis (gardnerella): POSITIVE — AB
Candida Glabrata: NEGATIVE
Candida Vaginitis: NEGATIVE
Chlamydia: NEGATIVE
Comment: NEGATIVE
Comment: NEGATIVE
Comment: NEGATIVE
Comment: NEGATIVE
Comment: NEGATIVE
Comment: NORMAL
Neisseria Gonorrhea: NEGATIVE
Trichomonas: NEGATIVE

## 2021-12-31 MED ORDER — METRONIDAZOLE 0.75 % VA GEL: 1.0000 | Freq: Every day | VAGINAL | 0 refills | Status: DC

## 2021-12-31 NOTE — Telephone Encounter (Signed)
Noted  

## 2021-12-31 NOTE — Telephone Encounter (Signed)
Pt received MyChart results and is asking for meds to be sent in based on results. Advised her they have not been reviewed by provider yet.  Call back # [phone]

## 2021-12-31 NOTE — Addendum Note (Signed)
Addended by: Alysia Penna L on: 12/31/2021 03:54 PM   Modules accepted: Orders

## 2022-01-25 ENCOUNTER — Ambulatory Visit: Payer: Medicaid Other

## 2022-02-01 ENCOUNTER — Ambulatory Visit: Payer: Medicaid Other

## 2022-02-24 ENCOUNTER — Encounter (INDEPENDENT_AMBULATORY_CARE_PROVIDER_SITE_OTHER): Payer: Self-pay | Admitting: Internal Medicine

## 2022-02-24 ENCOUNTER — Ambulatory Visit (INDEPENDENT_AMBULATORY_CARE_PROVIDER_SITE_OTHER): Payer: Medicaid Other | Admitting: Internal Medicine

## 2022-02-24 VITALS — BP 119/76 | HR 75 | Temp 98.6°F | Ht 65.0 in | Wt 269.0 lb

## 2022-02-24 DIAGNOSIS — Z6841 Body Mass Index (BMI) 40.0 and over, adult: Secondary | ICD-10-CM

## 2022-02-24 DIAGNOSIS — Z0289 Encounter for other administrative examinations: Secondary | ICD-10-CM

## 2022-02-24 DIAGNOSIS — G932 Benign intracranial hypertension: Secondary | ICD-10-CM | POA: Diagnosis not present

## 2022-02-24 DIAGNOSIS — R5383 Other fatigue: Secondary | ICD-10-CM | POA: Diagnosis not present

## 2022-02-24 NOTE — Progress Notes (Signed)
Office: [phone]  /  Fax: [phone]  Initial Visit  Stacy Mills was seen in clinic today to evaluate for obesity. She is interested in losing weight to improve overall health and reduce the risk of weight related complications. She was referred by PCP. She works as a Building control surveyor for the elderly. Has a son. Has a history of idiopathic intracranial HTN. Was treated with diamox in past. No treatment at present time. She has family history of obesity and complications. She reports having fatigue, low energy and does not feel good about herself. She started to gain weight in HS. Her highest weight is current weight. Is not following a nutritional plan or physical activity at present time.   She presents today to review program treatment options, initial physical assessment, and evaluation.      Past medical history includes:   Past Medical History:  Diagnosis Date   Allergy    Anxiety    Depression    HSV (herpes simplex virus) anogenital infection    IIH (idiopathic intracranial hypertension)    IUD (intrauterine device) in place    Paraguard placed 2014, removed 10/2015   Migraine    Pelvic inflammatory disease 10/2012     Objective:   BP 119/76   Pulse 75   Temp 98.6 F (37 C)   Ht 5\' 5"  (1.651 m)   Wt 269 lb (122 kg)   SpO2 98%   BMI 44.76 kg/m  She was weighed on the bioimpedance scale:  Body mass index is 44.76 kg/m.  General:  Alert, oriented and cooperative. Patient is in no acute distress.  Respiratory: Normal respiratory effort, no problems with respiration noted  Extremities: Normal range of motion.    Mental Status: Normal mood and affect. Normal behavior. Normal judgment and thought content.   Assessment and Plan:  1. Idiopathic intracranial hypertension Stable. No longer on diamox. Was seen by neurologist in past. Reviewed disease association and benefits of weight loss therapy.   2. Class 3 severe obesity with serious comorbidity and body mass index  (BMI) of 40.0 to 44.9 in adult, unspecified obesity type ([place])  We discussed BMI, associated conditions and contributing factors.  We will need to determine her basal metabolic rate, total energy expenditure and create a reduced calorie nutritional plan.  She also needs to be assessed for cardiometabolic complications through blood test.  She was also counseled on increasing physical activity more details at future office visits.   3. Other fatigue      Obesity Treatment Plan:  She will work on garnering support from family and friends to begin weight loss journey. Work on eliminating or reducing the presence of highly processed, calorie dense foods in the home. Complete provided nutritional and psychosocial assessment questionnaire.    Aprille Sawhney will follow up in the next 1-2 weeks to review the above steps and continue evaluation and treatment.  Obesity Education Performed Today:  She was weighed on the bioimpedance scale and results were discussed and documented in the synopsis.  We discussed obesity as a disease and the importance of a more detailed evaluation of all the factors contributing to the disease.  We discussed the importance of long term lifestyle changes which include nutrition, exercise and behavioral modifications as well as the importance of customizing this to her specific health and social needs.  We discussed the benefits of reaching a healthier weight to alleviate the symptoms of existing conditions and reduce the risks of the biomechanical, metabolic and psychological  effects of obesity.  We discussed the goals of this program is to improve her overall health and not simply achieve a specific BMI.  Frequent visits are very important to patient success. I plan to see her every 2 weeks for the first 3 months and then evaluate the visit frequency after that time. I explained obesity is a life-long chronic disease and long term treatments would be required.  Medications to help her follow his eating plan may be offered as appropriate but are not required. All medication decisions will be made together after the initial workup is done and benefits and side effects are discussed in depth.  The clinic rules were reviewed including the late policy, cancellation policy, no show and program fees.  Rayne Nogales appears to be in the action stage of change and states they are ready to start intensive lifestyle modifications and behavioral modifications.  30 minutes was spent today on this visit including the above counseling, pre-visit chart review, and post-visit documentation.  Thomes Dinning, MD

## 2022-03-01 DIAGNOSIS — Z0289 Encounter for other administrative examinations: Secondary | ICD-10-CM

## 2022-03-17 ENCOUNTER — Ambulatory Visit (INDEPENDENT_AMBULATORY_CARE_PROVIDER_SITE_OTHER): Payer: Medicaid Other | Admitting: Internal Medicine

## 2022-03-31 ENCOUNTER — Ambulatory Visit (INDEPENDENT_AMBULATORY_CARE_PROVIDER_SITE_OTHER): Payer: Medicaid Other | Admitting: Internal Medicine

## 2022-04-04 ENCOUNTER — Ambulatory Visit: Payer: Medicaid Other | Admitting: Nurse Practitioner

## 2022-04-04 ENCOUNTER — Encounter: Payer: Self-pay | Admitting: Nurse Practitioner

## 2022-04-04 ENCOUNTER — Other Ambulatory Visit (HOSPITAL_COMMUNITY)
Admission: RE | Admit: 2022-04-04 | Discharge: 2022-04-04 | Disposition: A | Discharge status: Home / Self Care | Payer: Medicaid Other | Source: Ambulatory Visit | Attending: Nurse Practitioner | Admitting: Nurse Practitioner

## 2022-04-04 VITALS — BP 126/82 | HR 71 | Temp 98.0°F | Ht 65.0 in | Wt 271.0 lb

## 2022-04-04 DIAGNOSIS — N898 Other specified noninflammatory disorders of vagina: Secondary | ICD-10-CM

## 2022-04-04 DIAGNOSIS — Z113 Encounter for screening for infections with a predominantly sexual mode of transmission: Secondary | ICD-10-CM | POA: Insufficient documentation

## 2022-04-04 NOTE — Progress Notes (Signed)
Established Patient Visit  Patient: Stacy Mills   DOB: 05/24/1996   26 y.o. Female  MRN: [id number] Visit Date: 04/04/2022  Subjective:    Chief Complaint  Patient presents with   Acute Visit    C/o clear vaginal discharge w/o a smell x a week  cyst in back on head  HPV & flu given today    Vaginal Discharge The patient's primary symptoms include vaginal discharge. The patient's pertinent negatives include no genital itching, genital lesions, genital odor, genital rash, missed menses, pelvic pain or vaginal bleeding. This is a recurrent problem. The current episode started in the past 7 days. The problem occurs constantly. The problem has been unchanged. The patient is experiencing no pain. She is not pregnant. Associated symptoms include rash. Pertinent negatives include no abdominal pain, anorexia, back pain, chills, constipation, diarrhea, discolored urine, dysuria, fever, flank pain, frequency, headaches, hematuria, joint pain, nausea, painful intercourse, sore throat, urgency or vomiting. The vaginal discharge was white and thin. The vaginal bleeding is typical of menses. She has not been passing clots. She has not been passing tissue. Nothing aggravates the symptoms. She has tried nothing for the symptoms. She is sexually active. It is unknown whether or not her partner has an STD. She uses an IUD for contraception. Her menstrual history has been regular. Her past medical history is significant for vaginosis. There is no history of endometriosis, herpes simplex, PID or an STD.  Rash This is a new problem. The current episode started in the past 7 days. The problem has been waxing and waning since onset. The affected locations include the scalp. The rash is characterized by pain and scaling. She was exposed to nothing. Pertinent negatives include no anorexia, diarrhea, fever, joint pain, sore throat or vomiting. Past treatments include nothing.   Reviewed medical, surgical,  and social history today  Medications: Outpatient Medications Prior to Visit  Medication Sig   acetaminophen (TYLENOL) 325 MG tablet Take 650 mg by mouth every 6 (six) hours as needed for mild pain or moderate pain.   cetirizine (ZYRTEC) 10 MG tablet TAKE 1 TABLET BY MOUTH EVERYDAY AT BEDTIME   fluticasone (FLONASE) 50 MCG/ACT nasal spray SPRAY 2 SPRAYS INTO EACH NOSTRIL EVERY DAY   levonorgestrel (MIRENA) 20 MCG/24HR IUD 1 each by Intrauterine route once.   [DISCONTINUED] metroNIDAZOLE (METROGEL VAGINAL) 0.75 % vaginal gel Place 1 Applicatorful vaginally at bedtime.   No facility-administered medications prior to visit.   Reviewed past medical and social history.   ROS per HPI above      Objective:  BP 126/82 (BP Location: Right Arm, Patient Position: Sitting, Cuff Size: Normal)   Pulse 71   Temp 98 F (36.7 C) (Temporal)   Ht 5\' 5"  (1.651 m)   Wt 271 lb (122.9 kg)   SpO2 98%   BMI 45.10 kg/m      Physical Exam HENT:     Head:   Cardiovascular:     Pulses: Normal pulses.  Genitourinary:    Comments: Opted to self swab Skin:    Findings: Rash present. No lesion. Rash is nodular.     Comments: Mild erythema of scalp lesion, no drainage, no fluctuance  Neurological:     Mental Status: She is alert.     No results found for any visits on 04/04/22.    Assessment & Plan:    Problem List Items Addressed This Visit  None Visit Diagnoses     Vaginal discharge    -  Primary   Relevant Orders   Cervicovaginal ancillary only( [place])   Screen for STD (sexually transmitted disease)       Relevant Orders   Cervicovaginal ancillary only( [place])   HIV Antibody (routine testing w rflx)   RPR     Scalp lesion: possibly infected hair follicle. Advised to use topical antibiotic ointment BID x 1week.  Return if symptoms worsen or fail to improve.     Alysia Penna, NP

## 2022-04-04 NOTE — Patient Instructions (Signed)
Use neosporin on scalp lesion.

## 2022-04-05 ENCOUNTER — Ambulatory Visit (INDEPENDENT_AMBULATORY_CARE_PROVIDER_SITE_OTHER): Payer: Medicaid Other | Admitting: Internal Medicine

## 2022-04-05 ENCOUNTER — Encounter (INDEPENDENT_AMBULATORY_CARE_PROVIDER_SITE_OTHER): Payer: Self-pay | Admitting: Internal Medicine

## 2022-04-05 ENCOUNTER — Other Ambulatory Visit: Payer: Self-pay | Admitting: Nurse Practitioner

## 2022-04-05 VITALS — BP 125/84 | HR 66 | Temp 98.3°F | Ht 65.0 in | Wt 267.0 lb

## 2022-04-05 DIAGNOSIS — G932 Benign intracranial hypertension: Secondary | ICD-10-CM

## 2022-04-05 DIAGNOSIS — R944 Abnormal results of kidney function studies: Secondary | ICD-10-CM

## 2022-04-05 DIAGNOSIS — Z6841 Body Mass Index (BMI) 40.0 and over, adult: Secondary | ICD-10-CM | POA: Diagnosis not present

## 2022-04-05 DIAGNOSIS — R0602 Shortness of breath: Secondary | ICD-10-CM

## 2022-04-05 DIAGNOSIS — R5383 Other fatigue: Secondary | ICD-10-CM | POA: Diagnosis not present

## 2022-04-05 DIAGNOSIS — F3289 Other specified depressive episodes: Secondary | ICD-10-CM

## 2022-04-05 DIAGNOSIS — Z1331 Encounter for screening for depression: Secondary | ICD-10-CM

## 2022-04-05 LAB — CERVICOVAGINAL ANCILLARY ONLY
Bacterial Vaginitis (gardnerella): NEGATIVE
Candida Glabrata: NEGATIVE
Candida Vaginitis: NEGATIVE
Chlamydia: NEGATIVE
Comment: NEGATIVE
Comment: NEGATIVE
Comment: NEGATIVE
Comment: NEGATIVE
Comment: NEGATIVE
Comment: NORMAL
Neisseria Gonorrhea: NEGATIVE
Trichomonas: NEGATIVE

## 2022-04-05 LAB — RPR: RPR Ser Ql: NONREACTIVE

## 2022-04-05 LAB — HIV ANTIBODY (ROUTINE TESTING W REFLEX): HIV 1&2 Ab, 4th Generation: NONREACTIVE

## 2022-04-06 LAB — LIPID PANEL WITH LDL/HDL RATIO
Cholesterol, Total: 164 mg/dL (ref 100–199)
HDL: 61 mg/dL (ref >39)
LDL Chol Calc (NIH): 92 mg/dL (ref 0–99)
LDL/HDL Ratio: 1.5 ratio (ref 0.0–3.2)
Triglycerides: 57 mg/dL (ref 0–149)
VLDL Cholesterol Cal: 11 mg/dL (ref 5–40)

## 2022-04-06 LAB — COMPREHENSIVE METABOLIC PANEL
ALT: 13 IU/L (ref 0–32)
AST: 18 IU/L (ref 0–40)
Albumin/Globulin Ratio: 1.6 (ref 1.2–2.2)
Albumin: 4.4 g/dL (ref 4.0–5.0)
Alkaline Phosphatase: 66 IU/L (ref 44–121)
BUN/Creatinine Ratio: 12 (ref 9–23)
BUN: 10 mg/dL (ref 6–20)
Bilirubin Total: <0.2 mg/dL (ref 0.0–1.2)
CO2: 21 mmol/L (ref 20–29)
Calcium: 9.3 mg/dL (ref 8.7–10.2)
Chloride: 107 mmol/L — ABNORMAL HIGH (ref 96–106)
Creatinine, Ser: 0.85 mg/dL (ref 0.57–1.00)
Globulin, Total: 2.8 g/dL (ref 1.5–4.5)
Glucose: 79 mg/dL (ref 70–99)
Potassium: 4.7 mmol/L (ref 3.5–5.2)
Sodium: 140 mmol/L (ref 134–144)
Total Protein: 7.2 g/dL (ref 6.0–8.5)
eGFR: 96 mL/min/{1.73_m2} (ref >59)

## 2022-04-06 LAB — HEMOGLOBIN A1C
Est. average glucose Bld gHb Est-mCnc: 103 mg/dL
Hgb A1c MFr Bld: 5.2 % (ref 4.8–5.6)

## 2022-04-06 LAB — CBC WITH DIFFERENTIAL/PLATELET
Basophils Absolute: 0 10*3/uL (ref 0.0–0.2)
Basos: 0 %
EOS (ABSOLUTE): 0.2 10*3/uL (ref 0.0–0.4)
Eos: 2 %
Hematocrit: 42.1 % (ref 34.0–46.6)
Hemoglobin: 13.8 g/dL (ref 11.1–15.9)
Immature Grans (Abs): 0 10*3/uL (ref 0.0–0.1)
Immature Granulocytes: 0 %
Lymphocytes Absolute: 2.8 10*3/uL (ref 0.7–3.1)
Lymphs: 32 %
MCH: 29.7 pg (ref 26.6–33.0)
MCHC: 32.8 g/dL (ref 31.5–35.7)
MCV: 91 fL (ref 79–97)
Monocytes Absolute: 0.6 10*3/uL (ref 0.1–0.9)
Monocytes: 7 %
Neutrophils Absolute: 5.1 10*3/uL (ref 1.4–7.0)
Neutrophils: 59 %
Platelets: 333 10*3/uL (ref 150–450)
RBC: 4.65 x10E6/uL (ref 3.77–5.28)
RDW: 11.6 % — ABNORMAL LOW (ref 11.7–15.4)
WBC: 8.7 10*3/uL (ref 3.4–10.8)

## 2022-04-06 LAB — VITAMIN B12: Vitamin B-12: 439 pg/mL (ref 232–1245)

## 2022-04-06 LAB — VITAMIN D 25 HYDROXY (VIT D DEFICIENCY, FRACTURES): Vit D, 25-Hydroxy: 11.1 ng/mL — ABNORMAL LOW (ref 30.0–100.0)

## 2022-04-06 LAB — TSH: TSH: 1.25 u[IU]/mL (ref 0.450–4.500)

## 2022-04-06 LAB — INSULIN, RANDOM: INSULIN: 15.5 u[IU]/mL (ref 2.6–24.9)

## 2022-04-24 NOTE — Progress Notes (Signed)
Chief Complaint:   OBESITY Stacy Mills (MR# [id number]) is a 26 y.o. female who presents for evaluation and treatment of obesity and related comorbidities. Current BMI is Body mass index is 44.43 kg/m. Stacy Mills has been struggling with her weight for many years and has been unsuccessful in either losing weight, maintaining weight loss, or reaching her healthy weight goal.  Stacy Mills is currently in the action stage of change and ready to dedicate time achieving and maintaining a healthier weight. Stacy Mills is interested in becoming our patient and working on intensive lifestyle modifications including (but not limited to) diet and exercise for weight loss.  Stacy Mills's habits were reviewed today and are as follows: Her family eats meals together, she thinks her family will eat healthier with her, her desired weight loss is 72 lbs, she has been heavy most of her life, she started gaining weight in high school, her heaviest weight ever was 295 pounds, she is a picky eater and doesn't like to eat healthier foods, she has significant food cravings issues, she snacks frequently in the evenings, she skips meals frequently, she is trying to follow a vegetarian diet, she is frequently drinking liquids with calories, she frequently makes poor food choices, she frequently eats larger portions than normal, she has binge eating behaviors, and she struggles with emotional eating.  Depression Screen Stacy Mills's Food and Mood (modified PHQ-9) score was 12.     04/05/2022    7:07 AM  Depression screen PHQ 2/9  Decreased Interest 1  Down, Depressed, Hopeless 2  PHQ - 2 Score 3  Altered sleeping 2  Tired, decreased energy 2  Change in appetite 3  Feeling bad or failure about yourself  0  Trouble concentrating 1  Moving slowly or fidgety/restless 1  Suicidal thoughts 0  PHQ-9 Score 12  Difficult doing work/chores Very difficult   Subjective:   1. Other fatigue Stacy Mills admits to daytime somnolence and  admits to waking up still tired. Patient has a history of symptoms of daytime fatigue and morning fatigue. Stacy Mills generally gets 8 hours of sleep per night, and states that she has poor sleep quality. Snoring is present. Apneic episodes are not present. Epworth Sleepiness Score is 3.   2. SOB (shortness of breath) on exertion Anira notes increasing shortness of breath with exercising and seems to be worsening over time with weight gain. She notes getting out of breath sooner with activity than she used to. This has gotten worse recently. Blaike denies shortness of breath at rest or orthopnea.  3. Decreased GFR Discussed labs with patient today. GFR 55.4 in April 2023. Pt has no history of kidney disease.  4. Idiopathic intracranial hypertension Symptoms improved, and pt is asymptomatic.  5. Other depression, emotional eating Counseling and information provided. Rule out insulin resistance.  Assessment/Plan:   1. Other fatigue Marjoria does feel that her weight is causing her energy to be lower than it should be. Fatigue may be related to obesity, depression or many other causes. Labs will be ordered, and in the meanwhile, [name] will focus on self care including making healthy food choices, increasing physical activity and focusing on stress reduction.  Lab/Orders today or future: - EKG 12-Lead - VITAMIN D 25 Hydroxy (Vit-D Deficiency, Fractures) - TSH - Comprehensive metabolic panel - CBC with Differential/Platelet - Vitamin B12  2. SOB (shortness of breath) on exertion Stacy Mills does feel that she gets out of breath more easily that she used to when she exercises.  Stacy Mills's shortness of breath appears to be obesity related and exercise induced. She has agreed to work on weight loss and gradually increase exercise to treat her exercise induced shortness of breath. Will continue to monitor closely.  3. Decreased GFR Avoid nephrotoxins and increase water intake.  4. Idiopathic  intracranial hypertension Continue weight loss therapy.  Monitor for worsening symptoms  5. Other depression, emotional eating Check insulin and A1c. Consider behavioral health referral if no improvement with nutritional therapy.  6. Depression screening Stacy Mills had a positive depression screening. Depression is commonly associated with obesity and often results in emotional eating behaviors. We will monitor this closely and work on CBT to help improve the non-hunger eating patterns. Referral to Psychology may be required if no improvement is seen as she continues in our clinic.  7. Class 3 severe obesity without serious comorbidity with body mass index (BMI) of 40.0 to 44.9 in adult, unspecified obesity type ([facility]) - Lipid Panel With LDL/HDL Ratio - Insulin, random - Hemoglobin A1c  Stacy Mills is currently in the action stage of change and her goal is to continue with weight loss efforts. I recommend Stacy Mills begin the structured treatment plan as follows:  She has agreed to following a lower carbohydrate, vegetable and lean protein rich diet plan.  Exercise goals:  Increase walking to 3-4 days a week.    Behavioral modification strategies: increasing lean protein intake, decreasing simple carbohydrates, increasing water intake, decreasing liquid calories, increasing high fiber foods, decreasing eating out, no skipping meals, meal planning and cooking strategies, better snacking choices, avoiding temptations, and planning for success.  She was informed of the importance of frequent follow-up visits to maximize her success with intensive lifestyle modifications for her multiple health conditions. She was informed we would discuss her lab results at her next visit unless there is a critical issue that needs to be addressed sooner. Stacy Mills agreed to keep her next visit at the agreed upon time to discuss these results.  Objective:   Blood pressure 125/84, pulse 66, temperature 98.3 F (36.8 C), height  5\' 5"  (1.651 m), weight 267 lb (121.1 kg), SpO2 99 %, unknown if currently breastfeeding. Body mass index is 44.43 kg/m.  EKG: Normal sinus rhythm, rate 70.  Indirect Calorimeter completed today shows a VO2 of 285 and a REE of 1973.  Her calculated basal metabolic rate is thus her basal metabolic rate is worse than expected.  General: Cooperative, alert, well developed, in no acute distress. HEENT: Conjunctivae and lids unremarkable. Cardiovascular: Regular rhythm.  Lungs: Normal work of breathing. Neurologic: No focal deficits.   Lab Results  Component Value Date   CREATININE 0.85 04/05/2022   BUN 10 04/05/2022   NA 140 04/05/2022   K 4.7 04/05/2022   CL 107 (H) 04/05/2022   CO2 21 04/05/2022   Lab Results  Component Value Date   ALT 13 04/05/2022   AST 18 04/05/2022   ALKPHOS 66 04/05/2022   BILITOT <0.2 04/05/2022   Lab Results  Component Value Date   HGBA1C 5.2 04/05/2022   HGBA1C 5.4 07/27/2021   HGBA1C 5.2 01/26/2021   HGBA1C 5.4 05/09/2016   Lab Results  Component Value Date   INSULIN 15.5 04/05/2022   Lab Results  Component Value Date   TSH 1.250 04/05/2022   Lab Results  Component Value Date   CHOL 164 04/05/2022   HDL 61 04/05/2022   LDLCALC 92 04/05/2022   TRIG 57 04/05/2022   CHOLHDL 3 01/26/2021  Lab Results  Component Value Date   WBC 8.7 04/05/2022   HGB 13.8 04/05/2022   HCT 42.1 04/05/2022   MCV 91 04/05/2022   PLT 333 04/05/2022   Attestation Statements:   Reviewed by clinician on day of visit: allergies, medications, problem list, medical history, surgical history, family history, social history, and previous encounter notes.  Time spent on visit including pre-visit chart review and post-visit charting and care was 40 minutes.   I, Kyung Rudd, BS, CMA, am acting as transcriptionist for Worthy Rancher, MD.  I have reviewed the above documentation for accuracy and completeness, and I agree with the above. -Worthy Rancher, MD

## 2022-04-27 ENCOUNTER — Encounter (INDEPENDENT_AMBULATORY_CARE_PROVIDER_SITE_OTHER): Payer: Self-pay

## 2022-04-27 ENCOUNTER — Ambulatory Visit (INDEPENDENT_AMBULATORY_CARE_PROVIDER_SITE_OTHER): Payer: Medicaid Other | Admitting: Internal Medicine

## 2022-05-04 ENCOUNTER — Encounter (INDEPENDENT_AMBULATORY_CARE_PROVIDER_SITE_OTHER): Payer: Self-pay | Admitting: Internal Medicine

## 2022-05-04 ENCOUNTER — Ambulatory Visit (INDEPENDENT_AMBULATORY_CARE_PROVIDER_SITE_OTHER): Payer: Medicaid Other | Admitting: Internal Medicine

## 2022-05-04 VITALS — BP 124/86 | HR 76 | Temp 98.3°F | Ht 65.0 in | Wt 265.0 lb

## 2022-05-04 DIAGNOSIS — E88819 Insulin resistance, unspecified: Secondary | ICD-10-CM

## 2022-05-04 DIAGNOSIS — E559 Vitamin D deficiency, unspecified: Secondary | ICD-10-CM | POA: Diagnosis not present

## 2022-05-04 DIAGNOSIS — Z6841 Body Mass Index (BMI) 40.0 and over, adult: Secondary | ICD-10-CM | POA: Diagnosis not present

## 2022-05-04 DIAGNOSIS — R944 Abnormal results of kidney function studies: Secondary | ICD-10-CM | POA: Diagnosis not present

## 2022-05-04 DIAGNOSIS — E669 Obesity, unspecified: Secondary | ICD-10-CM

## 2022-05-04 MED ORDER — ERGOCALCIFEROL 1.25 MG (50000 UT) PO CAPS: 50000.0000 [IU] | ORAL_CAPSULE | ORAL | 0 refills | Status: DC

## 2022-05-18 NOTE — Progress Notes (Signed)
Chief Complaint:   OBESITY Stacy Mills is here to discuss her progress with her obesity treatment plan along with follow-up of her obesity related diagnoses. Stacy Mills is on following a lower carbohydrate, vegetable and lean protein rich diet plan and states she is following her eating plan approximately 30% of the time. Stacy Mills states she is walking 15 minutes 3 times per week.  Today's visit was #: 2 Starting weight: 267 lbs Starting date: 04/05/2022 Today's weight: 264 lbs Today's date: 05/04/2022 Total lbs lost to date: 3 Total lbs lost since last in-office visit: 3  Interim History: Patient displays having some difficulty initiating nutritional plan. She is skipping meals and dislikes some food options in low carb plan. She has reduced sugary drinks and snacking at night. Pt denies binge eating behavior. She feels comfortable reading labels.  Subjective:   1. Vitamin D deficiency Discussed labs with patient today. Vitamin D level 11.1, associated with adiposity, leptin resistance, and weight gain.  2. Decreased GFR Discussed labs with patient today. GFR back to normal.   3. Insulin resistance Discussed labs with patient today. Insulin 19.5, optimal <7. May contribute to cravings.  Assessment/Plan:   1. Vitamin D deficiency Start Vitamin D2 50k once a week for 3 months for a level of 50-60.  Start- ergocalciferol (VITAMIN D2) 1.25 MG (50000 UT) capsule; Take 1 capsule (50,000 Units total) by mouth once a week.  Dispense: 12 capsule; Refill: 0  2. Decreased GFR No further work-up at present time.  Continue to monitor for decline.  Counseled on avoidance of nephrotoxins and maintaining adequate hydration  3. Insulin resistance Weight loss therapy. Increase complex carbs, decrease simple sugars.  4. Obesity, current BMI 43.9 Stacy Mills is currently in the action stage of change. As such, her goal is to continue with weight loss efforts. She has agreed to keeping a food journal  and adhering to recommended goals of 1500 calories and 100 grams protein.   Pt has difficulties following meal plans. We discussed journaling. We installed and demonstrated use of app. We also reviewed labs, no significant metabolic derangement. Insulin above optimal.  Exercise goals:  Thinking of starting at the gym with friends.  Behavioral modification strategies: increasing lean protein intake, decreasing simple carbohydrates, increasing water intake, no skipping meals, meal planning and cooking strategies, keeping healthy foods in the home, planning for success, and keeping a strict food journal.  Stacy Mills has agreed to follow-up with our clinic in 2 weeks. She was informed of the importance of frequent follow-up visits to maximize her success with intensive lifestyle modifications for her multiple health conditions.   Objective:   Blood pressure 124/86, pulse 76, temperature 98.3 F (36.8 C), height 5\' 5"  (1.651 m), weight 265 lb (120.2 kg), SpO2 100 %, unknown if currently breastfeeding. Body mass index is 44.1 kg/m.  General: Cooperative, alert, well developed, in no acute distress. HEENT: Conjunctivae and lids unremarkable. Cardiovascular: Regular rhythm.  Lungs: Normal work of breathing. Neurologic: No focal deficits.   Lab Results  Component Value Date   CREATININE 0.85 04/05/2022   BUN 10 04/05/2022   NA 140 04/05/2022   K 4.7 04/05/2022   CL 107 (H) 04/05/2022   CO2 21 04/05/2022   Lab Results  Component Value Date   ALT 13 04/05/2022   AST 18 04/05/2022   ALKPHOS 66 04/05/2022   BILITOT <0.2 04/05/2022   Lab Results  Component Value Date   HGBA1C 5.2 04/05/2022   HGBA1C 5.4 07/27/2021  HGBA1C 5.2 01/26/2021   HGBA1C 5.4 05/09/2016   Lab Results  Component Value Date   INSULIN 15.5 04/05/2022   Lab Results  Component Value Date   TSH 1.250 04/05/2022   Lab Results  Component Value Date   CHOL 164 04/05/2022   HDL 61 04/05/2022   LDLCALC 92  04/05/2022   TRIG 57 04/05/2022   CHOLHDL 3 01/26/2021   Lab Results  Component Value Date   VD25OH 11.1 (L) 04/05/2022   Lab Results  Component Value Date   WBC 8.7 04/05/2022   HGB 13.8 04/05/2022   HCT 42.1 04/05/2022   MCV 91 04/05/2022   PLT 333 04/05/2022    Attestation Statements:   Reviewed by clinician on day of visit: allergies, medications, problem list, medical history, surgical history, family history, social history, and previous encounter notes.  Time spent on visit including pre-visit chart review and post-visit care and charting was 40 minutes.   I, Kathlene November, BS, CMA, am acting as transcriptionist for Thomes Dinning, MD.  I have reviewed the above documentation for accuracy and completeness, and I agree with the above. -Thomes Dinning, MD

## 2022-05-24 DIAGNOSIS — H5213 Myopia, bilateral: Secondary | ICD-10-CM | POA: Diagnosis not present

## 2022-06-01 ENCOUNTER — Encounter (INDEPENDENT_AMBULATORY_CARE_PROVIDER_SITE_OTHER): Payer: Self-pay | Admitting: Internal Medicine

## 2022-06-01 ENCOUNTER — Ambulatory Visit (INDEPENDENT_AMBULATORY_CARE_PROVIDER_SITE_OTHER): Payer: Medicaid Other | Admitting: Internal Medicine

## 2022-06-01 VITALS — BP 120/86 | HR 81 | Temp 98.1°F | Ht 65.0 in | Wt 268.0 lb

## 2022-06-01 DIAGNOSIS — R638 Other symptoms and signs concerning food and fluid intake: Secondary | ICD-10-CM

## 2022-06-01 DIAGNOSIS — E559 Vitamin D deficiency, unspecified: Secondary | ICD-10-CM

## 2022-06-01 DIAGNOSIS — Z6841 Body Mass Index (BMI) 40.0 and over, adult: Secondary | ICD-10-CM

## 2022-06-01 DIAGNOSIS — E669 Obesity, unspecified: Secondary | ICD-10-CM

## 2022-06-01 DIAGNOSIS — E88819 Insulin resistance, unspecified: Secondary | ICD-10-CM | POA: Diagnosis not present

## 2022-06-01 MED ORDER — METFORMIN HCL ER 500 MG PO TB24: 500.0000 mg | ORAL_TABLET | Freq: Every day | ORAL | 0 refills | Status: DC

## 2022-06-02 NOTE — Progress Notes (Signed)
Chief Complaint:   OBESITY Stacy Mills is here to discuss her progress with her obesity treatment plan along with follow-up of her obesity related diagnoses. Stacy Mills is on keeping a food journal and adhering to recommended goals of 1500 calories and 100 grams of protein and states she is following her eating plan approximately 30% of the time. Stacy Mills states she is doing 0 minutes 0 times per week.  Today's visit was #: 3 Starting weight: 267 lbs Starting date: 04/05/2022 Today's weight: 268 lbs Today's date: 06/01/2022 Total lbs lost to date: 0 Total lbs lost since last in-office visit: 0  Interim History: Stacy Mills presents today for follow-up.  She has not implemented prescribed calorie reduced meal plan.  24-hour recall she skipped breakfast yesterday and had pizza for lunch 2 slices and a large lemonade.  For dinner she had grilled pork chops, homemade collards and pinto beans.  She acknowledges having difficult time with meal prep.  She also has cravings for sweets.  These tempting foods are available and consist mostly of little Debbie cakes and chocolate.  Her physical activity levels are low.  She denies high stress levels, inadequate sleep or emotional eating behavior.  Subjective:   1. Insulin resistance Her insulin levels have been 15.5 optimal is less than 7.  This may contribute to carb cravings.  2. Vitamin D deficiency Her vitamin D levels were 11.1.  This is associated with adiposity, leptin resistance and weight gain.  She is currently on high-dose vitamin D without any side effects.  3. Abnormal craving Multifactorial, insulin resistant a contributor.  Assessment/Plan:   1. Insulin resistance She will work on reducing simple sugars, program implementation and we will start metformin 500 mg daily.  2. Vitamin D deficiency She will continue vitamin D supplementation we will check levels at 3 to 4 months.  3. Abnormal craving Counseled on behavioral modification  decreasing simple sugars and metformin will be started.  4. Obesity, currrent BMI 44.7 After discussion of benefits, common side effects, risks and off label use and shared decision making patient is agreeable to starting metformin 500 mg daily for its incretin effect.  This will also help with carb cravings.  Stacy Mills is currently in the action stage of change. As such, her goal is to continue with weight loss efforts. She has agreed to the Category 4 Plan.   Exercise goals: No exercise has been prescribed at this time.  Behavioral modification strategies: increasing lean protein intake, decreasing simple carbohydrates, increasing water intake, decreasing liquid calories, no skipping meals, better snacking choices, avoiding temptations, and planning for success.  Stacy Mills has agreed to follow-up with our clinic in 3 weeks. She was informed of the importance of frequent follow-up visits to maximize her success with intensive lifestyle modifications for her multiple health conditions.   Objective:   Blood pressure 120/86, pulse 81, temperature 98.1 F (36.7 C), height 5\' 5"  (1.651 m), weight 268 lb (121.6 kg), SpO2 99 %, unknown if currently breastfeeding. Body mass index is 44.6 kg/m.  General: Cooperative, alert, well developed, in no acute distress. HEENT: Conjunctivae and lids unremarkable. Cardiovascular: Regular rhythm.  Lungs: Normal work of breathing. Neurologic: No focal deficits.   Lab Results  Component Value Date   CREATININE 0.85 04/05/2022   BUN 10 04/05/2022   NA 140 04/05/2022   K 4.7 04/05/2022   CL 107 (H) 04/05/2022   CO2 21 04/05/2022   Lab Results  Component Value Date   ALT 13  04/05/2022   AST 18 04/05/2022   ALKPHOS 66 04/05/2022   BILITOT <0.2 04/05/2022   Lab Results  Component Value Date   HGBA1C 5.2 04/05/2022   HGBA1C 5.4 07/27/2021   HGBA1C 5.2 01/26/2021   HGBA1C 5.4 05/09/2016   Lab Results  Component Value Date   INSULIN 15.5 04/05/2022    Lab Results  Component Value Date   TSH 1.250 04/05/2022   Lab Results  Component Value Date   CHOL 164 04/05/2022   HDL 61 04/05/2022   LDLCALC 92 04/05/2022   TRIG 57 04/05/2022   CHOLHDL 3 01/26/2021   Lab Results  Component Value Date   VD25OH 11.1 (L) 04/05/2022   Lab Results  Component Value Date   WBC 8.7 04/05/2022   HGB 13.8 04/05/2022   HCT 42.1 04/05/2022   MCV 91 04/05/2022   PLT 333 04/05/2022   No results found for: "IRON", "TIBC", "FERRITIN"  Attestation Statements:   Reviewed by clinician on day of visit: allergies, medications, problem list, medical history, surgical history, family history, social history, and previous encounter notes.   Wilhemena Durie, am acting as transcriptionist for Thomes Dinning, MD.  I have reviewed the above documentation for accuracy and completeness, and I agree with the above. -Thomes Dinning, MD

## 2022-06-23 ENCOUNTER — Encounter (INDEPENDENT_AMBULATORY_CARE_PROVIDER_SITE_OTHER): Payer: Self-pay | Admitting: Internal Medicine

## 2022-06-23 ENCOUNTER — Ambulatory Visit (INDEPENDENT_AMBULATORY_CARE_PROVIDER_SITE_OTHER): Payer: Medicaid Other | Admitting: Internal Medicine

## 2022-06-23 VITALS — BP 118/84 | HR 81 | Temp 97.9°F | Ht 65.0 in | Wt 269.0 lb

## 2022-06-23 DIAGNOSIS — E88819 Insulin resistance, unspecified: Secondary | ICD-10-CM | POA: Diagnosis not present

## 2022-06-23 DIAGNOSIS — E669 Obesity, unspecified: Secondary | ICD-10-CM

## 2022-06-23 DIAGNOSIS — E559 Vitamin D deficiency, unspecified: Secondary | ICD-10-CM

## 2022-06-23 DIAGNOSIS — Z6841 Body Mass Index (BMI) 40.0 and over, adult: Secondary | ICD-10-CM | POA: Diagnosis not present

## 2022-06-23 MED ORDER — ERGOCALCIFEROL 1.25 MG (50000 UT) PO CAPS: 50000.0000 [IU] | ORAL_CAPSULE | ORAL | 0 refills | Status: DC

## 2022-06-23 MED ORDER — METFORMIN HCL ER 500 MG PO TB24: 500.0000 mg | ORAL_TABLET | Freq: Every day | ORAL | 0 refills | Status: DC

## 2022-06-23 NOTE — Progress Notes (Signed)
Office: [phone]  /  Fax: [phone]  WEIGHT SUMMARY AND BIOMETRICS  Medical Weight Loss Height: 5\' 5"  (1.651 m) Weight: 269 lb (122 kg) Temp: 97.9 F (36.6 C) Pulse Rate: 81 BP: 118/84 SpO2: 98 % Fasting: no Labs: no Today's Visit #: 4 Weight at Last VIsit: 268 lb Weight Lost Since Last Visit: 0 lb  Body Fat %: 48.5 % Fat Mass (lbs): 130.6 lbs Muscle Mass (lbs): 131.6 lbs Total Body Water (lbs): 102 lbs Visceral Fat Rating : 13 Peak Weight: 280 lb Starting Date: 04/05/22 Starting Weight: 267 lb Total Weight Loss (lbs): 0 lb (0 kg)    HPI  Chief Complaint: OBESITY  Stacy Mills is here to discuss her progress with her obesity treatment plan. She is on the keeping a food journal and adhering to recommended goals of 1500 calories and unsure of  protein and states she is following her eating plan approximately 45 % of the time. She states she is walking 15-30 minutes 3 times per week.   Interval History: Since last office she reports low adherence to prescribed reduced calorie plan.  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  She has not been doing meal prep.  For dinner she had chicken with rice and green beans.  She denies consumption of liquid calories she is also notes a reduction in snacking and not eating out as much.  She had downloaded app but did not journal.  She had been prescribed metformin to assist with appetite and cravings for sweets but she never filled the medication.  Pharmacotherapy: Metformin for incretin effect but never filled  PHYSICAL EXAM:  Blood pressure 118/84, pulse 81, temperature 97.9 F (36.6 C), height 5\' 5"  (1.651 m), weight 269 lb (122 kg), SpO2 98 %, unknown if currently breastfeeding. Body mass index is 44.76 kg/m.  General: She is overweight, cooperative, alert, well developed, and in  no acute distress. PSYCH: Has normal mood, affect and thought process.   HEENT: EOMI, sclerae are anicteric. Lungs: Normal breathing effort, no conversational dyspnea. Extremities: No edema.  Neurologic: No gross sensory or motor deficits. No tremors or fasciculations noted.    DIAGNOSTIC DATA REVIEWED:  BMET    Component Value Date/Time   NA 140 04/05/2022 0839   K 4.7 04/05/2022 0839   CL 107 (H) 04/05/2022 0839   CO2 21 04/05/2022 0839   GLUCOSE 79 04/05/2022 0839   GLUCOSE 82 09/23/2021 1440   BUN 10 04/05/2022 0839   CREATININE 0.85 04/05/2022 0839   CREATININE 0.98 09/23/2021 1440   CREATININE 0.93 11/05/2012 1015   CALCIUM 9.3 04/05/2022 0839   GFRNONAA >60 09/23/2021 1440   GFRAA >60 05/31/2019 0417   Lab Results  Component Value Date   HGBA1C 5.2 04/05/2022   HGBA1C 5.4 05/09/2016   Lab Results  Component Value Date   INSULIN 15.5 04/05/2022   CBC    Component Value Date/Time   WBC 8.7 04/05/2022 0839   WBC 7.2 09/23/2021 1440   WBC 8.1 09/16/2021 0847   RBC 4.65 04/05/2022 0839   RBC 4.01 09/23/2021 1440   HGB 13.8 04/05/2022 0839   HCT 42.1 04/05/2022 0839   PLT 333 04/05/2022 0839   MCV 91 04/05/2022 0839   MCH 29.7 04/05/2022 0839   MCH 29.9 09/23/2021 1440   MCHC 32.8 04/05/2022 0839   MCHC  33.1 09/23/2021 1440   RDW 11.6 (L) 04/05/2022 0839   Iron/TIBC/Ferritin/ %Sat No results found for: "IRON", "TIBC", "FERRITIN", "IRONPCTSAT" Lipid Panel     Component Value Date/Time   CHOL 164 04/05/2022 0839   TRIG 57 04/05/2022 0839   HDL 61 04/05/2022 0839   CHOLHDL 3 01/26/2021 1421   VLDL 9.0 01/26/2021 1421   LDLCALC 92 04/05/2022 0839   Hepatic Function Panel     Component Value Date/Time   PROT 7.2 04/05/2022 0839   ALBUMIN 4.4 04/05/2022 0839   AST 18 04/05/2022 0839   AST 15 09/23/2021 1440   ALT 13 04/05/2022 0839   ALT 12 09/23/2021 1440   ALKPHOS 66 04/05/2022 0839   BILITOT <0.2 04/05/2022 0839   BILITOT 0.4 09/23/2021 1440       Component Value Date/Time   TSH 1.250 04/05/2022 0839     ASSESSMENT AND PLAN  TREATMENT PLAN FOR OBESITY:  Recommended Dietary Goals  Jurney is currently in the contemplation stage of change. As such, her goal is to continue with weight loss efforts. She has agreed to keeping a food journal and adhering to recommended goals of 1500 calories and 90 g protein and practicing portion control and making smarter food choices, such as increasing vegetables and decreasing simple carbohydrates.  Because of time restriction today I will help her with journaling and food entry at the next visit.  Behavioral Intervention  We discussed the following Behavioral Modification Strategies today: increasing lean protein intake, increasing vegetables, increasing water intake, no skipping meals, meal planning and cooking strategies, keeping healthy foods in the home, planning for success, keeping a strict food journal, and decreasing junk food.  Additional resources provided today: NA  Recommended Physical Activity Goals  Camryn has been instructed to work up to 150 minutes of moderate intensity aerobic activity a week and strengthening exercises 2-3 times per week for cardiovascular health, weight loss maintenance and preservation of muscle mass.   She has agreed to increase physical activity in their day and reduce sedentary time (increase NEAT).    Pharmacotherapy We discussed various medication options to help Staci with her weight loss efforts and we both agreed to trying metformin. In addition to journaling and increasing physical activity I feel this patient would benefit from pharmacotherapy to assist with hunger signals, satiety and cravings.  After discussion of mechanisms of action, benefits, common side effects, off label use and shared decision making he is agreeable to starting metformin 500 mg in the evening  ASSOCIATED CONDITIONS ADDRESSED TODAY  Insulin resistance Assessment &  Plan: Her insulin levels are 15 optimal is less than 7.  She has a normal hemoglobin A1c.  This may contribute to abnormal cravings as well as fatigue.  We will continue to work on medical nutrition therapy.  In addition she is to start treatment with metformin as this will improve insulin sensitivity and assist patient with weight loss efforts.  Orders: -     metFORMIN HCl ER; Take 1 tablet (500 mg total) by mouth daily with breakfast.  Dispense: 30 tablet; Refill: 0  Vitamin D deficiency Assessment & Plan: Associated with excess adiposity and may result in leptin resistance and adipogenesis.  Currently on high-dose supplementation without any adverse effects.  Continue ergocalciferol 50,000 units 1 tablet weekly.  Recheck vitamin D levels in 3 months.  Orders: -     Ergocalciferol; Take 1 capsule (50,000 Units total) by mouth once a week.  Dispense: 12 capsule; Refill: 0  Obesity, currrent BMI 44.7      Return in about 2 weeks (around 07/07/2022).Marland Kitchen She was informed of the importance of frequent follow up visits to maximize her success with intensive lifestyle modifications for her multiple health conditions.   ATTESTASTION STATEMENTS:  Reviewed by clinician on day of visit: allergies, medications, problem list, medical history, surgical history, family history, social history, and previous encounter notes.   Time spent on visit including pre-visit chart review and post-visit care and charting was 25 minutes.    Thomes Dinning, MD

## 2022-06-23 NOTE — Assessment & Plan Note (Signed)
Associated with excess adiposity and may result in leptin resistance and adipogenesis.  Currently on high-dose supplementation without any adverse effects.  Continue ergocalciferol 50,000 units 1 tablet weekly.  Recheck vitamin D levels in 3 months.

## 2022-06-23 NOTE — Assessment & Plan Note (Signed)
Her insulin levels are 15 optimal is less than 7.  She has a normal hemoglobin A1c.  This may contribute to abnormal cravings as well as fatigue.  We will continue to work on medical nutrition therapy.  In addition she is to start treatment with metformin as this will improve insulin sensitivity and assist patient with weight loss efforts.

## 2022-07-07 ENCOUNTER — Encounter (INDEPENDENT_AMBULATORY_CARE_PROVIDER_SITE_OTHER): Payer: Self-pay | Admitting: Internal Medicine

## 2022-07-07 ENCOUNTER — Ambulatory Visit (INDEPENDENT_AMBULATORY_CARE_PROVIDER_SITE_OTHER): Payer: Medicaid Other | Admitting: Internal Medicine

## 2022-07-07 VITALS — BP 130/80 | HR 76 | Temp 98.1°F | Ht 65.0 in | Wt 272.0 lb

## 2022-07-07 DIAGNOSIS — E669 Obesity, unspecified: Secondary | ICD-10-CM | POA: Diagnosis not present

## 2022-07-07 DIAGNOSIS — E559 Vitamin D deficiency, unspecified: Secondary | ICD-10-CM

## 2022-07-07 DIAGNOSIS — E88819 Insulin resistance, unspecified: Secondary | ICD-10-CM | POA: Diagnosis not present

## 2022-07-07 DIAGNOSIS — Z6841 Body Mass Index (BMI) 40.0 and over, adult: Secondary | ICD-10-CM

## 2022-07-07 NOTE — Progress Notes (Signed)
Office: [phone]  /  Fax: [place]  No data recorded   HPI  Chief Complaint: OBESITY  Stacy Mills is here to discuss her progress with her obesity treatment plan. She is on the keeping a food journal and adhering to recommended goals of 1500 calories and unsure of the grams of protein and states she is following her eating plan approximately 70 % of the time. She states she is exercising 15 minutes 2-3 times per week.   Interval History:  Since last office visit she remains weight neutral.  She reports following prescribed nutritional plan but on 24-hour recall she is not consuming prescribed calories or meeting protein goals.  For breakfast she had peanut butter toast, for lunch she had 3 wings and for dinner she had a small steak with rice broccoli and Brussels sprouts.  She denies liquid calories.  She denies problems with appetite, satiety or abnormal eating patterns.  She has been started on metformin last office visit but she reports feels medication increases her appetite.   Pharmacotherapy: Metformin  PHYSICAL EXAM:  Blood pressure 130/80, pulse 76, temperature 98.1 F (36.7 C), height 5' 5"$  (1.651 m), weight 272 lb (123.4 kg), SpO2 98 %, unknown if currently breastfeeding. Body mass index is 45.26 kg/m.  General: She is overweight, cooperative, alert, well developed, and in no acute distress. PSYCH: Has normal mood, affect and thought process.   HEENT: EOMI, sclerae are anicteric. Lungs: Normal breathing effort, no conversational dyspnea. Extremities: No edema.  Neurologic: No gross sensory or motor deficits. No tremors or fasciculations noted.    DIAGNOSTIC DATA REVIEWED:  BMET    Component Value Date/Time   NA 140 04/05/2022 0839   K 4.7 04/05/2022 0839   CL 107 (H) 04/05/2022 0839   CO2 21 04/05/2022 0839   GLUCOSE 79 04/05/2022 0839   GLUCOSE 82 09/23/2021 1440   BUN 10 04/05/2022 0839   CREATININE 0.85 04/05/2022 0839    CREATININE 0.98 09/23/2021 1440   CREATININE 0.93 11/05/2012 1015   CALCIUM 9.3 04/05/2022 0839   GFRNONAA >60 09/23/2021 1440   GFRAA >60 05/31/2019 0417   Lab Results  Component Value Date   HGBA1C 5.2 04/05/2022   HGBA1C 5.4 05/09/2016   Lab Results  Component Value Date   INSULIN 15.5 04/05/2022   Lab Results  Component Value Date   TSH 1.250 04/05/2022   CBC    Component Value Date/Time   WBC 8.7 04/05/2022 0839   WBC 7.2 09/23/2021 1440   WBC 8.1 09/16/2021 0847   RBC 4.65 04/05/2022 0839   RBC 4.01 09/23/2021 1440   HGB 13.8 04/05/2022 0839   HCT 42.1 04/05/2022 0839   PLT 333 04/05/2022 0839   MCV 91 04/05/2022 0839   MCH 29.7 04/05/2022 0839   MCH 29.9 09/23/2021 1440   MCHC 32.8 04/05/2022 0839   MCHC 33.1 09/23/2021 1440   RDW 11.6 (L) 04/05/2022 0839   Iron Studies No results found for: "IRON", "TIBC", "FERRITIN", "IRONPCTSAT" Lipid Panel     Component Value Date/Time   CHOL 164 04/05/2022 0839   TRIG 57 04/05/2022 0839   HDL 61 04/05/2022 0839   CHOLHDL 3 01/26/2021 1421   VLDL 9.0 01/26/2021 1421   LDLCALC 92 04/05/2022 0839   Hepatic Function Panel     Component Value Date/Time   PROT 7.2 04/05/2022 0839   ALBUMIN 4.4 04/05/2022 0839   AST 18 04/05/2022 0839   AST 15 09/23/2021 1440  ALT 13 04/05/2022 0839   ALT 12 09/23/2021 1440   ALKPHOS 66 04/05/2022 0839   BILITOT <0.2 04/05/2022 0839   BILITOT 0.4 09/23/2021 1440      Component Value Date/Time   TSH 1.250 04/05/2022 0839   Nutritional Lab Results  Component Value Date   VD25OH 11.1 (L) 04/05/2022     ASSESSMENT AND PLAN  TREATMENT PLAN FOR OBESITY:  Recommended Dietary Goals  Jaynelle is currently in the action stage of change. As such, her goal is to continue weight management plan. She has agreed to the Category 3 Plan.  Behavioral Intervention  We discussed the following Behavioral Modification Strategies today: increasing lean protein intake, decreasing  simple carbohydrates , increasing vegetables, increasing fiber rich foods, avoid skipping meals, increase water intake, work on meal planning and easy cooking plans, avoid or reduce consumption of processed foods, and patient also counseled on the plate method today .  Additional resources provided today: NA  Recommended Physical Activity Goals  Trena has been advised to work up to 150 minutes of moderate intensity aerobic activity a week and strengthening exercises 2-3 times per week for cardiovascular health, weight loss maintenance and preservation of muscle mass.   She has agreed to increase physical activity in their day and reduce sedentary time (increase NEAT).    Pharmacotherapy We discussed various medication options to help Lillyrose with her weight loss efforts and we both agreed to discontinue metformin because of lack of therapeutic benefit.  We will consider additional pharmacotherapy at the next office visit.  ASSOCIATED CONDITIONS ADDRESSED TODAY  Insulin resistance Assessment & Plan: Her HOMA- IR is 3 consistent with severe insulin resistance. Goal < 1.9.  She was started on metformin but felt the medication was increasing her appetite we will therefore discontinue medication at present time.  She will continue to work on increasing protein in her diet and reducing simple sugars.   Obesity, currrent BMI 45  Vitamin D deficiency Assessment & Plan: Vitamin D levels were 11.1.  This is associated with excess adiposity and may result in leptin resistance and adipogenesis.  Currently on high-dose supplementation without any adverse effects.  Continue ergocalciferol 50,000 units 1 tablet weekly.  Check vitamin D levels in March       Return in about 2 weeks (around 07/21/2022) for For Weight Mangement with Dr. Gerarda Fraction.Marland Kitchen She was informed of the importance of frequent follow up visits to maximize her success with intensive lifestyle modifications for her multiple health  conditions.   ATTESTASTION STATEMENTS:  Reviewed by clinician on day of visit: allergies, medications, problem list, medical history, surgical history, family history, social history, and previous encounter notes.   Time spent on visit including pre-visit chart review and post-visit care and charting was 30 minutes.    Thomes Dinning, MD

## 2022-07-10 NOTE — Assessment & Plan Note (Signed)
Vitamin D levels were 11.1.  This is associated with excess adiposity and may result in leptin resistance and adipogenesis.  Currently on high-dose supplementation without any adverse effects.  Continue ergocalciferol 50,000 units 1 tablet weekly.  Check vitamin D levels in March

## 2022-07-10 NOTE — Assessment & Plan Note (Signed)
Her HOMA- IR is 3 consistent with severe insulin resistance. Goal < 1.9.  She was started on metformin but felt the medication was increasing her appetite we will therefore discontinue medication at present time.  She will continue to work on increasing protein in her diet and reducing simple sugars.

## 2022-07-28 ENCOUNTER — Ambulatory Visit (INDEPENDENT_AMBULATORY_CARE_PROVIDER_SITE_OTHER): Payer: Medicaid Other | Admitting: Internal Medicine

## 2022-07-28 ENCOUNTER — Encounter (INDEPENDENT_AMBULATORY_CARE_PROVIDER_SITE_OTHER): Payer: Self-pay | Admitting: Internal Medicine

## 2022-07-28 VITALS — HR 87 | Ht 65.0 in | Wt 274.0 lb

## 2022-07-28 DIAGNOSIS — R638 Other symptoms and signs concerning food and fluid intake: Secondary | ICD-10-CM | POA: Diagnosis not present

## 2022-07-28 DIAGNOSIS — E559 Vitamin D deficiency, unspecified: Secondary | ICD-10-CM

## 2022-07-28 DIAGNOSIS — Z5181 Encounter for therapeutic drug level monitoring: Secondary | ICD-10-CM

## 2022-07-28 DIAGNOSIS — Z6841 Body Mass Index (BMI) 40.0 and over, adult: Secondary | ICD-10-CM

## 2022-07-28 DIAGNOSIS — E669 Obesity, unspecified: Secondary | ICD-10-CM

## 2022-07-28 DIAGNOSIS — E88819 Insulin resistance, unspecified: Secondary | ICD-10-CM | POA: Diagnosis not present

## 2022-07-28 MED ORDER — ERGOCALCIFEROL 1.25 MG (50000 UT) PO CAPS: 50000.0000 [IU] | ORAL_CAPSULE | ORAL | 0 refills | Status: DC

## 2022-07-28 MED ORDER — TOPIRAMATE 25 MG PO TABS: 25.0000 mg | ORAL_TABLET | Freq: Every day | ORAL | 0 refills | Status: DC

## 2022-07-28 NOTE — Assessment & Plan Note (Signed)
Most recent vitamin D levels  Lab Results  Component Value Date   VD25OH 11.1 (L) 04/05/2022     Deficiency state associated with adiposity and may result in leptin resistance, weight gain and fatigue. Currently on vitamin D supplementation without any adverse effects.  Plan: Continue high-dose vitamin D supplementation.  Will check vitamin D levels today and consider transitioning to over-the-counter supplementation based on levels

## 2022-07-28 NOTE — Progress Notes (Signed)
Office: [phone]  /  Fax: [phone]  WEIGHT SUMMARY AND BIOMETRICS  Vitals Pulse Rate: 87 SpO2: 99 %   Anthropometric Measurements Height: '5\' 5"'$  (1.651 m) Weight: 274 lb (124.3 kg) BMI (Calculated): 45.6 Weight at Last Visit: 272 lb Weight Lost Since Last Visit: +2 Starting Weight: 267 lb Total Weight Loss (lbs): 0 lb (0 kg) Peak Weight: 280 lb   Body Composition  Body Fat %: 48.6 % Fat Mass (lbs): 133.2 lbs Muscle Mass (lbs): 133.2 lbs Total Body Water (lbs): 101.4 lbs Visceral Fat Rating : 14   Other Clinical Data Fasting: No Labs: No Today's Visit #: 6 Starting Date: 04/05/22    HPI  Chief Complaint: OBESITY  Stacy Mills is here to discuss her progress with her obesity treatment plan. She is on the keeping a food journal and adhering to recommended goals of 1500 calories and 120 grams of  protein daily and states she is following her eating plan approximately 45-50 % of the time. She states she is exercising 15-30 minutes 4 times per week.  Interval History:  Since last office visit she has gained 2 pounds. Her adherence to reduced calorie nutrition plan is hard to assess as she has not been counting calories.  She has been working on eating 3 meals a day.  Eats out possibly 2 times a week. 24 hour: peanut butter toast and ocassional eggs, eating what client eats for lunch, dinner baked chicken, rice and beans, broccoli, corn or green beans. Father cooks '[x]'$ Denies '[]'$ Reports problems with appetite and hunger signals.  '[x]'$ Denies '[]'$ Reports problems with satiety and satiation.  '[x]'$ Denies '[]'$ Reports problems with eating patterns and portion control.  '[]'$ Denies '[x]'$ Reports abnormal cravings  Barriers identified lack of time for self-care.   Pharmacotherapy for weight loss: She is currently taking no anti-obesity medication.  And had side effects to metformin in the form of increased hunger  Weight promoting medications identified: None.  ASSESSMENT AND  PLAN  TREATMENT PLAN FOR OBESITY:  Recommended Dietary Goals  Stacy Mills is currently in the action stage of change. As such, her goal is to continue weight management plan. She has agreed to: keeping a food journal and adhering to recommended goals of 1500 cal calories and 100 to 120 g of protein.  Behavioral Intervention  We discussed the following Behavioral Modification Strategies today: increasing lean protein intake, decreasing simple carbohydrates , increasing vegetables, increasing lower glycemic fruits, increasing fiber rich foods, avoiding skipping meals, increasing water intake, identifying sources and decreasing liquid calories, work on meal planning and easy cooking plans, work on tracking and journaling calories using tracking App, and reading food labels .  Additional resources provided today: NA  Recommended Physical Activity Goals  Stacy Mills has been advised to work up to 150 minutes of moderate intensity aerobic activity a week and strengthening exercises 2-3 times per week for cardiovascular health, weight loss maintenance and preservation of muscle mass.   She has agreed to : '[]'$  Continue current level of physical activity '[]'$  Start strengthening exercises with a goal of 2-3 sessions a week   '[]'$  Start aerobic activity with a goal of 150 minutes a week at moderate intensity. '[]'$ Increase intensity, frequency or duration of strengthening exercises '[x]'$ Increase the intensity, frequency or duration of aerobic exercises  '[]'$ Increase physical activity in their day and reduce sedentary time (increase NEAT). '[]'$  Work on scheduling and tracking physical activity.   Pharmacotherapy We discussed various medication options to help Stacy Mills with her weight loss efforts and we both agreed  to : '[]'$  Continue with nutritional and behavioral strategies '[]'$  Continue current antiobesity medication (AOM). '[x]'$ Start AOM after discussion of benefits, common side effects, contraindications and safety precautions she  has agreed to starting topiramate 25 mg in the morning.  She has an IUD and we will check pregnancy test.  I counseled her on medication being teratogenic and may also reduce effectiveness of birth control.  ASSOCIATED CONDITIONS ADDRESSED TODAY  Obesity, currrent BMI 45 -     Topiramate; Take 1 tablet (25 mg total) by mouth daily.  Dispense: 30 tablet; Refill: 0  Vitamin D deficiency Assessment & Plan: Most recent vitamin D levels  Lab Results  Component Value Date   VD25OH 11.1 (L) 04/05/2022     Deficiency state associated with adiposity and may result in leptin resistance, weight gain and fatigue. Currently on vitamin D supplementation without any adverse effects.  Plan: Continue high-dose vitamin D supplementation.  Will check vitamin D levels today and consider transitioning to over-the-counter supplementation based on levels   Orders: -     Ergocalciferol; Take 1 capsule (50,000 Units total) by mouth once a week.  Dispense: 12 capsule; Refill: 0 -     VITAMIN D 25 Hydroxy (Vit-D Deficiency, Fractures)  Insulin resistance Assessment & Plan: Patient did not tolerate treatment with metformin.  We discussed the importance of reducing simple and added sugars in her diet as well as increasing complex carbs and protein to reduce insulin resistance.  We also reviewed the benefits of physical activity in particular strengthening.   Abnormal craving Assessment & Plan: Neurohormonal.  After discussion of benefits and side effects we will start topiramate 25 mg in the morning.  We will monitor for side effects.  A serum pregnancy test will be completed.   Therapeutic drug monitoring -     Pregnancy, urine -     hCG, serum, qualitative     PHYSICAL EXAM:  Pulse 87, height '5\' 5"'$  (1.651 m), weight 274 lb (124.3 kg), SpO2 99 %, unknown if currently breastfeeding. Body mass index is 45.6 kg/m.  General: She is overweight, cooperative, alert, well developed, and in no acute  distress. PSYCH: Has normal mood, affect and thought process.   HEENT: EOMI, sclerae are anicteric. Lungs: Normal breathing effort, no conversational dyspnea. Extremities: No edema.  Neurologic: No gross sensory or motor deficits. No tremors or fasciculations noted.    DIAGNOSTIC DATA REVIEWED:  BMET    Component Value Date/Time   NA 140 04/05/2022 0839   K 4.7 04/05/2022 0839   CL 107 (H) 04/05/2022 0839   CO2 21 04/05/2022 0839   GLUCOSE 79 04/05/2022 0839   GLUCOSE 82 09/23/2021 1440   BUN 10 04/05/2022 0839   CREATININE 0.85 04/05/2022 0839   CREATININE 0.98 09/23/2021 1440   CREATININE 0.93 11/05/2012 1015   CALCIUM 9.3 04/05/2022 0839   GFRNONAA >60 09/23/2021 1440   GFRAA >60 05/31/2019 0417   Lab Results  Component Value Date   HGBA1C 5.2 04/05/2022   HGBA1C 5.4 05/09/2016   Lab Results  Component Value Date   INSULIN 15.5 04/05/2022   Lab Results  Component Value Date   TSH 1.250 04/05/2022   CBC    Component Value Date/Time   WBC 8.7 04/05/2022 0839   WBC 7.2 09/23/2021 1440   WBC 8.1 09/16/2021 0847   RBC 4.65 04/05/2022 0839   RBC 4.01 09/23/2021 1440   HGB 13.8 04/05/2022 0839   HCT 42.1 04/05/2022 0839   PLT 333  04/05/2022 0839   MCV 91 04/05/2022 0839   MCH 29.7 04/05/2022 0839   MCH 29.9 09/23/2021 1440   MCHC 32.8 04/05/2022 0839   MCHC 33.1 09/23/2021 1440   RDW 11.6 (L) 04/05/2022 0839   Iron Studies No results found for: "IRON", "TIBC", "FERRITIN", "IRONPCTSAT" Lipid Panel     Component Value Date/Time   CHOL 164 04/05/2022 0839   TRIG 57 04/05/2022 0839   HDL 61 04/05/2022 0839   CHOLHDL 3 01/26/2021 1421   VLDL 9.0 01/26/2021 1421   LDLCALC 92 04/05/2022 0839   Hepatic Function Panel     Component Value Date/Time   PROT 7.2 04/05/2022 0839   ALBUMIN 4.4 04/05/2022 0839   AST 18 04/05/2022 0839   AST 15 09/23/2021 1440   ALT 13 04/05/2022 0839   ALT 12 09/23/2021 1440   ALKPHOS 66 04/05/2022 0839   BILITOT <0.2  04/05/2022 0839   BILITOT 0.4 09/23/2021 1440      Component Value Date/Time   TSH 1.250 04/05/2022 0839   Nutritional Lab Results  Component Value Date   VD25OH 11.1 (L) 04/05/2022     Return in about 2 weeks (around 08/11/2022) for For Weight Mangement with Dr. Gerarda Fraction.Marland Kitchen She was informed of the importance of frequent follow up visits to maximize her success with intensive lifestyle modifications for her multiple health conditions.   ATTESTASTION STATEMENTS:  Reviewed by clinician on day of visit: allergies, medications, problem list, medical history, surgical history, family history, social history, and previous encounter notes.     Thomes Dinning, MD

## 2022-07-28 NOTE — Assessment & Plan Note (Signed)
Neurohormonal.  After discussion of benefits and side effects we will start topiramate 25 mg in the morning.  We will monitor for side effects.  A serum pregnancy test will be completed.

## 2022-07-28 NOTE — Assessment & Plan Note (Signed)
Patient did not tolerate treatment with metformin.  We discussed the importance of reducing simple and added sugars in her diet as well as increasing complex carbs and protein to reduce insulin resistance.  We also reviewed the benefits of physical activity in particular strengthening.

## 2022-07-29 LAB — VITAMIN D 25 HYDROXY (VIT D DEFICIENCY, FRACTURES): Vit D, 25-Hydroxy: 21.7 ng/mL — ABNORMAL LOW (ref 30.0–100.0)

## 2022-07-29 LAB — HCG, SERUM, QUALITATIVE: hCG,Beta Subunit,Qual,Serum: NEGATIVE m[IU]/mL (ref <6)

## 2022-08-04 ENCOUNTER — Ambulatory Visit: Payer: Medicaid Other | Admitting: Nurse Practitioner

## 2022-08-04 ENCOUNTER — Encounter: Payer: Self-pay | Admitting: Nurse Practitioner

## 2022-08-04 ENCOUNTER — Other Ambulatory Visit (HOSPITAL_COMMUNITY)
Admission: RE | Admit: 2022-08-04 | Discharge: 2022-08-04 | Disposition: A | Discharge status: Home / Self Care | Payer: Medicaid Other | Source: Ambulatory Visit | Attending: Nurse Practitioner | Admitting: Nurse Practitioner

## 2022-08-04 VITALS — BP 118/78 | HR 77 | Temp 98.6°F | Resp 16 | Ht 65.0 in | Wt 279.4 lb

## 2022-08-04 DIAGNOSIS — Z1159 Encounter for screening for other viral diseases: Secondary | ICD-10-CM

## 2022-08-04 DIAGNOSIS — J302 Other seasonal allergic rhinitis: Secondary | ICD-10-CM | POA: Diagnosis not present

## 2022-08-04 MED ORDER — CETIRIZINE HCL 10 MG PO TABS: ORAL_TABLET | ORAL | 1 refills | Status: DC

## 2022-08-04 NOTE — Progress Notes (Signed)
                Established Patient Visit  Patient: Stacy Mills   DOB: Sep 07, 1995   27 y.o. Female  MRN: [id number] Visit Date: 08/04/2022  Subjective:    Chief Complaint  Patient presents with   STD Check     Routine check- no symptoms today - Refill of zyrtec   HPI She request for STD screen. Denies any vaginal or urinary symptoms.  Wt Readings from Last 3 Encounters:  08/04/22 279 lb 6.4 oz (126.7 kg)  07/28/22 274 lb (124.3 kg)  07/07/22 272 lb (123.4 kg)    BP Readings from Last 3 Encounters:  08/04/22 118/78  07/07/22 130/80  06/23/22 118/84    Reviewed medical, surgical, and social history today  Medications: Outpatient Medications Prior to Visit  Medication Sig   levonorgestrel (MIRENA) 20 MCG/24HR IUD 1 each by Intrauterine route once.   metFORMIN (GLUCOPHAGE-XR) 500 MG 24 hr tablet Take 500 mg by mouth daily.   topiramate (TOPAMAX) 25 MG tablet Take 1 tablet (25 mg total) by mouth daily.   [DISCONTINUED] cetirizine (ZYRTEC) 10 MG tablet TAKE 1 TABLET BY MOUTH EVERYDAY AT BEDTIME   acetaminophen (TYLENOL) 325 MG tablet Take 650 mg by mouth every 6 (six) hours as needed for mild pain or moderate pain.   ergocalciferol (VITAMIN D2) 1.25 MG (50000 UT) capsule Take 1 capsule (50,000 Units total) by mouth once a week.   [DISCONTINUED] fluticasone (FLONASE) 50 MCG/ACT nasal spray SPRAY 2 SPRAYS INTO EACH NOSTRIL EVERY DAY (Patient not taking: Reported on 08/04/2022)   No facility-administered medications prior to visit.   Reviewed past medical and social history.   ROS per HPI above      Objective:  BP 118/78 (BP Location: Left Arm, Patient Position: Sitting, Cuff Size: Large)   Pulse 77   Temp 98.6 F (37 C) (Temporal)   Resp 16   Ht '5\' 5"'$  (1.651 m)   Wt 279 lb 6.4 oz (126.7 kg)   SpO2 98%   BMI 46.49 kg/m      Physical Exam Cardiovascular:     Rate and Rhythm: Normal rate.     Pulses: Normal pulses.  Pulmonary:     Effort: Pulmonary effort is normal.   Neurological:     Mental Status: She is alert and oriented to person, place, and time.     No results found for any visits on 08/04/22.    Assessment & Plan:    Problem List Items Addressed This Visit   None Visit Diagnoses     Encounter for screening for viral disease    -  Primary   Relevant Orders   HIV Antibody (routine testing w rflx)   RPR   Cervicovaginal ancillary only( [place])   Hepatitis C Antibody   Seasonal allergic rhinitis, unspecified trigger       Relevant Medications   cetirizine (ZYRTEC) 10 MG tablet      Return in about 6 months (around 02/04/2023) for CPE (fasting).     Wilfred Lacy, NP

## 2022-08-05 ENCOUNTER — Encounter: Payer: Self-pay | Admitting: Nurse Practitioner

## 2022-08-05 LAB — RPR: RPR Ser Ql: NONREACTIVE

## 2022-08-05 LAB — CERVICOVAGINAL ANCILLARY ONLY
Chlamydia: NEGATIVE
Comment: NEGATIVE
Comment: NEGATIVE
Comment: NORMAL
Neisseria Gonorrhea: NEGATIVE
Trichomonas: NEGATIVE

## 2022-08-05 LAB — HEPATITIS C ANTIBODY: Hepatitis C Ab: NONREACTIVE

## 2022-08-05 LAB — HIV ANTIBODY (ROUTINE TESTING W REFLEX): HIV 1&2 Ab, 4th Generation: NONREACTIVE

## 2022-08-05 NOTE — Progress Notes (Signed)
Stable Follow instructions as discussed during office visit.

## 2022-08-11 ENCOUNTER — Ambulatory Visit (INDEPENDENT_AMBULATORY_CARE_PROVIDER_SITE_OTHER): Payer: Medicaid Other | Admitting: Internal Medicine

## 2022-08-11 ENCOUNTER — Encounter (INDEPENDENT_AMBULATORY_CARE_PROVIDER_SITE_OTHER): Payer: Self-pay | Admitting: Internal Medicine

## 2022-08-11 VITALS — BP 117/77 | HR 80 | Temp 98.0°F | Ht 65.0 in | Wt 279.0 lb

## 2022-08-11 DIAGNOSIS — R638 Other symptoms and signs concerning food and fluid intake: Secondary | ICD-10-CM | POA: Diagnosis not present

## 2022-08-11 DIAGNOSIS — E669 Obesity, unspecified: Secondary | ICD-10-CM | POA: Diagnosis not present

## 2022-08-11 DIAGNOSIS — E559 Vitamin D deficiency, unspecified: Secondary | ICD-10-CM

## 2022-08-11 DIAGNOSIS — E88819 Insulin resistance, unspecified: Secondary | ICD-10-CM | POA: Diagnosis not present

## 2022-08-11 DIAGNOSIS — Z6841 Body Mass Index (BMI) 40.0 and over, adult: Secondary | ICD-10-CM

## 2022-08-11 MED ORDER — TOPIRAMATE 50 MG PO TABS: 50.0000 mg | ORAL_TABLET | Freq: Every day | ORAL | 0 refills | Status: DC

## 2022-08-11 MED ORDER — ERGOCALCIFEROL 1.25 MG (50000 UT) PO CAPS: 50000.0000 [IU] | ORAL_CAPSULE | ORAL | 0 refills | Status: DC

## 2022-08-11 NOTE — Assessment & Plan Note (Signed)
Most recent vitamin D levels  Lab Results  Component Value Date   VD25OH 21.7 (L) 07/28/2022   VD25OH 11.1 (L) 04/05/2022     Deficiency state associated with adiposity and may result in leptin resistance, weight gain and fatigue. Currently on vitamin D supplementation without any adverse effects.  Plan: Continue vitamin D supplementation.  Check vitamin D levels in June

## 2022-08-11 NOTE — Assessment & Plan Note (Signed)
Patient did not tolerate treatment with metformin.  We discussed the importance of reducing simple and added sugars in her diet as well as increasing complex carbs and protein to reduce insulin resistance.  We also reviewed the benefits of physical activity in particular strengthening. 

## 2022-08-11 NOTE — Assessment & Plan Note (Signed)
Neurohormonal.  Has had some response to topiramate we will increase dose to 50 mg a day.  Counseled on medication side effects.  She has an IUD.

## 2022-08-11 NOTE — Progress Notes (Signed)
Office: [phone]  /  Fax: [phone]  WEIGHT SUMMARY AND BIOMETRICS  Vitals Temp: 69 F (36.7 C) BP: 117/77 Pulse Rate: 80 SpO2: 99 %   Anthropometric Measurements Height: '5\' 5"'$  (1.651 m) Weight: 279 lb (126.6 kg) BMI (Calculated): 46.43 Weight at Last Visit: 274 lb Weight Lost Since Last Visit: 0 Weight Gained Since Last Visit: 0   Body Composition  Body Fat %: 49.1 % Fat Mass (lbs): 137.2 lbs Muscle Mass (lbs): 135.2 lbs Total Body Water (lbs): 103 lbs Visceral Fat Rating : 14    HPI  Chief Complaint: OBESITY  Stacy Mills is here to discuss her progress with her obesity treatment plan. She is on the keeping a food journal and adhering to recommended goals of 1500 calories and 100 protein and states she is following her eating plan approximately 25 % of the time. She states she is exercising various from 30-60 minutes 3 times per week by walking.  Interval History:  Since last office visit she has remained weight neutral. She reports suboptimal adherence due to difficulty with understanding and implementing nutritional approach.  She is also relying on prepackaged foods or takeout. 24-hour recall: She had regular yogurt with some strawberries for breakfast, lunch had an Atkins protein shake with about 15 g of protein.  She had dinner at the [facility] had half a waffle with bacon and 2 eggs.  She denies snacking or consumption of liquid calories. '[]'$ Denies '[x]'$ Reports problems with appetite and hunger signals.  '[x]'$ Denies '[]'$ Reports problems with satiety and satiation.  '[x]'$ Denies '[]'$ Reports problems with eating patterns and portion control.  '[x]'$ Denies '[]'$ Reports abnormal cravings Stress levels are reported as low and manageable.  Barriers identified cost of healthy foods, inability to cook healthy meals, inability do do meal prep and planning, and inability to focus on healthy eating.   Pharmacotherapy for weight loss: She is currently taking Topiramate (off label  use, single agent). Has noticed some improvement in appetite.   ASSESSMENT AND PLAN  TREATMENT PLAN FOR OBESITY:  Recommended Dietary Goals  [name] is currently in the action stage of change. As such, her goal is to continue weight management plan. She has agreed to: Transition to practicing portion control and making smarter food choices, such as increasing vegetables and decreasing simple carbohydrates.  Behavioral Intervention  We discussed the following Behavioral Modification Strategies today: increasing lean protein intake, increasing vegetables, avoiding skipping meals, increasing water intake, and work on meal planning and easy cooking plans.  She was also provided with breakfast and lunch ideas.  We also reviewed again sources of micronutrients she was not sure of sources of protein  Additional resources provided today: Handout on healthy eating and balanced plate and Handout on complex carbohydrates and lean sources of protein  Recommended Physical Activity Goals  [name] has been advised to work up to 150 minutes of moderate intensity aerobic activity a week and strengthening exercises 2-3 times per week for cardiovascular health, weight loss maintenance and preservation of muscle mass.   She has agreed to :  Think about ways to increase physical activity  Pharmacotherapy We discussed various medication options to help Harriet with her weight loss efforts and we both agreed to : increase topiramate to 50 mg in the morning if we do not see a response we will discontinue medication  ASSOCIATED CONDITIONS ADDRESSED TODAY  Insulin resistance Assessment & Plan: Patient did not tolerate treatment with metformin.  We discussed the importance of reducing simple and added sugars in her  diet as well as increasing complex carbs and protein to reduce insulin resistance.  We also reviewed the benefits of physical activity in particular strengthening.   Obesity, currrent BMI 45 -      Topiramate; Take 1 tablet (50 mg total) by mouth daily.  Dispense: 30 tablet; Refill: 0  Vitamin D deficiency Assessment & Plan: Most recent vitamin D levels  Lab Results  Component Value Date   VD25OH 21.7 (L) 07/28/2022   VD25OH 11.1 (L) 04/05/2022     Deficiency state associated with adiposity and may result in leptin resistance, weight gain and fatigue. Currently on vitamin D supplementation without any adverse effects.  Plan: Continue vitamin D supplementation.  Check vitamin D levels in June   Orders: -     Ergocalciferol; Take 1 capsule (50,000 Units total) by mouth once a week.  Dispense: 12 capsule; Refill: 0  Abnormal craving Assessment & Plan: Neurohormonal.  Has had some response to topiramate we will increase dose to 50 mg a day.  Counseled on medication side effects.  She has an IUD.      PHYSICAL EXAM:  Blood pressure 117/77, pulse 80, temperature 98 F (36.7 C), height '5\' 5"'$  (1.651 m), weight 279 lb (126.6 kg), SpO2 99 %, unknown if currently breastfeeding. Body mass index is 46.43 kg/m.  General: She is overweight, cooperative, alert, well developed, and in no acute distress. PSYCH: Has normal mood, affect and thought process.   HEENT: EOMI, sclerae are anicteric. Lungs: Normal breathing effort, no conversational dyspnea. Extremities: No edema.  Neurologic: No gross sensory or motor deficits. No tremors or fasciculations noted.    DIAGNOSTIC DATA REVIEWED:  BMET    Component Value Date/Time   NA 140 04/05/2022 0839   K 4.7 04/05/2022 0839   CL 107 (H) 04/05/2022 0839   CO2 21 04/05/2022 0839   GLUCOSE 79 04/05/2022 0839   GLUCOSE 82 09/23/2021 1440   BUN 10 04/05/2022 0839   CREATININE 0.85 04/05/2022 0839   CREATININE 0.98 09/23/2021 1440   CREATININE 0.93 11/05/2012 1015   CALCIUM 9.3 04/05/2022 0839   GFRNONAA >60 09/23/2021 1440   GFRAA >60 05/31/2019 0417   Lab Results  Component Value Date   HGBA1C 5.2 04/05/2022   HGBA1C 5.4  05/09/2016   Lab Results  Component Value Date   INSULIN 15.5 04/05/2022   Lab Results  Component Value Date   TSH 1.250 04/05/2022   CBC    Component Value Date/Time   WBC 8.7 04/05/2022 0839   WBC 7.2 09/23/2021 1440   WBC 8.1 09/16/2021 0847   RBC 4.65 04/05/2022 0839   RBC 4.01 09/23/2021 1440   HGB 13.8 04/05/2022 0839   HCT 42.1 04/05/2022 0839   PLT 333 04/05/2022 0839   MCV 91 04/05/2022 0839   MCH 29.7 04/05/2022 0839   MCH 29.9 09/23/2021 1440   MCHC 32.8 04/05/2022 0839   MCHC 33.1 09/23/2021 1440   RDW 11.6 (L) 04/05/2022 0839   Iron Studies No results found for: "IRON", "TIBC", "FERRITIN", "IRONPCTSAT" Lipid Panel     Component Value Date/Time   CHOL 164 04/05/2022 0839   TRIG 57 04/05/2022 0839   HDL 61 04/05/2022 0839   CHOLHDL 3 01/26/2021 1421   VLDL 9.0 01/26/2021 1421   LDLCALC 92 04/05/2022 0839   Hepatic Function Panel     Component Value Date/Time   PROT 7.2 04/05/2022 0839   ALBUMIN 4.4 04/05/2022 0839   AST 18 04/05/2022 0839   AST  15 09/23/2021 1440   ALT 13 04/05/2022 0839   ALT 12 09/23/2021 1440   ALKPHOS 66 04/05/2022 0839   BILITOT <0.2 04/05/2022 0839   BILITOT 0.4 09/23/2021 1440      Component Value Date/Time   TSH 1.250 04/05/2022 0839   Nutritional Lab Results  Component Value Date   VD25OH 21.7 (L) 07/28/2022   VD25OH 11.1 (L) 04/05/2022     Return in about 3 weeks (around 09/01/2022) for For Weight Mangement with Dr. Gerarda Fraction or Raquel Sarna.. She was informed of the importance of frequent follow up visits to maximize her success with intensive lifestyle modifications for her multiple health conditions.   ATTESTASTION STATEMENTS:  Reviewed by clinician on day of visit: allergies, medications, problem list, medical history, surgical history, family history, social history, and previous encounter notes.     Thomes Dinning, MD

## 2022-08-26 ENCOUNTER — Other Ambulatory Visit (INDEPENDENT_AMBULATORY_CARE_PROVIDER_SITE_OTHER): Payer: Self-pay | Admitting: Internal Medicine

## 2022-09-01 ENCOUNTER — Encounter (INDEPENDENT_AMBULATORY_CARE_PROVIDER_SITE_OTHER): Payer: Self-pay | Admitting: Internal Medicine

## 2022-09-01 ENCOUNTER — Ambulatory Visit (INDEPENDENT_AMBULATORY_CARE_PROVIDER_SITE_OTHER): Payer: Medicaid Other | Admitting: Internal Medicine

## 2022-09-01 VITALS — BP 118/79 | HR 85 | Temp 97.8°F | Ht 65.0 in | Wt 284.0 lb

## 2022-09-01 DIAGNOSIS — Z6841 Body Mass Index (BMI) 40.0 and over, adult: Secondary | ICD-10-CM | POA: Diagnosis not present

## 2022-09-01 DIAGNOSIS — E669 Obesity, unspecified: Secondary | ICD-10-CM

## 2022-09-01 DIAGNOSIS — R638 Other symptoms and signs concerning food and fluid intake: Secondary | ICD-10-CM | POA: Diagnosis not present

## 2022-09-01 DIAGNOSIS — E88819 Insulin resistance, unspecified: Secondary | ICD-10-CM | POA: Diagnosis not present

## 2022-09-01 MED ORDER — TOPIRAMATE 50 MG PO TABS: 50.0000 mg | ORAL_TABLET | Freq: Every day | ORAL | 0 refills | Status: DC

## 2022-09-01 NOTE — Assessment & Plan Note (Signed)
Patient did not tolerate treatment with metformin.  We discussed the importance of reducing simple and added sugars in her diet as well as increasing complex carbs and protein to reduce insulin resistance.  We also reviewed the benefits of physical activity in particular strengthening. 

## 2022-09-01 NOTE — Assessment & Plan Note (Signed)
Neurohormonal.  Has had some response to topiramate we will increase dose to 50 mg a day.  Counseled on medication side effects.  She has an IUD. 

## 2022-09-01 NOTE — Progress Notes (Signed)
Office: [phone]  /  Fax: [phone]  WEIGHT SUMMARY AND BIOMETRICS  Vitals Temp: 97.8 F (36.6 C) BP: 118/79 Pulse Rate: 85 SpO2: 98 %   Anthropometric Measurements Height: 5\' 5"  (1.651 m) Weight: 284 lb (128.8 kg) BMI (Calculated): 47.26 Weight at Last Visit: 279 lb Weight Gained Since Last Visit: 5 lbs Starting Weight: 267 lb Total Weight Loss (lbs): 0 lb (0 kg) Peak Weight: 280 lb   Body Composition  Body Fat %: 50.7 % Fat Mass (lbs): 144.2 lbs Muscle Mass (lbs): 133.2 lbs Total Body Water (lbs): 106.2 lbs Visceral Fat Rating : 15    HPI  Chief Complaint: OBESITY  Stacy Mills is here to discuss her progress with her obesity treatment plan. She is on the keeping a food journal and adhering to recommended goals of 1500 calories and 100 protein and states she is following her eating plan approximately 30 % of the time. She states she is exercising 30 minutes 3 times per week.  Interval History:  Since last office visit she has gained 5 lbs. Did not start topiramate 50 mg a day as instructed. She reports suboptimal adherence due to stress eating  also not journaling and eating highly processed foods. Has difficulty understanding basic concepts of healthy eating.  Reports problems with appetite and hunger signals.  Denies problems with satiety and satiation.  Reports problems with eating patterns and portion control.  Reports abnormal cravings. Denies feeling deprived or restricted.  Eating processed foods.    Barriers identified: having difficulty preparing healthy meals, having difficulty with meal prep and planning, inability to focus on healthy eating, exposure to enticing environments and or relationships, predilection for convenience or prepackaged foods, strong hunger signals and food noise , and contemplative .   Pharmacotherapy for weight loss: She is currently taking no anti-obesity medication and Topiramate (off label use, single agent).     ASSESSMENT AND PLAN  TREATMENT PLAN FOR OBESITY:  Recommended Dietary Goals  Arshia is currently in the action stage of change. As such, her goal is to continue weight management plan. She has agreed to: continue current plan  Behavioral Intervention  We discussed the following Behavioral Modification Strategies today: increasing lean protein intake, decreasing simple carbohydrates , increasing vegetables, increasing lower glycemic fruits, increasing fiber rich foods, increasing water intake, work on meal planning and preparation, reading food labels , identifying sources and decreasing liquid calories, decreasing eating out or consumption of processed foods, and making healthy choices when eating convenient foods, avoiding temptations and identifying enticing environmental cues, planning for success, and better snacking choices.  Additional resources provided today: None  Recommended Physical Activity Goals  Kylin has been advised to work up to 150 minutes of moderate intensity aerobic activity a week and strengthening exercises 2-3 times per week for cardiovascular health, weight loss maintenance and preservation of muscle mass.   She has agreed to :  Think about ways to increase physical activity  Pharmacotherapy We discussed various medication options to help Louanne with her weight loss efforts and we both agreed to :  Patient is strongly encouraged to restart topiramate at 50 mg in the morning.  She had a negative pregnancy test and has an IUD.  ASSOCIATED CONDITIONS ADDRESSED TODAY  Abnormal craving Assessment & Plan: Neurohormonal.  Has had some response to topiramate we will increase dose to 50 mg a day.  Counseled on medication side effects.  She has an IUD.   Obesity, currrent BMI 45 -  Topiramate; Take 1 tablet (50 mg total) by mouth daily.  Dispense: 30 tablet; Refill: 0  Insulin resistance Assessment & Plan: Patient did not tolerate treatment with  metformin.  We discussed the importance of reducing simple and added sugars in her diet as well as increasing complex carbs and protein to reduce insulin resistance.  We also reviewed the benefits of physical activity in particular strengthening.      PHYSICAL EXAM:  Blood pressure 118/79, pulse 85, temperature 97.8 F (36.6 C), height 5\' 5"  (1.651 m), weight 284 lb (128.8 kg), SpO2 98 %, unknown if currently breastfeeding. Body mass index is 47.26 kg/m.  General: She is overweight, cooperative, alert, well developed, and in no acute distress. PSYCH: Has normal mood, affect and thought process.   HEENT: EOMI, sclerae are anicteric. Lungs: Normal breathing effort, no conversational dyspnea. Extremities: No edema.  Neurologic: No gross sensory or motor deficits. No tremors or fasciculations noted.    DIAGNOSTIC DATA REVIEWED:  BMET    Component Value Date/Time   NA 140 04/05/2022 0839   K 4.7 04/05/2022 0839   CL 107 (H) 04/05/2022 0839   CO2 21 04/05/2022 0839   GLUCOSE 79 04/05/2022 0839   GLUCOSE 82 09/23/2021 1440   BUN 10 04/05/2022 0839   CREATININE 0.85 04/05/2022 0839   CREATININE 0.98 09/23/2021 1440   CREATININE 0.93 11/05/2012 1015   CALCIUM 9.3 04/05/2022 0839   GFRNONAA >60 09/23/2021 1440   GFRAA >60 05/31/2019 0417   Lab Results  Component Value Date   HGBA1C 5.2 04/05/2022   HGBA1C 5.4 05/09/2016   Lab Results  Component Value Date   INSULIN 15.5 04/05/2022   Lab Results  Component Value Date   TSH 1.250 04/05/2022   CBC    Component Value Date/Time   WBC 8.7 04/05/2022 0839   WBC 7.2 09/23/2021 1440   WBC 8.1 09/16/2021 0847   RBC 4.65 04/05/2022 0839   RBC 4.01 09/23/2021 1440   HGB 13.8 04/05/2022 0839   HCT 42.1 04/05/2022 0839   PLT 333 04/05/2022 0839   MCV 91 04/05/2022 0839   MCH 29.7 04/05/2022 0839   MCH 29.9 09/23/2021 1440   MCHC 32.8 04/05/2022 0839   MCHC 33.1 09/23/2021 1440   RDW 11.6 (L) 04/05/2022 0839   Iron  Studies No results found for: "IRON", "TIBC", "FERRITIN", "IRONPCTSAT" Lipid Panel     Component Value Date/Time   CHOL 164 04/05/2022 0839   TRIG 57 04/05/2022 0839   HDL 61 04/05/2022 0839   CHOLHDL 3 01/26/2021 1421   VLDL 9.0 01/26/2021 1421   LDLCALC 92 04/05/2022 0839   Hepatic Function Panel     Component Value Date/Time   PROT 7.2 04/05/2022 0839   ALBUMIN 4.4 04/05/2022 0839   AST 18 04/05/2022 0839   AST 15 09/23/2021 1440   ALT 13 04/05/2022 0839   ALT 12 09/23/2021 1440   ALKPHOS 66 04/05/2022 0839   BILITOT <0.2 04/05/2022 0839   BILITOT 0.4 09/23/2021 1440      Component Value Date/Time   TSH 1.250 04/05/2022 0839   Nutritional Lab Results  Component Value Date   VD25OH 21.7 (L) 07/28/2022   VD25OH 11.1 (L) 04/05/2022     Return in about 2 weeks (around 09/15/2022) for 2 weeks with Shawn and 4 weeks with Dr. Gerarda Fraction.Marland Kitchen She was informed of the importance of frequent follow up visits to maximize her success with intensive lifestyle modifications for her multiple health conditions.   ATTESTASTION  STATEMENTS:  Reviewed by clinician on day of visit: allergies, medications, problem list, medical history, surgical history, family history, social history, and previous encounter notes.     Thomes Dinning, MD

## 2022-09-04 ENCOUNTER — Encounter: Payer: Self-pay | Admitting: Nurse Practitioner

## 2022-09-04 DIAGNOSIS — N898 Other specified noninflammatory disorders of vagina: Secondary | ICD-10-CM

## 2022-09-13 ENCOUNTER — Ambulatory Visit (INDEPENDENT_AMBULATORY_CARE_PROVIDER_SITE_OTHER): Payer: Medicaid Other | Admitting: Nurse Practitioner

## 2022-09-13 ENCOUNTER — Encounter: Payer: Self-pay | Admitting: Nurse Practitioner

## 2022-09-13 ENCOUNTER — Other Ambulatory Visit (HOSPITAL_COMMUNITY)
Admission: RE | Admit: 2022-09-13 | Discharge: 2022-09-13 | Disposition: A | Discharge status: Home / Self Care | Payer: Medicaid Other | Source: Ambulatory Visit | Attending: Nurse Practitioner | Admitting: Nurse Practitioner

## 2022-09-13 VITALS — BP 120/84 | HR 75 | Temp 98.2°F | Resp 16 | Ht 65.0 in | Wt 289.0 lb

## 2022-09-13 DIAGNOSIS — E88819 Insulin resistance, unspecified: Secondary | ICD-10-CM

## 2022-09-13 DIAGNOSIS — Z23 Encounter for immunization: Secondary | ICD-10-CM | POA: Diagnosis not present

## 2022-09-13 DIAGNOSIS — B9689 Other specified bacterial agents as the cause of diseases classified elsewhere: Secondary | ICD-10-CM

## 2022-09-13 DIAGNOSIS — Z1159 Encounter for screening for other viral diseases: Secondary | ICD-10-CM | POA: Diagnosis not present

## 2022-09-13 DIAGNOSIS — N760 Acute vaginitis: Secondary | ICD-10-CM | POA: Diagnosis not present

## 2022-09-13 DIAGNOSIS — Z0000 Encounter for general adult medical examination without abnormal findings: Secondary | ICD-10-CM

## 2022-09-13 LAB — COMPREHENSIVE METABOLIC PANEL
ALT: 13 U/L (ref 0–35)
AST: 14 U/L (ref 0–37)
Albumin: 4.1 g/dL (ref 3.5–5.2)
Alkaline Phosphatase: 53 U/L (ref 39–117)
BUN: 16 mg/dL (ref 6–23)
CO2: 26 mEq/L (ref 19–32)
Calcium: 9.2 mg/dL (ref 8.4–10.5)
Chloride: 106 mEq/L (ref 96–112)
Creatinine, Ser: 0.86 mg/dL (ref 0.40–1.20)
GFR: 92.08 mL/min (ref >60.00)
Glucose, Bld: 80 mg/dL (ref 70–99)
Potassium: 3.9 mEq/L (ref 3.5–5.1)
Sodium: 139 mEq/L (ref 135–145)
Total Bilirubin: 0.3 mg/dL (ref 0.2–1.2)
Total Protein: 7 g/dL (ref 6.0–8.3)

## 2022-09-13 LAB — HEMOGLOBIN A1C: Hgb A1c MFr Bld: 5.2 % (ref 4.6–6.5)

## 2022-09-13 NOTE — Patient Instructions (Signed)
Go to lab Maintain Heart healthy diet and daily exercise. Maintain current medications.  Preventive Care 87-28 Years Old, Female Preventive care refers to lifestyle choices and visits with your health care provider that can promote health and wellness. Preventive care visits are also called wellness exams. What can I expect for my preventive care visit? Counseling During your preventive care visit, your health care provider may ask about your: Medical history, including: Past medical problems. Family medical history. Pregnancy history. Current health, including: Menstrual cycle. Method of birth control. Emotional well-being. Home life and relationship well-being. Sexual activity and sexual health. Lifestyle, including: Alcohol, nicotine or tobacco, and drug use. Access to firearms. Diet, exercise, and sleep habits. Work and work Astronomer. Sunscreen use. Safety issues such as seatbelt and bike helmet use. Physical exam Your health care provider may check your: Height and weight. These may be used to calculate your BMI (body mass index). BMI is a measurement that tells if you are at a healthy weight. Waist circumference. This measures the distance around your waistline. This measurement also tells if you are at a healthy weight and may help predict your risk of certain diseases, such as type 2 diabetes and high blood pressure. Heart rate and blood pressure. Body temperature. Skin for abnormal spots. What immunizations do I need?  Vaccines are usually given at various ages, according to a schedule. Your health care provider will recommend vaccines for you based on your age, medical history, and lifestyle or other factors, such as travel or where you work. What tests do I need? Screening Your health care provider may recommend screening tests for certain conditions. This may include: Pelvic exam and Pap test. Lipid and cholesterol levels. Diabetes screening. This is done by  checking your blood sugar (glucose) after you have not eaten for a while (fasting). Hepatitis B test. Hepatitis C test. HIV (human immunodeficiency virus) test. STI (sexually transmitted infection) testing, if you are at risk. BRCA-related cancer screening. This may be done if you have a family history of breast, ovarian, tubal, or peritoneal cancers. Talk with your health care provider about your test results, treatment options, and if necessary, the need for more tests. Follow these instructions at home: Eating and drinking  Eat a healthy diet that includes fresh fruits and vegetables, whole grains, lean protein, and low-fat dairy products. Take vitamin and mineral supplements as recommended by your health care provider. Do not drink alcohol if: Your health care provider tells you not to drink. You are pregnant, may be pregnant, or are planning to become pregnant. If you drink alcohol: Limit how much you have to 0-1 drink a day. Know how much alcohol is in your drink. In the [place] one drink equals one 12 oz bottle of beer (355 mL), one 5 oz glass of wine (148 mL), or one 1 oz glass of hard liquor (44 mL). Lifestyle Brush your teeth every morning and night with fluoride toothpaste. Floss one time each day. Exercise for at least 30 minutes 5 or more days each week. Do not use any products that contain nicotine or tobacco. These products include cigarettes, chewing tobacco, and vaping devices, such as e-cigarettes. If you need help quitting, ask your health care provider. Do not use drugs. If you are sexually active, practice safe sex. Use a condom or other form of protection to prevent STIs. If you do not wish to become pregnant, use a form of birth control. If you plan to become pregnant, see your health care  provider for a prepregnancy visit. Find healthy ways to manage stress, such as: Meditation, yoga, or listening to music. Journaling. Talking to a trusted person. Spending time  with friends and family. Minimize exposure to UV radiation to reduce your risk of skin cancer. Safety Always wear your seat belt while driving or riding in a vehicle. Do not drive: If you have been drinking alcohol. Do not ride with someone who has been drinking. If you have been using any mind-altering substances or drugs. While texting. When you are tired or distracted. Wear a helmet and other protective equipment during sports activities. If you have firearms in your house, make sure you follow all gun safety procedures. Seek help if you have been physically or sexually abused. What's next? Go to your health care provider once a year for an annual wellness visit. Ask your health care provider how often you should have your eyes and teeth checked. Stay up to date on all vaccines. This information is not intended to replace advice given to you by your health care provider. Make sure you discuss any questions you have with your health care provider. Document Revised: 11/11/2020 Document Reviewed: 11/11/2020 Elsevier Patient Education  2023 [employer].

## 2022-09-13 NOTE — Progress Notes (Signed)
Stable Follow instructions as discussed during office visit.

## 2022-09-13 NOTE — Progress Notes (Signed)
Complete physical exam  Patient: Stacy Mills   DOB: 1996-02-27   27 y.o. Female  MRN: [id number] Visit Date: 09/13/2022  Subjective:    Chief Complaint  Patient presents with   Annual Exam    Fasting- Yes    Stacy Mills is a 27 y.o. female who presents today for a complete physical exam. She reports consuming a general diet.  Walking some days  She generally feels well. She reports sleeping well. She does not have additional problems to discuss today.  Vision:Yes Dental:No STD Screen:Yes  BP Readings from Last 3 Encounters:  09/13/22 120/84  09/01/22 118/79  08/11/22 117/77   Wt Readings from Last 3 Encounters:  09/13/22 289 lb (131.1 kg)  09/01/22 284 lb (128.8 kg)  08/11/22 279 lb (126.6 kg)   Most recent fall risk assessment:    08/04/2022   11:02 AM  Fall Risk   Falls in the past year? 0  Number falls in past yr: 0  Injury with Fall? 0  Risk for fall due to : No Fall Risks  Follow up Falls evaluation completed   Depression screen:Yes - No Depression  Most recent depression screenings:    08/04/2022   11:02 AM 04/05/2022    7:07 AM  PHQ 2/9 Scores  PHQ - 2 Score 0 3  PHQ- 9 Score  12   HPI  No problem-specific Assessment & Plan notes found for this encounter.   Past Medical History:  Diagnosis Date   Allergy    Anxiety    Back pain    Depression    Gallbladder problem    HSV (herpes simplex virus) anogenital infection    IIH (idiopathic intracranial hypertension)    IUD (intrauterine device) in place    Paraguard placed 2014, removed 10/2015   Migraine    Pelvic inflammation in female    Pelvic inflammatory disease 10/2012   Past Surgical History:  Procedure Laterality Date   CESAREAN SECTION N/A 08/19/2018   Procedure: CESAREAN SECTION;  Surgeon: Jaymes Graff, MD;  Location: [facility] ORS;  Service: Obstetrics;  Laterality: N/A;   CHOLECYSTECTOMY     spinal tap     Social History   Socioeconomic History   Marital status: Single     Spouse name: n/a   Number of children: 0   Years of education: 12+   Highest education level: Not on file  Occupational History   Occupation: Archivist   Occupation: PCA  Tobacco Use   Smoking status: Former   Smokeless tobacco: Never  Building services engineer Use: Never used  Substance and Sexual Activity   Alcohol use: Yes    Alcohol/week: 2.0 standard drinks of alcohol    Types: 2 Shots of liquor per week    Comment: social   Drug use: Not Currently    Types: Marijuana   Sexual activity: Yes    Partners: Male    Birth control/protection: Condom, I.U.D.    Comment: intercourse age 63, more than 5 sexual partners  Other Topics Concern   Not on file  Social History Narrative   Graduated from high school 10/2012, currently attending college.   Right-handed   Caffeine: 3x/wk   Social Determinants of Health   Financial Resource Strain: Low Risk  (08/14/2018)   Overall Financial Resource Strain (CARDIA)    Difficulty of Paying Living Expenses: Not hard at all  Food Insecurity: No Food Insecurity (08/14/2018)   Hunger Vital Sign    Worried  About Running Out of Food in the Last Year: Never true    Ran Out of Food in the Last Year: Never true  Transportation Needs: Unknown (08/14/2018)   PRAPARE - Administrator, Civil Service (Medical): No    Lack of Transportation (Non-Medical): Not on file  Physical Activity: Not on file  Stress: No Stress Concern Present (08/14/2018)   [employer] of Occupational Health - Occupational Stress Questionnaire    Feeling of Stress : Not at all  Social Connections: Not on file  Intimate Partner Violence: Not At Risk (08/14/2018)   Humiliation, Afraid, Rape, and Kick questionnaire    Fear of Current or Ex-Partner: No    Emotionally Abused: No    Physically Abused: No    Sexually Abused: No   Family Status  Relation Name Status   Mother Judeth Cornfield Alive   Father Pauletta Browns   Sister  Alive   MGM  Alive   MGF Brett Canales Alive    PGM Rose Deceased   PGF  Alive   Mat Aunt Brooke Alive   Sister [place] (Not Specified)   Son Jeannett Senior (Not Specified)   Family History  Problem Relation Age of Onset   Asthma Mother    High blood pressure Mother    Obesity Mother    Varicose Veins Mother    Hypertension Father    Alcoholism Father    Drug abuse Father    Alcohol abuse Father    Emphysema Maternal Grandfather    Depression Maternal Grandfather    Anxiety disorder Maternal Grandfather    Mental illness Maternal Grandfather    Diabetes Maternal Grandfather    COPD Maternal Grandfather    Cancer Paternal Grandmother    Non-Hodgkin's lymphoma Paternal Grandmother    Sudden death Paternal Grandmother    Early death Paternal Grandmother    Alcohol abuse Maternal Aunt    Hyperlipidemia Maternal Aunt    Anxiety disorder Sister    Anxiety disorder Son    Asthma Son    Allergies  Allergen Reactions   Ivp Dye [Iodinated Contrast Media] Itching and Nausea Only    Contrast for MRI    Patient Care Team: Eligio Angert, Bonna Gains, NP as PCP - General (Internal Medicine)   Medications: Outpatient Medications Prior to Visit  Medication Sig   acetaminophen (TYLENOL) 325 MG tablet Take 650 mg by mouth every 6 (six) hours as needed for mild pain or moderate pain.   cetirizine (ZYRTEC) 10 MG tablet TAKE 1 TABLET BY MOUTH EVERYDAY AT BEDTIME   ergocalciferol (VITAMIN D2) 1.25 MG (50000 UT) capsule Take 1 capsule (50,000 Units total) by mouth once a week.   levonorgestrel (MIRENA) 20 MCG/24HR IUD 1 each by Intrauterine route once.   metFORMIN (GLUCOPHAGE-XR) 500 MG 24 hr tablet Take 500 mg by mouth daily.   topiramate (TOPAMAX) 50 MG tablet Take 1 tablet (50 mg total) by mouth daily.   No facility-administered medications prior to visit.    Review of Systems  Constitutional:  Negative for activity change, appetite change and unexpected weight change.  Respiratory: Negative.    Cardiovascular: Negative.   Gastrointestinal:  Negative.   Endocrine: Negative for cold intolerance and heat intolerance.  Genitourinary: Negative.   Musculoskeletal: Negative.   Skin: Negative.   Neurological: Negative.   Hematological: Negative.   Psychiatric/Behavioral:  Negative for behavioral problems, decreased concentration, dysphoric mood, hallucinations, self-injury, sleep disturbance and suicidal ideas. The patient is not nervous/anxious.  Objective:  BP 120/84 (BP Location: Left Arm, Patient Position: Sitting, Cuff Size: Large)   Pulse 75   Temp 98.2 F (36.8 C) (Temporal)   Resp 16   Ht  (1.651 m)   Wt 289 lb (131.1 kg)   LMP 08/29/2018 (Approximate)   SpO2 99%   BMI 48.09 kg/m     Physical Exam Vitals and nursing note reviewed.  Constitutional:      General: She is not in acute distress. HENT:     Right Ear: Tympanic membrane, ear canal and external ear normal.     Left Ear: Tympanic membrane, ear canal and external ear normal.     Nose: Nose normal.  Eyes:     Extraocular Movements: Extraocular movements intact.     Conjunctiva/sclera: Conjunctivae normal.     Pupils: Pupils are equal, round, and reactive to light.  Neck:     Thyroid: No thyroid mass, thyromegaly or thyroid tenderness.  Cardiovascular:     Rate and Rhythm: Normal rate and regular rhythm.     Pulses: Normal pulses.     Heart sounds: Normal heart sounds.  Pulmonary:     Effort: Pulmonary effort is normal.     Breath sounds: Normal breath sounds.  Abdominal:     General: Bowel sounds are normal.     Palpations: Abdomen is soft.  Genitourinary:    Comments: Deferred breast and pelvic exam to GYN per patient Musculoskeletal:        General: Normal range of motion.     Cervical back: Normal range of motion and neck supple.     Right lower leg: No edema.     Left lower leg: No edema.  Lymphadenopathy:     Cervical: No cervical adenopathy.  Skin:    General: Skin is warm and dry.  Neurological:     Mental Status: She  is alert and oriented to person, place, and time.     Cranial Nerves: No cranial nerve deficit.  Psychiatric:        Mood and Affect: Mood normal.        Behavior: Behavior normal.        Thought Content: Thought content normal.     No results found for any visits on 09/13/22.    Assessment & Plan:    Routine Health Maintenance and Physical Exam  Immunization History  Administered Date(s) Administered   HPV 9-valent 02/27/2017, 12/30/2021   Influenza Inj Mdck Quad Pf 03/20/2018   Influenza Split 06/04/2012   Influenza,inj,Quad PF,6+ Mos 02/04/2017, 02/27/2017, 01/22/2019, 02/24/2021   Influenza,inj,quad, With Preservative 04/05/2020   Influenza-Unspecified 03/26/2014, 03/27/2016, 03/20/2018, 04/05/2020   Meningococcal Conjugate 12/17/2012   Moderna Sars-Covid-2 Vaccination 10/07/2019   PFIZER(Purple Top)SARS-COV-2 Vaccination 08/31/2019, 04/12/2020   PPD Test 12/14/2012, 10/19/2021   Pfizer Covid-19 Vaccine Bivalent Booster 58yrs & up 02/24/2021   Tdap 12/17/2012, 05/16/2018   Unspecified SARS-COV-2 Vaccination 09/28/2019    Health Maintenance  Topic Date Due   HPV VACCINES (3 - 3-dose series) 03/24/2022   COVID-19 Vaccine (6 - 2023-24 season) 09/29/2022 (Originally 01/28/2022)   INFLUENZA VACCINE  12/29/2022   PAP-Cervical Cytology Screening  01/27/2024   PAP SMEAR-Modifier  01/27/2024   DTaP/Tdap/Td (3 - Td or Tdap) 05/16/2028   Hepatitis C Screening  Completed   HIV Screening  Completed   Discussed health benefits of physical activity, and encouraged her to engage in regular exercise appropriate for her age and condition.  Problem List Items Addressed This Visit  Endocrine   Insulin resistance   Relevant Orders   HgB A1c   Other Visit Diagnoses     Preventative health care    -  Primary   Relevant Orders   HPV 9-valent vaccine,Recombinat   Comprehensive metabolic panel   Immunization due       Relevant Orders   HPV 9-valent vaccine,Recombinat    Encounter for screening for viral disease       Relevant Orders   Cervicovaginal ancillary only( [place])      Return in about 1 year (around 09/13/2023) for CPE (fasting).     Alysia Penna, NP

## 2022-09-14 LAB — CERVICOVAGINAL ANCILLARY ONLY
Bacterial Vaginitis (gardnerella): POSITIVE — AB
Candida Glabrata: NEGATIVE
Candida Vaginitis: NEGATIVE
Chlamydia: NEGATIVE
Comment: NEGATIVE
Comment: NEGATIVE
Comment: NEGATIVE
Comment: NEGATIVE
Comment: NEGATIVE
Comment: NORMAL
Neisseria Gonorrhea: NEGATIVE
Trichomonas: NEGATIVE

## 2022-09-14 MED ORDER — METRONIDAZOLE 0.75 % VA GEL: 1.0000 | Freq: Every day | VAGINAL | 0 refills | Status: DC

## 2022-09-14 NOTE — Addendum Note (Signed)
Addended by: Michaela Corner on: 09/14/2022 03:42 PM   Modules accepted: Orders

## 2022-09-15 DIAGNOSIS — Z124 Encounter for screening for malignant neoplasm of cervix: Secondary | ICD-10-CM | POA: Diagnosis not present

## 2022-09-15 DIAGNOSIS — Z0000 Encounter for general adult medical examination without abnormal findings: Secondary | ICD-10-CM | POA: Diagnosis not present

## 2022-09-15 DIAGNOSIS — Z01419 Encounter for gynecological examination (general) (routine) without abnormal findings: Secondary | ICD-10-CM | POA: Diagnosis not present

## 2022-09-15 DIAGNOSIS — Z113 Encounter for screening for infections with a predominantly sexual mode of transmission: Secondary | ICD-10-CM | POA: Diagnosis not present

## 2022-09-22 ENCOUNTER — Encounter (INDEPENDENT_AMBULATORY_CARE_PROVIDER_SITE_OTHER): Payer: Self-pay | Admitting: Physician Assistant

## 2022-09-22 ENCOUNTER — Ambulatory Visit (INDEPENDENT_AMBULATORY_CARE_PROVIDER_SITE_OTHER): Payer: Medicaid Other | Admitting: Physician Assistant

## 2022-09-22 VITALS — BP 120/78 | HR 55 | Temp 98.1°F | Ht 65.0 in | Wt 284.0 lb

## 2022-09-22 DIAGNOSIS — Z6841 Body Mass Index (BMI) 40.0 and over, adult: Secondary | ICD-10-CM

## 2022-09-22 DIAGNOSIS — R638 Other symptoms and signs concerning food and fluid intake: Secondary | ICD-10-CM

## 2022-09-22 DIAGNOSIS — E88819 Insulin resistance, unspecified: Secondary | ICD-10-CM

## 2022-09-22 DIAGNOSIS — E559 Vitamin D deficiency, unspecified: Secondary | ICD-10-CM | POA: Diagnosis not present

## 2022-09-22 MED ORDER — ERGOCALCIFEROL 1.25 MG (50000 UT) PO CAPS: 50000.0000 [IU] | ORAL_CAPSULE | ORAL | 0 refills | Status: DC

## 2022-09-22 MED ORDER — TOPIRAMATE 50 MG PO TABS: 50.0000 mg | ORAL_TABLET | Freq: Every day | ORAL | 0 refills | Status: DC

## 2022-09-22 NOTE — Progress Notes (Signed)
Office: [phone]  /  Fax: [phone]  WEIGHT SUMMARY AND BIOMETRICS  Vitals Temp: 98.1 F (36.7 C) BP: 120/78 Pulse Rate: (!) 55 SpO2: 95 %   Anthropometric Measurements Height:  (1.651 m) Weight: 284 lb (128.8 kg) BMI (Calculated): 47.26 Weight at Last Visit: 289lb Weight Lost Since Last Visit: 0 Weight Gained Since Last Visit: 0 Starting Weight: 267lb Total Weight Loss (lbs): 0 lb (0 kg)   Body Composition  Body Fat %: 51.1 % Fat Mass (lbs): 145.2 lbs Muscle Mass (lbs): 131.8 lbs Total Body Water (lbs): 107 lbs Visceral Fat Rating : 15   Other Clinical Data Fasting: no Labs: no Today's Visit #: 9 Starting Date: 04/05/22     HPI  Chief Complaint: OBESITY  Stacy Mills is here to discuss her progress with her obesity treatment plan. She is on the keeping a food journal and adhering to recommended goals of 1500 calories and 100 protein and states she is following her eating plan approximately 85 % of the time. She states she is exercising wlking 15 minutes 4 times per week.   Interval History:  Since last office visit she has maintained.  Hunger/appetite- moderate control  Cravings- for sweets/cakes and does not always remember to take topamax Stress- manageable Exercise- Walking 3-4 days weekly.  Journaling with My Fitness Pal- most recent week- calories 760/protein 39 grams daily ave.  Endorses skipping meals at time and then eating very little protein at meals. She has started to supplement with Premier protein shakes, but not consistently. We discussed the importance of regular meals and adequate protein intake for weight loss and overall health.   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  28 yr old son and busy with T ball.  Has friend's wedding coming up this weekend.   Pharmacotherapy: Topamax for cravings. IUD for birth control. Reports  not consistently taking, but does feel it helps some with cravings when remember to take.  Metformin for insulin resistance. No GI side effects with metformin.   PHYSICAL EXAM:  Blood pressure 120/78, pulse (!) 55, temperature 98.1 F (36.7 C), height  (1.651 m), weight 284 lb (128.8 kg), last menstrual period 08/29/2018, SpO2 95 %. Body mass index is 47.26 kg/m.  General: She is overweight, cooperative, alert, well developed, and in no acute distress. PSYCH: Has normal mood, affect and thought process.   Cardiovascular ; HR 60 regular Lungs: Normal breathing effort, no conversational dyspnea. Neuro; no focal deficit  DIAGNOSTIC DATA REVIEWED:  BMET    Component Value Date/Time   NA 139 09/13/2022 0936   NA 140 04/05/2022 0839   K 3.9 09/13/2022 0936   CL 106 09/13/2022 0936   CO2 26 09/13/2022 0936   GLUCOSE 80 09/13/2022 0936   BUN 16 09/13/2022 0936   BUN 10 04/05/2022 0839   CREATININE 0.86 09/13/2022 0936   CREATININE 0.98 09/23/2021 1440   CREATININE 0.93 11/05/2012 1015   CALCIUM 9.2 09/13/2022 0936   GFRNONAA >60 09/23/2021 1440   GFRAA >60 05/31/2019 0417   Lab Results  Component Value Date   HGBA1C 5.2 09/13/2022   HGBA1C 5.4 05/09/2016   Lab Results  Component Value Date   INSULIN 15.5 04/05/2022   Lab Results  Component Value Date   TSH 1.250 04/05/2022   CBC    Component Value Date/Time   WBC 8.7 04/05/2022 0839  WBC 7.2 09/23/2021 1440   WBC 8.1 09/16/2021 0847   RBC 4.65 04/05/2022 0839   RBC 4.01 09/23/2021 1440   HGB 13.8 04/05/2022 0839   HCT 42.1 04/05/2022 0839   PLT 333 04/05/2022 0839   MCV 91 04/05/2022 0839   MCH 29.7 04/05/2022 0839   MCH 29.9 09/23/2021 1440   MCHC 32.8 04/05/2022 0839   MCHC 33.1 09/23/2021 1440   RDW 11.6 (L) 04/05/2022 0839   Iron Studies No results found for: "IRON", "TIBC", "FERRITIN", "IRONPCTSAT" Lipid Panel     Component Value Date/Time   CHOL 164 04/05/2022 0839   TRIG 57 04/05/2022 0839    HDL 61 04/05/2022 0839   CHOLHDL 3 01/26/2021 1421   VLDL 9.0 01/26/2021 1421   LDLCALC 92 04/05/2022 0839   Hepatic Function Panel     Component Value Date/Time   PROT 7.0 09/13/2022 0936   PROT 7.2 04/05/2022 0839   ALBUMIN 4.1 09/13/2022 0936   ALBUMIN 4.4 04/05/2022 0839   AST 14 09/13/2022 0936   AST 15 09/23/2021 1440   ALT 13 09/13/2022 0936   ALT 12 09/23/2021 1440   ALKPHOS 53 09/13/2022 0936   BILITOT 0.3 09/13/2022 0936   BILITOT <0.2 04/05/2022 0839   BILITOT 0.4 09/23/2021 1440      Component Value Date/Time   TSH 1.250 04/05/2022 0839   Nutritional Lab Results  Component Value Date   VD25OH 21.7 (L) 07/28/2022   VD25OH 11.1 (L) 04/05/2022    ASSOCIATED CONDITIONS ADDRESSED TODAY  ASSESSMENT AND PLAN  Problem List Items Addressed This Visit     Morbid obesity   Vitamin D deficiency    Vitamin D Deficiency Vitamin D is not at goal of 50.  Most recent vitamin D level was 21.7. She is on  prescription ergocalciferol 50,000 IU weekly. Lab Results  Component Value Date   VD25OH 21.7 (L) 07/28/2022   VD25OH 11.1 (L) 04/05/2022   Plan: Continue and refill  prescription ergocalciferol 50,000 IU weekly Low vitamin D levels can be associated with adiposity and may result in leptin resistance and weight gain. Also associated with fatigue. Currently on vitamin D supplementation without any adverse effects.         Relevant Medications   ergocalciferol (VITAMIN D2) 1.25 MG (50000 UT) capsule   Insulin resistance - Primary    Insulin Resistance Last fasting insulin was 15.5. A1c was 5.2. Polyphagia:Yes Medication(s): Metformin 500 mg once daily breakfast and Topiramate 50 mg daily. No side effects with medications.  She is working on Engineer, technical sales to decrease simple carbohydrates, increase lean proteins and exercise to promote weight loss, improve glycemic control and prevent progression to Type 2 diabetes.   Lab Results  Component Value Date    HGBA1C 5.2 09/13/2022   HGBA1C 5.2 04/05/2022   HGBA1C 5.4 07/27/2021   HGBA1C 5.2 01/26/2021   HGBA1C 5.4 05/09/2016   Lab Results  Component Value Date   INSULIN 15.5 04/05/2022   Plan: Continue and refill Metformin 500 mg once daily breakfast and Topiramate 50 mg daily. Continue working on nutrition plan to decrease simple carbohydrates, increase lean proteins and exercise to promote weight loss, improve glycemic control and prevent progression to Type 2 diabetes.  Increase protein intake. Increase fiber intake.        Abnormal craving    Eating disorder/emotional eating Kenyanna has had issues with stress/emotional eating. Currently this is moderately controlled. Overall mood is stable. Medication(s): Topiramate 50 mg daily. No side effects .  Plan: Continue and refill Topiramate 50 mg daily.  Consider referral to Dr. Dewaine Conger at next in office visit. Discussed strategies for emotional eating behaviors.           Relevant Medications   topiramate (TOPAMAX) 50 MG tablet   TREATMENT PLAN FOR OBESITY:  Recommended Dietary Goals  Sawsan is currently in the action stage of change. As such, her goal is to continue weight management plan. She has agreed to keeping a food journal and adhering to recommended goals of 1500 calories and 85 grams protein.  Behavioral Intervention  We discussed the following Behavioral Modification Strategies today: increasing lean protein intake, decreasing simple carbohydrates , increasing vegetables, avoiding skipping meals, increasing water intake, work on meal planning and preparation, decreasing eating out or consumption of processed foods, and making healthy choices when eating convenient foods, continue to practice mindfulness when eating, and planning for success.  Additional resources provided today: NA  Recommended Physical Activity Goals  Marco has been advised to work up to 150 minutes of moderate intensity aerobic activity a week  and strengthening exercises 2-3 times per week for cardiovascular health, weight loss maintenance and preservation of muscle mass.   She has agreed to Continue current level of physical activity    Pharmacotherapy We discussed various medication options to help Corinne with her weight loss efforts and we both agreed to continue metformin for insulin resistance, topamax for cravings and continue to work on nutritional and behavioral strategies to promote weight loss.   She may benefit from RD consultation as well.     Return in about 2 weeks (around 10/06/2022).Marland Kitchen She was informed of the importance of frequent follow up visits to maximize her success with intensive lifestyle modifications for her multiple health conditions.   ATTESTASTION STATEMENTS:  Reviewed by clinician on day of visit: allergies, medications, problem list, medical history, surgical history, family history, social history, and previous encounter notes.   I have personally spent 40 minutes total time today in preparation, patient care, nutritional counseling and documentation for this visit, including the following: review of clinical lab tests; review of medical tests/procedures/services.      Nole Robey, PA-C

## 2022-09-22 NOTE — Assessment & Plan Note (Signed)
Eating disorder/emotional eating Stacy Mills has had issues with stress/emotional eating. Currently this is moderately controlled. Overall mood is stable. Medication(s): Topiramate 50 mg daily. No side effects .   Plan: Continue and refill Topiramate 50 mg daily.  Consider referral to Dr. Dewaine Conger at next in office visit. Discussed strategies for emotional eating behaviors.

## 2022-09-22 NOTE — Assessment & Plan Note (Signed)
Insulin Resistance Last fasting insulin was 15.5. A1c was 5.2. Polyphagia:Yes Medication(s): Metformin 500 mg once daily breakfast and Topiramate 50 mg daily. No side effects with medications.  She is working on Engineer, technical sales to decrease simple carbohydrates, increase lean proteins and exercise to promote weight loss, improve glycemic control and prevent progression to Type 2 diabetes.   Lab Results  Component Value Date   HGBA1C 5.2 09/13/2022   HGBA1C 5.2 04/05/2022   HGBA1C 5.4 07/27/2021   HGBA1C 5.2 01/26/2021   HGBA1C 5.4 05/09/2016   Lab Results  Component Value Date   INSULIN 15.5 04/05/2022    Plan: Continue and refill Metformin 500 mg once daily breakfast and Topiramate 50 mg daily. Continue working on nutrition plan to decrease simple carbohydrates, increase lean proteins and exercise to promote weight loss, improve glycemic control and prevent progression to Type 2 diabetes.  Increase protein intake. Increase fiber intake.

## 2022-09-22 NOTE — Assessment & Plan Note (Signed)
Vitamin D Deficiency Vitamin D is not at goal of 50.  Most recent vitamin D level was 21.7. She is on  prescription ergocalciferol 50,000 IU weekly. Lab Results  Component Value Date   VD25OH 21.7 (L) 07/28/2022   VD25OH 11.1 (L) 04/05/2022    Plan: Continue and refill  prescription ergocalciferol 50,000 IU weekly Low vitamin D levels can be associated with adiposity and may result in leptin resistance and weight gain. Also associated with fatigue. Currently on vitamin D supplementation without any adverse effects.

## 2022-10-05 ENCOUNTER — Ambulatory Visit (INDEPENDENT_AMBULATORY_CARE_PROVIDER_SITE_OTHER): Payer: Medicaid Other | Admitting: Physician Assistant

## 2022-10-06 DIAGNOSIS — Z124 Encounter for screening for malignant neoplasm of cervix: Secondary | ICD-10-CM | POA: Diagnosis not present

## 2022-10-06 DIAGNOSIS — L02224 Furuncle of groin: Secondary | ICD-10-CM | POA: Diagnosis not present

## 2022-10-13 ENCOUNTER — Encounter (INDEPENDENT_AMBULATORY_CARE_PROVIDER_SITE_OTHER): Payer: Self-pay | Admitting: Internal Medicine

## 2022-10-13 ENCOUNTER — Ambulatory Visit (INDEPENDENT_AMBULATORY_CARE_PROVIDER_SITE_OTHER): Payer: Medicaid Other | Admitting: Internal Medicine

## 2022-10-13 VITALS — BP 108/74 | HR 84 | Temp 98.0°F | Ht 65.0 in | Wt 281.0 lb

## 2022-10-13 DIAGNOSIS — R638 Other symptoms and signs concerning food and fluid intake: Secondary | ICD-10-CM

## 2022-10-13 DIAGNOSIS — E88819 Insulin resistance, unspecified: Secondary | ICD-10-CM | POA: Diagnosis not present

## 2022-10-13 DIAGNOSIS — Z6841 Body Mass Index (BMI) 40.0 and over, adult: Secondary | ICD-10-CM | POA: Diagnosis not present

## 2022-10-13 DIAGNOSIS — E66813 Obesity, class 3: Secondary | ICD-10-CM

## 2022-10-13 DIAGNOSIS — E669 Obesity, unspecified: Secondary | ICD-10-CM

## 2022-10-13 MED ORDER — TOPIRAMATE 50 MG PO TABS: 50.0000 mg | ORAL_TABLET | Freq: Every day | ORAL | 0 refills | Status: DC

## 2022-10-13 NOTE — Progress Notes (Signed)
Office: [phone]  /  Fax: [phone]  WEIGHT SUMMARY AND BIOMETRICS  Vitals Temp: 98 F (36.7 C) BP: 108/74 Pulse Rate: 84 SpO2: 98 %   Anthropometric Measurements Height: 5\' 5"  (1.651 m) Weight: 281 lb (127.5 kg) BMI (Calculated): 46.76 Weight at Last Visit: 284 lb Weight Lost Since Last Visit: 3 lb Weight Gained Since Last Visit: 0 Starting Weight: 267 lb Peak Weight: 280 lb   Body Composition  Body Fat %: 46.2 % Fat Mass (lbs): 130 lbs Muscle Mass (lbs): 44 lbs Total Body Water (lbs): 110.2 lbs Visceral Fat Rating : 13    No data recorded Today's Visit #: 10  Starting Date: 04/05/22   HPI  Chief Complaint: OBESITY  Stacy Mills is here to discuss her progress with her obesity treatment plan. She is on the keeping a food journal and adhering to recommended goals of 1500 calories and 116 protein and states she is following her eating plan approximately 90 % of the time. She states she is currently not exercising.   Interval History:  Since last office visit she has lost 3 lbs.  She reports good adherence to reduced calorie nutritional plan. She has been working on increasing protein intake at every meal, avoiding and or reducing liquid calories, making healthier choices, and decreased sweets . Also less snacking. Using protein shakes premier 1 a day Denies problems with appetite and hunger signals.  Denies problems with satiety and satiation.  Denies problems with eating patterns and portion control.  Denies abnormal cravings. Denies feeling deprived or restricted.   Barriers identified: cost of healthy foods, having difficulty focusing on healthy eating, cost of medication, and exposure to enticing environments and or relationships.   Pharmacotherapy for weight loss: She is currently taking Topiramate (off label use, single agent).    ASSESSMENT AND PLAN  TREATMENT PLAN FOR OBESITY:  Recommended Dietary Goals  Stacy Mills is currently in the action  stage of change. As such, her goal is to continue weight management plan. She has agreed to: continue current plan  Behavioral Intervention  We discussed the following Behavioral Modification Strategies today: increasing lean protein intake, decreasing simple carbohydrates , increasing vegetables, increasing lower glycemic fruits, increasing fiber rich foods, avoiding skipping meals, increasing water intake, and planning for success.   Additional resources provided today: None  Recommended Physical Activity Goals  Stacy Mills has been advised to work up to 150 minutes of moderate intensity aerobic activity a week and strengthening exercises 2-3 times per week for cardiovascular health, weight loss maintenance and preservation of muscle mass.   She has agreed to :  Think about ways to increase daily physical activity and overcoming barriers to exercise  Pharmacotherapy We discussed various medication options to help Stacy Mills with her weight loss efforts and we both agreed to : continue current anti-obesity medication regimen  ASSOCIATED CONDITIONS ADDRESSED TODAY  Obesity, currrent BMI 46  Abnormal craving Assessment & Plan: Neurohormonal.  Improved on topiramate 50 mg once a day with only minor paresthesias involving her hands.  She has an IUD.  Continue current dose.  Check CMP at the next office visit  Orders: -     Topiramate; Take 1 tablet (50 mg total) by mouth daily.  Dispense: 30 tablet; Refill: 0  Insulin resistance Assessment & Plan: Patient did not tolerate treatment with metformin.  We discussed the importance of reducing simple and added sugars in her diet as well as increasing complex carbs and protein to reduce insulin resistance.  She  has been working hard on reducing processed sugars and I congratulated her on this.  We also reviewed the benefits of physical activity in particular strengthening.     PHYSICAL EXAM:  Blood pressure 108/74, pulse 84, temperature 98 F  (36.7 C), height 5\' 5"  (1.651 m), weight 281 lb (127.5 kg), last menstrual period 08/29/2018, SpO2 98 %. Body mass index is 46.76 kg/m.  General: She is overweight, cooperative, alert, well developed, and in no acute distress. PSYCH: Has normal mood, affect and thought process.   HEENT: EOMI, sclerae are anicteric. Lungs: Normal breathing effort, no conversational dyspnea. Extremities: No edema.  Neurologic: No gross sensory or motor deficits. No tremors or fasciculations noted.    DIAGNOSTIC DATA REVIEWED:  BMET    Component Value Date/Time   NA 139 09/13/2022 0936   NA 140 04/05/2022 0839   K 3.9 09/13/2022 0936   CL 106 09/13/2022 0936   CO2 26 09/13/2022 0936   GLUCOSE 80 09/13/2022 0936   BUN 16 09/13/2022 0936   BUN 10 04/05/2022 0839   CREATININE 0.86 09/13/2022 0936   CREATININE 0.98 09/23/2021 1440   CREATININE 0.93 11/05/2012 1015   CALCIUM 9.2 09/13/2022 0936   GFRNONAA >60 09/23/2021 1440   GFRAA >60 05/31/2019 0417   Lab Results  Component Value Date   HGBA1C 5.2 09/13/2022   HGBA1C 5.4 05/09/2016   Lab Results  Component Value Date   INSULIN 15.5 04/05/2022   Lab Results  Component Value Date   TSH 1.250 04/05/2022   CBC    Component Value Date/Time   WBC 8.7 04/05/2022 0839   WBC 7.2 09/23/2021 1440   WBC 8.1 09/16/2021 0847   RBC 4.65 04/05/2022 0839   RBC 4.01 09/23/2021 1440   HGB 13.8 04/05/2022 0839   HCT 42.1 04/05/2022 0839   PLT 333 04/05/2022 0839   MCV 91 04/05/2022 0839   MCH 29.7 04/05/2022 0839   MCH 29.9 09/23/2021 1440   MCHC 32.8 04/05/2022 0839   MCHC 33.1 09/23/2021 1440   RDW 11.6 (L) 04/05/2022 0839   Iron Studies No results found for: "IRON", "TIBC", "FERRITIN", "IRONPCTSAT" Lipid Panel     Component Value Date/Time   CHOL 164 04/05/2022 0839   TRIG 57 04/05/2022 0839   HDL 61 04/05/2022 0839   CHOLHDL 3 01/26/2021 1421   VLDL 9.0 01/26/2021 1421   LDLCALC 92 04/05/2022 0839   Hepatic Function Panel      Component Value Date/Time   PROT 7.0 09/13/2022 0936   PROT 7.2 04/05/2022 0839   ALBUMIN 4.1 09/13/2022 0936   ALBUMIN 4.4 04/05/2022 0839   AST 14 09/13/2022 0936   AST 15 09/23/2021 1440   ALT 13 09/13/2022 0936   ALT 12 09/23/2021 1440   ALKPHOS 53 09/13/2022 0936   BILITOT 0.3 09/13/2022 0936   BILITOT <0.2 04/05/2022 0839   BILITOT 0.4 09/23/2021 1440      Component Value Date/Time   TSH 1.250 04/05/2022 0839   Nutritional Lab Results  Component Value Date   VD25OH 21.7 (L) 07/28/2022   VD25OH 11.1 (L) 04/05/2022     Return in about 3 weeks (around 11/03/2022) for For Weight Mangement with Dr. Rikki Spearing.Marland Kitchen She was informed of the importance of frequent follow up visits to maximize her success with intensive lifestyle modifications for her multiple health conditions.   ATTESTASTION STATEMENTS:  Reviewed by clinician on day of visit: allergies, medications, problem list, medical history, surgical history, family history, social history, and previous  encounter notes.     Worthy Rancher, MD

## 2022-10-13 NOTE — Assessment & Plan Note (Signed)
Patient did not tolerate treatment with metformin.  We discussed the importance of reducing simple and added sugars in her diet as well as increasing complex carbs and protein to reduce insulin resistance.  She has been working hard on reducing processed sugars and I congratulated her on this.  We also reviewed the benefits of physical activity in particular strengthening.

## 2022-10-13 NOTE — Assessment & Plan Note (Addendum)
Neurohormonal.  Improved on topiramate 50 mg once a day with only minor paresthesias involving her hands.  She has an IUD.  Continue current dose.  Check CMP at the next office visit

## 2022-10-16 LAB — HM PAP SMEAR: HM Pap smear: NEGATIVE

## 2022-11-16 ENCOUNTER — Encounter (INDEPENDENT_AMBULATORY_CARE_PROVIDER_SITE_OTHER): Payer: Self-pay | Admitting: Internal Medicine

## 2022-11-16 ENCOUNTER — Ambulatory Visit (INDEPENDENT_AMBULATORY_CARE_PROVIDER_SITE_OTHER): Payer: Medicaid Other | Admitting: Internal Medicine

## 2022-11-16 VITALS — BP 118/84 | HR 82 | Temp 97.7°F | Ht 65.0 in | Wt 280.0 lb

## 2022-11-16 DIAGNOSIS — E669 Obesity, unspecified: Secondary | ICD-10-CM | POA: Diagnosis not present

## 2022-11-16 DIAGNOSIS — E88819 Insulin resistance, unspecified: Secondary | ICD-10-CM | POA: Diagnosis not present

## 2022-11-16 DIAGNOSIS — R638 Other symptoms and signs concerning food and fluid intake: Secondary | ICD-10-CM | POA: Diagnosis not present

## 2022-11-16 DIAGNOSIS — Z6841 Body Mass Index (BMI) 40.0 and over, adult: Secondary | ICD-10-CM

## 2022-11-16 NOTE — Assessment & Plan Note (Signed)
Neurohormonal, unchanged.  Topiramate discontinued due to paresthesias.  She had no clinical benefit from metformin.  Consider sympathomimetic next if no contraindications exist.

## 2022-11-16 NOTE — Assessment & Plan Note (Signed)
Patient did not tolerate treatment with metformin.  We discussed the importance of reducing simple and added sugars in her diet as well as increasing complex carbs and protein to reduce insulin resistance.  She has been working hard on reducing processed sugars and I congratulated her on this.  We also reviewed the benefits of physical activity in particular strengthening. 

## 2022-11-16 NOTE — Assessment & Plan Note (Signed)
Very challenging situation as she is somewhat contemplative and not in the action phase.  She has a predilection for prepackaged foods and has limited food variability.  She also does not track and journal.  Consider referral to registered dietitian for more tailored nutritional approach.

## 2022-11-16 NOTE — Progress Notes (Signed)
Office: [phone]  /  Fax: [phone]  WEIGHT SUMMARY AND BIOMETRICS  Vitals Temp: 97.7 F (36.5 C) BP: 118/84 Pulse Rate: 82 SpO2: 100 %   Anthropometric Measurements Height: 5\' 5"  (1.651 m) Weight: 280 lb (127 kg) BMI (Calculated): 46.59 Weight at Last Visit: 281 lb Weight Lost Since Last Visit: 1 lb Starting Weight: 267 lb Total Weight Loss (lbs): 0 lb (0 kg) Peak Weight: 280 lb   Body Composition  Body Fat %: 48.1 % Fat Mass (lbs): 134.8 lbs Muscle Mass (lbs): 138.2 lbs Total Body Water (lbs): 101.4 lbs Visceral Fat Rating : 14    No data recorded Today's Visit #: 11  Starting Date: 06/05/21   HPI  Chief Complaint: OBESITY  Stacy Mills is here to discuss her progress with her obesity treatment plan. She is on the keeping a food journal and adhering to recommended goals of 1500 calories and 116 protein and states she is following her eating plan approximately 75-80 % of the time. She states she is exercising 35 minutes 4 times per week.  Interval History:  Since last office visit she has lost 1 lb. Stopped topiramate due to paresthesias in hands. She reports variable adherence to reduced calorie nutritional plan.  She has not been tracking or journaling.  I have asked her to do this a few times.  She denies having any difficulties reading food labels. 24 hour recall: Breakfast: 2 eggs, PB toast (white bread), Lunch: Chicken pot pie prepackaged, Dinner: Hamburger helper pasta, regular ground beef. Protein shake for snacks. No vegetables or fruits. Fruits and vegetables in a week - about 4 servings a week.  Orixegenic Control: Denies problems with appetite and hunger signals.  Denies problems with satiety and satiation.  Denies problems with eating patterns and portion control.  Reports abnormal cravings. Denies feeling deprived or restricted.   Barriers identified: strong hunger signals and appetite, having difficulty preparing healthy meals, having  difficulty with meal prep and planning, having difficulty focusing on healthy eating, exposure to enticing environments and or relationships, and predilection for convenience or prepackaged foods.   Pharmacotherapy for weight loss: She is currently taking Topiramate (off label use, single agent) without clinical response and experiencing the following side effects: Paresthesias medication discontinued..  She also had been on metformin in the past but experienced increased hunger.   ASSESSMENT AND PLAN  TREATMENT PLAN FOR OBESITY:  Recommended Dietary Goals  Retaj is currently in the action stage of change. As such, her goal is to continue weight management plan. She has agreed to: portion control, balanced plate and making smarter food choices, such as increasing vegetables, protein intake and reducing simple carbohydrates and processed foods  and keep a food journal with a target of  1500 calories and 100 grams of protein   Behavioral Intervention  We discussed the following Behavioral Modification Strategies today: increasing lean protein intake, decreasing simple carbohydrates , increasing vegetables, increasing lower glycemic fruits, increasing fiber rich foods, increasing water intake, work on meal planning and preparation, work on tracking and journaling calories using tracking application, reading food labels , keeping healthy foods at home, planning for success, and better snacking choices.  Additional resources provided today: None  Recommended Physical Activity Goals  Misbah has been advised to work up to 150 minutes of moderate intensity aerobic activity a week and strengthening exercises 2-3 times per week for cardiovascular health, weight loss maintenance and preservation of muscle mass.   She has agreed to :  Think  about ways to increase daily physical activity and overcoming barriers to exercise and Increase physical activity in their day and reduce sedentary time (increase  NEAT).  Pharmacotherapy We discussed various medication options to help Maylene with her weight loss efforts and we both agreed to :  Discontinue topiramate due to medication side effects.  Insurance does not cover incretin therapy  ASSOCIATED CONDITIONS ADDRESSED TODAY  Obesity, currrent BMI 46 Assessment & Plan: Very challenging situation as she is somewhat contemplative and not in the action phase.  She has a predilection for prepackaged foods and has limited food variability.  She also does not track and journal.  Consider referral to registered dietitian for more tailored nutritional approach.   Abnormal craving Assessment & Plan: Neurohormonal, unchanged.  Topiramate discontinued due to paresthesias.  She had no clinical benefit from metformin.  Consider sympathomimetic next if no contraindications exist.   Insulin resistance Assessment & Plan: Patient did not tolerate treatment with metformin.  We discussed the importance of reducing simple and added sugars in her diet as well as increasing complex carbs and protein to reduce insulin resistance.  She has been working hard on reducing processed sugars and I congratulated her on this.  We also reviewed the benefits of physical activity in particular strengthening.     PHYSICAL EXAM:  Blood pressure 118/84, pulse 82, temperature 97.7 F (36.5 C), height 5\' 5"  (1.651 m), weight 280 lb (127 kg), SpO2 100 %. Body mass index is 46.59 kg/m.  General: She is overweight, cooperative, alert, well developed, and in no acute distress. PSYCH: Has normal mood, affect and thought process.   HEENT: EOMI, sclerae are anicteric. Lungs: Normal breathing effort, no conversational dyspnea. Extremities: No edema.  Neurologic: No gross sensory or motor deficits. No tremors or fasciculations noted.    DIAGNOSTIC DATA REVIEWED:  BMET    Component Value Date/Time   NA 139 09/13/2022 0936   NA 140 04/05/2022 0839   K 3.9 09/13/2022 0936   CL  106 09/13/2022 0936   CO2 26 09/13/2022 0936   GLUCOSE 80 09/13/2022 0936   BUN 16 09/13/2022 0936   BUN 10 04/05/2022 0839   CREATININE 0.86 09/13/2022 0936   CREATININE 0.98 09/23/2021 1440   CREATININE 0.93 11/05/2012 1015   CALCIUM 9.2 09/13/2022 0936   GFRNONAA >60 09/23/2021 1440   GFRAA >60 05/31/2019 0417   Lab Results  Component Value Date   HGBA1C 5.2 09/13/2022   HGBA1C 5.4 05/09/2016   Lab Results  Component Value Date   INSULIN 15.5 04/05/2022   Lab Results  Component Value Date   TSH 1.250 04/05/2022   CBC    Component Value Date/Time   WBC 8.7 04/05/2022 0839   WBC 7.2 09/23/2021 1440   WBC 8.1 09/16/2021 0847   RBC 4.65 04/05/2022 0839   RBC 4.01 09/23/2021 1440   HGB 13.8 04/05/2022 0839   HCT 42.1 04/05/2022 0839   PLT 333 04/05/2022 0839   MCV 91 04/05/2022 0839   MCH 29.7 04/05/2022 0839   MCH 29.9 09/23/2021 1440   MCHC 32.8 04/05/2022 0839   MCHC 33.1 09/23/2021 1440   RDW 11.6 (L) 04/05/2022 0839   Iron Studies No results found for: "IRON", "TIBC", "FERRITIN", "IRONPCTSAT" Lipid Panel     Component Value Date/Time   CHOL 164 04/05/2022 0839   TRIG 57 04/05/2022 0839   HDL 61 04/05/2022 0839   CHOLHDL 3 01/26/2021 1421   VLDL 9.0 01/26/2021 1421   LDLCALC 92  04/05/2022 0839   Hepatic Function Panel     Component Value Date/Time   PROT 7.0 09/13/2022 0936   PROT 7.2 04/05/2022 0839   ALBUMIN 4.1 09/13/2022 0936   ALBUMIN 4.4 04/05/2022 0839   AST 14 09/13/2022 0936   AST 15 09/23/2021 1440   ALT 13 09/13/2022 0936   ALT 12 09/23/2021 1440   ALKPHOS 53 09/13/2022 0936   BILITOT 0.3 09/13/2022 0936   BILITOT <0.2 04/05/2022 0839   BILITOT 0.4 09/23/2021 1440      Component Value Date/Time   TSH 1.250 04/05/2022 0839   Nutritional Lab Results  Component Value Date   VD25OH 21.7 (L) 07/28/2022   VD25OH 11.1 (L) 04/05/2022     Return in about 3 weeks (around 12/07/2022) for For Weight Mangement with Dr. Rikki Spearing - 40  minutes.. She was informed of the importance of frequent follow up visits to maximize her success with intensive lifestyle modifications for her multiple health conditions.   ATTESTASTION STATEMENTS:  Reviewed by clinician on day of visit: allergies, medications, problem list, medical history, surgical history, family history, social history, and previous encounter notes.     Worthy Rancher, MD

## 2022-12-14 ENCOUNTER — Ambulatory Visit (INDEPENDENT_AMBULATORY_CARE_PROVIDER_SITE_OTHER): Payer: Medicaid Other | Admitting: Internal Medicine

## 2022-12-14 VITALS — BP 129/82 | HR 76 | Temp 98.2°F | Ht 65.0 in | Wt 286.0 lb

## 2022-12-14 DIAGNOSIS — E66813 Obesity, class 3: Secondary | ICD-10-CM

## 2022-12-14 DIAGNOSIS — Z6841 Body Mass Index (BMI) 40.0 and over, adult: Secondary | ICD-10-CM | POA: Diagnosis not present

## 2022-12-14 DIAGNOSIS — Z30432 Encounter for removal of intrauterine contraceptive device: Secondary | ICD-10-CM | POA: Diagnosis not present

## 2022-12-14 DIAGNOSIS — E88819 Insulin resistance, unspecified: Secondary | ICD-10-CM | POA: Diagnosis not present

## 2022-12-14 NOTE — Assessment & Plan Note (Signed)
See treatment plan for obesity.  Patient has had a difficult time implementing or reduced calorie nutrition plan.  She benefits from a more tailored approach and will be referred to registered dietitian.  We will hold off trying other antiobesity medications as nutritional approach has been suboptimal.  She was also provided with a 1500-calorie 7 day plan.

## 2022-12-14 NOTE — Assessment & Plan Note (Signed)
Patient did not tolerate treatment with metformin.  We discussed the importance of reducing simple and added sugars in her diet continues to have difficulty with this.  Patient will be referred to dietitian.

## 2022-12-14 NOTE — Progress Notes (Signed)
Office: [phone]  /  Fax: [phone]  WEIGHT SUMMARY AND BIOMETRICS  Vitals Temp: 98.2 F (36.8 C) BP: 129/82 Pulse Rate: 76 SpO2: 99 %   Anthropometric Measurements Height: 5\' 5"  (1.651 m) Weight: 286 lb (129.7 kg) BMI (Calculated): 47.59 Weight at Last Visit: 280 lb Weight Lost Since Last Visit: 0 Weight Gained Since Last Visit: 6 lb Starting Weight: 267 lb Total Weight Loss (lbs): 0 lb (0 kg) Peak Weight: 280 lb   Body Composition  Body Fat %: 51 % Fat Mass (lbs): 146 lbs Muscle Mass (lbs): 133.2 lbs Total Body Water (lbs): 106.6 lbs Visceral Fat Rating : 15    No data recorded Today's Visit #: 12  Starting Date: 04/05/22   HPI  Chief Complaint: OBESITY  Stacy Mills is here to discuss her progress with her obesity treatment plan. She is on the keeping a food journal and adhering to recommended goals of 1500 calories and 100 protein and practicing portion control and making smarter food choices, such as increasing vegetables and decreasing simple carbohydrates and states she is following her eating plan approximately 75 % of the time. She states she is exercising walking 20 minutes 2 times per week.  Interval History:  Since last office visit she has gained 5 lbs. She acknowledges this increasing the consumption of carbs.  She is requesting a 7-day eating plan which we do not have available.  She is wanting to try new foods but needs direction.  She has limited food variety has had difficulty implementing a reduced calorie nutrition plan.  She notes increased cravings for carbs.   Orixegenic Control: Denies problems with appetite and hunger signals.  Denies problems with satiety and satiation.  Reports problems with eating patterns and portion control.  Reports abnormal cravings. Denies feeling deprived or restricted.   Barriers identified: having difficulty preparing healthy meals, having difficulty with meal prep and planning, having difficulty  focusing on healthy eating, exposure to enticing environments and/or relationships, and difficulty implementing reduced calorie nutrition plan.   Pharmacotherapy for weight loss: She is currently taking no anti-obesity medication.  She did not tolerate metformin or topiramate.   ASSESSMENT AND PLAN  TREATMENT PLAN FOR OBESITY:  Recommended Dietary Goals  Kiriana is currently in the action stage of change. As such, her goal is to continue weight management plan. She has agreed to: Patient was provided with a 7-day eating plan from [place].  I am referring her to a dietitian for a more tailored approach.  She is looking for variety but also has limited food variety so we need to expand on this.  Behavioral Intervention  We discussed the following Behavioral Modification Strategies today: increasing lean protein intake, decreasing simple carbohydrates , increasing vegetables, increasing lower glycemic fruits, increasing fiber rich foods, increasing water intake, continue to practice mindfulness when eating, and planning for success.  Additional resources provided today:  We demonstrated the use of AI for meal planning purposes.  Recommended Physical Activity Goals  Saher has been advised to work up to 150 minutes of moderate intensity aerobic activity a week and strengthening exercises 2-3 times per week for cardiovascular health, weight loss maintenance and preservation of muscle mass.   She has agreed to :  Think about ways to increase daily physical activity and overcoming barriers to exercise  Pharmacotherapy We discussed various medication options to help Zsazsa with her weight loss efforts and we both agreed to : continue with nutritional and behavioral strategies  ASSOCIATED CONDITIONS ADDRESSED  TODAY  Class 3 severe obesity with serious comorbidity and body mass index (BMI) of 40.0 to 44.9 in adult, unspecified obesity type [facility]) Assessment & Plan: See treatment plan for  obesity.  Patient has had a difficult time implementing or reduced calorie nutrition plan.  She benefits from a more tailored approach and will be referred to registered dietitian.  We will hold off trying other antiobesity medications as nutritional approach has been suboptimal.  She was also provided with a 1500-calorie 7 day plan.   Orders: -     Amb ref to Medical Nutrition Therapy-MNT  Insulin resistance Assessment & Plan: Patient did not tolerate treatment with metformin.  We discussed the importance of reducing simple and added sugars in her diet continues to have difficulty with this.  Patient will be referred to dietitian.  Orders: -     Amb ref to Medical Nutrition Therapy-[facility]    PHYSICAL EXAM:  Blood pressure 129/82, pulse 76, temperature 98.2 F (36.8 C), height 5\' 5"  (1.651 m), weight 286 lb (129.7 kg), SpO2 99%. Body mass index is 47.59 kg/m.  General: She is overweight, cooperative, alert, well developed, and in no acute distress. PSYCH: Has normal mood, affect and thought process.   HEENT: EOMI, sclerae are anicteric. Lungs: Normal breathing effort, no conversational dyspnea. Extremities: No edema.  Neurologic: No gross sensory or motor deficits. No tremors or fasciculations noted.    DIAGNOSTIC DATA REVIEWED:  BMET    Component Value Date/Time   NA 139 09/13/2022 0936   NA 140 04/05/2022 0839   K 3.9 09/13/2022 0936   CL 106 09/13/2022 0936   CO2 26 09/13/2022 0936   GLUCOSE 80 09/13/2022 0936   BUN 16 09/13/2022 0936   BUN 10 04/05/2022 0839   CREATININE 0.86 09/13/2022 0936   CREATININE 0.98 09/23/2021 1440   CREATININE 0.93 11/05/2012 1015   CALCIUM 9.2 09/13/2022 0936   GFRNONAA >60 09/23/2021 1440   GFRAA >60 05/31/2019 0417   Lab Results  Component Value Date   HGBA1C 5.2 09/13/2022   HGBA1C 5.4 05/09/2016   Lab Results  Component Value Date   INSULIN 15.5 04/05/2022   Lab Results  Component Value Date   TSH 1.250 04/05/2022   CBC     Component Value Date/Time   WBC 8.7 04/05/2022 0839   WBC 7.2 09/23/2021 1440   WBC 8.1 09/16/2021 0847   RBC 4.65 04/05/2022 0839   RBC 4.01 09/23/2021 1440   HGB 13.8 04/05/2022 0839   HCT 42.1 04/05/2022 0839   PLT 333 04/05/2022 0839   MCV 91 04/05/2022 0839   MCH 29.7 04/05/2022 0839   MCH 29.9 09/23/2021 1440   MCHC 32.8 04/05/2022 0839   MCHC 33.1 09/23/2021 1440   RDW 11.6 (L) 04/05/2022 0839   Iron Studies No results found for: "IRON", "TIBC", "FERRITIN", "IRONPCTSAT" Lipid Panel     Component Value Date/Time   CHOL 164 04/05/2022 0839   TRIG 57 04/05/2022 0839   HDL 61 04/05/2022 0839   CHOLHDL 3 01/26/2021 1421   VLDL 9.0 01/26/2021 1421   LDLCALC 92 04/05/2022 0839   Hepatic Function Panel     Component Value Date/Time   PROT 7.0 09/13/2022 0936   PROT 7.2 04/05/2022 0839   ALBUMIN 4.1 09/13/2022 0936   ALBUMIN 4.4 04/05/2022 0839   AST 14 09/13/2022 0936   AST 15 09/23/2021 1440   ALT 13 09/13/2022 0936   ALT 12 09/23/2021 1440   ALKPHOS 53 09/13/2022 0936  BILITOT 0.3 09/13/2022 0936   BILITOT <0.2 04/05/2022 0839   BILITOT 0.4 09/23/2021 1440      Component Value Date/Time   TSH 1.250 04/05/2022 0839   Nutritional Lab Results  Component Value Date   VD25OH 21.7 (L) 07/28/2022   VD25OH 11.1 (L) 04/05/2022     Return in about 3 weeks (around 01/04/2023) for Shawn Rayburn - PA.Marland Kitchen She was informed of the importance of frequent follow up visits to maximize her success with intensive lifestyle modifications for her multiple health conditions.   ATTESTASTION STATEMENTS:  Reviewed by clinician on day of visit: allergies, medications, problem list, medical history, surgical history, family history, social history, and previous encounter notes.     Worthy Rancher, MD

## 2023-01-11 NOTE — Progress Notes (Signed)
.smr  Office: [phone]  /  Fax: [phone]  WEIGHT SUMMARY AND BIOMETRICS  Vitals Temp: 97.8 F (36.6 C) BP: 116/76 Pulse Rate: 68 SpO2: 100 %   Anthropometric Measurements Height: 5\' 5"  (1.651 m) Weight: 284 lb (128.8 kg) BMI (Calculated): 47.26 Weight at Last Visit: 286lb Weight Lost Since Last Visit: 2lb Weight Gained Since Last Visit: 0lb Starting Weight: 267 lb Peak Weight: 280lb   Body Composition  Body Fat %: 48.4 % Fat Mass (lbs): 137.8 lbs Muscle Mass (lbs): 139.4 lbs Total Body Water (lbs): 110.2 lbs Visceral Fat Rating : 14   Other Clinical Data Fasting: no Labs: no Today's Visit #: 13 Starting Date: 04/05/22     HPI  Chief Complaint: OBESITY  Stacy Mills is here to discuss her progress with her obesity treatment plan. She is on the keeping a food journal and adhering to recommended goals of 1500 calories and 100 grams of protein and practicing portion control and making smarter food choices, such as increasing vegetables and decreasing simple carbohydrates and states she is following her eating plan approximately 20 % of the time. She states she is exercising Strength training 60 minutes 3 times per week.   Interval History:  Since last office visit she down 2 lbs.  Bio impedence scale was reviewed with the patient: Up 6.2 lbs muscle mass Down 8.2 lbs adipose mass Up 3.6 lbs total body water  Struggles with meal planning and prepping.  Not successful with journaling. We discussed switching back to Category 3 plan with breakfast options and she was agreeable to change. She has an RD appointment, but not until October.   Hunger/appetite-Moderate control.  Cravings- denies excessive cravings. She gave up drinking sugar sweetened drinks/soda! Stress- Manageable. 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   Pharmacotherapy: Did not tolerate metformin or  topiramate.   TREATMENT PLAN FOR OBESITY: Suboptimal progress with plan thus far.  Has nutritionist referral but appt. Is not until October.  She has committed to going to the gym and is working with a Systems analyst.  She has given up drinking sugar sweetened drinks/sodas  Recommended Dietary Goals  Stacy Mills is currently in the action stage of change. As such, her goal is to continue weight management plan. She has agreed to the Category 3 Plan.  Behavioral Intervention  We discussed the following Behavioral Modification Strategies today: increasing lean protein intake, decreasing simple carbohydrates , increasing vegetables, increasing lower glycemic fruits, increasing fiber rich foods, increasing water intake, work on meal planning and preparation, decreasing eating out or consumption of processed foods, and making healthy choices when eating convenient foods, emotional eating strategies and understanding the difference between hunger signals and cravings, continue to practice mindfulness when eating, and planning for success.  Additional resources provided today: Category 3 plan and grocery list        100 Calorie protein snacks  Recommended Physical Activity Goals  Stacy Mills has been advised to work up to 150 minutes of moderate intensity aerobic activity a week and strengthening exercises 2-3 times per week for cardiovascular health, weight loss maintenance and preservation of muscle mass.   She has agreed to Continue current level of physical activity   Pharmacotherapy We discussed various medication options to help Stacy Mills with her weight loss efforts and we both agreed to continue to work on nutritional and behavioral strategies to promote weight loss.     Return in  about 4 weeks (around 02/09/2023).Marland Kitchen She was informed of the importance of frequent follow up visits to maximize her success with intensive lifestyle modifications for her multiple health conditions.  PHYSICAL  EXAM:  Blood pressure 116/76, pulse 68, temperature 97.8 F (36.6 C), height 5\' 5"  (1.651 m), weight 284 lb (128.8 kg), SpO2 100%. Body mass index is 47.26 kg/m.  General: She is overweight, cooperative, alert, well developed, and in no acute distress. PSYCH: Has normal mood, affect and thought process.   Cardiovascular: HR 60's regular, BP 116/76 Lungs: Normal breathing effort, no conversational dyspnea. Neuro: no focal deficit  DIAGNOSTIC DATA REVIEWED:  BMET    Component Value Date/Time   NA 139 09/13/2022 0936   NA 140 04/05/2022 0839   K 3.9 09/13/2022 0936   CL 106 09/13/2022 0936   CO2 26 09/13/2022 0936   GLUCOSE 80 09/13/2022 0936   BUN 16 09/13/2022 0936   BUN 10 04/05/2022 0839   CREATININE 0.86 09/13/2022 0936   CREATININE 0.98 09/23/2021 1440   CREATININE 0.93 11/05/2012 1015   CALCIUM 9.2 09/13/2022 0936   GFRNONAA >60 09/23/2021 1440   GFRAA >60 05/31/2019 0417   Lab Results  Component Value Date   HGBA1C 5.2 09/13/2022   HGBA1C 5.4 05/09/2016   Lab Results  Component Value Date   INSULIN 15.5 04/05/2022   Lab Results  Component Value Date   TSH 1.250 04/05/2022   CBC    Component Value Date/Time   WBC 8.7 04/05/2022 0839   WBC 7.2 09/23/2021 1440   WBC 8.1 09/16/2021 0847   RBC 4.65 04/05/2022 0839   RBC 4.01 09/23/2021 1440   HGB 13.8 04/05/2022 0839   HCT 42.1 04/05/2022 0839   PLT 333 04/05/2022 0839   MCV 91 04/05/2022 0839   MCH 29.7 04/05/2022 0839   MCH 29.9 09/23/2021 1440   MCHC 32.8 04/05/2022 0839   MCHC 33.1 09/23/2021 1440   RDW 11.6 (L) 04/05/2022 0839   Iron Studies No results found for: "IRON", "TIBC", "FERRITIN", "IRONPCTSAT" Lipid Panel     Component Value Date/Time   CHOL 164 04/05/2022 0839   TRIG 57 04/05/2022 0839   HDL 61 04/05/2022 0839   CHOLHDL 3 01/26/2021 1421   VLDL 9.0 01/26/2021 1421   LDLCALC 92 04/05/2022 0839   Hepatic Function Panel     Component Value Date/Time   PROT 7.0 09/13/2022 0936    PROT 7.2 04/05/2022 0839   ALBUMIN 4.1 09/13/2022 0936   ALBUMIN 4.4 04/05/2022 0839   AST 14 09/13/2022 0936   AST 15 09/23/2021 1440   ALT 13 09/13/2022 0936   ALT 12 09/23/2021 1440   ALKPHOS 53 09/13/2022 0936   BILITOT 0.3 09/13/2022 0936   BILITOT <0.2 04/05/2022 0839   BILITOT 0.4 09/23/2021 1440      Component Value Date/Time   TSH 1.250 04/05/2022 0839   Nutritional Lab Results  Component Value Date   VD25OH 21.7 (L) 07/28/2022   VD25OH 11.1 (L) 04/05/2022    ASSOCIATED CONDITIONS ADDRESSED TODAY  ASSESSMENT AND PLAN  Problem List Items Addressed This Visit     Vitamin D deficiency   Insulin resistance - Primary   Abnormal craving   Idiopathic intracranial hypertension   Obesity- Start BMI 44.43   BMI 45.0-49.9, adult (HCC) Current BMI 47.4  Insulin Resistance Last fasting insulin was 15.5. A1c was 5.2. Polyphagia:No Medication(s): None Did not tolerate metformin.  She is working on nutrition plan to decrease simple carbohydrates, increase lean  proteins and exercise to promote weight loss, improve glycemic control and prevent progression to Type 2 diabetes.   Lab Results  Component Value Date   HGBA1C 5.2 09/13/2022   HGBA1C 5.2 04/05/2022   HGBA1C 5.4 07/27/2021   HGBA1C 5.2 01/26/2021   HGBA1C 5.4 05/09/2016   Lab Results  Component Value Date   INSULIN 15.5 04/05/2022    Plan:  Continue working on nutrition plan to decrease simple carbohydrates, increase lean proteins and exercise to promote weight loss, improve glycemic control/insulin resistance and prevent progression to Type 2 diabetes.  She would like to continue to work on nutrition and exercise at this point.   Vitamin D Deficiency Vitamin D is not at goal of 50.  Most recent vitamin D level was 21.7. She is on  prescription ergocalciferol 50,000 IU weekly. No N/V or muscle weakness.  Lab Results  Component Value Date   VD25OH 21.7 (L) 07/28/2022   VD25OH 11.1 (L) 04/05/2022     Plan: Continue and refill  prescription ergocalciferol 50,000 IU weekly Low vitamin D levels can be associated with adiposity and may result in leptin resistance and weight gain. Also associated with fatigue. Currently on vitamin D supplementation without any adverse effects.  Recheck vitamin D level 3-4 times yearly to optimize supplementation/avoid over supplemenation.    ATTESTASTION STATEMENTS:  Reviewed by clinician on day of visit: allergies, medications, problem list, medical history, surgical history, family history, social history, and previous encounter notes.   I have personally spent 42 minutes total time today in preparation, patient care, nutritional counseling and documentation for this visit, including the following: review of clinical lab tests; review of medical tests/procedures/services.       , PA-C

## 2023-01-12 ENCOUNTER — Ambulatory Visit (INDEPENDENT_AMBULATORY_CARE_PROVIDER_SITE_OTHER): Payer: Medicaid Other | Admitting: Physician Assistant

## 2023-01-12 ENCOUNTER — Encounter (INDEPENDENT_AMBULATORY_CARE_PROVIDER_SITE_OTHER): Payer: Self-pay | Admitting: Physician Assistant

## 2023-01-12 VITALS — BP 116/76 | HR 68 | Temp 97.8°F | Ht 65.0 in | Wt 284.0 lb

## 2023-01-12 DIAGNOSIS — R638 Other symptoms and signs concerning food and fluid intake: Secondary | ICD-10-CM | POA: Diagnosis not present

## 2023-01-12 DIAGNOSIS — Z6841 Body Mass Index (BMI) 40.0 and over, adult: Secondary | ICD-10-CM | POA: Insufficient documentation

## 2023-01-12 DIAGNOSIS — E559 Vitamin D deficiency, unspecified: Secondary | ICD-10-CM

## 2023-01-12 DIAGNOSIS — E88819 Insulin resistance, unspecified: Secondary | ICD-10-CM | POA: Diagnosis not present

## 2023-01-12 DIAGNOSIS — E669 Obesity, unspecified: Secondary | ICD-10-CM

## 2023-01-12 DIAGNOSIS — G932 Benign intracranial hypertension: Secondary | ICD-10-CM

## 2023-01-12 MED ORDER — ERGOCALCIFEROL 1.25 MG (50000 UT) PO CAPS
50000.0000 [IU] | ORAL_CAPSULE | ORAL | 0 refills | Status: DC
Start: 2023-01-12 — End: 2023-03-09

## 2023-02-09 ENCOUNTER — Ambulatory Visit (INDEPENDENT_AMBULATORY_CARE_PROVIDER_SITE_OTHER): Payer: Medicaid Other | Admitting: Internal Medicine

## 2023-03-01 ENCOUNTER — Ambulatory Visit (INDEPENDENT_AMBULATORY_CARE_PROVIDER_SITE_OTHER): Payer: Medicaid Other | Admitting: Physician Assistant

## 2023-03-03 ENCOUNTER — Encounter: Payer: Medicaid Other | Attending: Internal Medicine | Admitting: Dietician

## 2023-03-03 ENCOUNTER — Encounter: Payer: Self-pay | Admitting: Dietician

## 2023-03-03 NOTE — Patient Instructions (Addendum)
Goal: aim to eat vegetables twice a day.   Goal: aim to limit eating out for dinner to once per week.   Other tips: Continue to go to the gym at least 3 days per week.   When snacking, aim to include a complex carb and protein.  At meals, aim to include 1/2 plate non-starchy vegetables, 1/4 plate protein, and 1/4 plate complex carbs.

## 2023-03-03 NOTE — Progress Notes (Signed)
Medical Nutrition Therapy  Appointment Start time:  [phone]  Appointment End time:  1000  Primary concerns today: meal planning   Referral diagnosis: E66.01 Preferred learning style: no preference indicated Learning readiness: ready   NUTRITION ASSESSMENT   Anthropometrics   Wt: 285.6 lbs  Clinical Medical Hx: allergies, depression, anxiety Medications: reviewed Labs: reviewed Notable Signs/Symptoms: none reported Food Allergies: grapes cause throat to itch  Lifestyle & Dietary Hx  Pt states she was food journaling and trying to limit to 1500 calorie diet but is not doing this any longer.   Pt states she wakes up at 6am and she works out in morning (goes to gym 3-4 times per week). Pt has been working with a Systems analyst for 2 months. 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  Pt reports she notices she snacks more on sweets during her menstrual cycle.  Pt state she has a 30 year old son who does football 6-8pm and is in pre-K. She states on football nights they usually pick up little caesars or mcdonalds.   Pt has a dog she walks sometimes for 15 minutes in the evening.   Pt states as she gets older she notices an issue with texture (bananas, apples).    Estimated daily fluid intake: 90+ oz Supplements: vitamin D2 Sleep: 3-8 hours Stress / self-care: moderate stress, enjoys reading Current average weekly physical activity: gym 3 times per week for 60 minutes  24-Hr Dietary Recall First Meal: 9am: maple chicken sausage sandwich from [employer] OR breakfast burrito Snack: none Second Meal: 12pm: sandwich OR cheeseburger (at in home care) OR at facility: beef tips and squash Snack: none  Third Meal: 50/50 home vs eating out. If home: spaghetti OR chicken and rice. If out: McDonalds or little caesars.  Snack: brownies Beverages: 90+ oz water, sweet tea if at mcdonalds, lemonade 2 cups  daily   NUTRITION DIAGNOSIS  NB-1.1 Food and nutrition-related knowledge deficit As related to lack of prior nutrition education by a nutrition professional.  As evidenced by pt report.   NUTRITION INTERVENTION  Nutrition education (E-1) on the following topics:   Plate Method Fruits & Vegetables: Aim to fill half your plate with a variety of fruits and vegetables. They are rich in vitamins, minerals, and fiber, and can help reduce the risk of chronic diseases. Choose a colorful assortment of fruits and vegetables to ensure you get a wide range of nutrients. Grains and Starches: Make at least half of your grain choices whole grains, such as brown rice, whole wheat bread, and oats. Whole grains provide fiber, which aids in digestion and healthy cholesterol levels. Aim for whole forms of starchy vegetables such as potatoes, sweet potatoes, beans, peas, and corn, which are fiber rich and provide many vitamins and minerals.  Protein: Incorporate lean sources of protein, such as poultry, fish, beans, nuts, and seeds, into your meals. Protein is essential for building and repairing tissues, staying full, balancing blood sugar, as well as supporting immune function. Dairy: Include low-fat or fat-free dairy products like milk, yogurt, and cheese in your diet. Dairy foods are excellent sources of calcium and vitamin D, which are crucial for bone health.  Physical Activity: Aim for 60 minutes of physical activity daily. Regular physical activity promotes overall health-including helping to reduce risk for heart disease and diabetes, promoting mental health, and helping [place] sleep better.  Handouts Provided Include  Plate Method  Learning Style & Readiness for Change Teaching method utilized: Visual & Auditory  Demonstrated degree of understanding via: Teach Back  Barriers to learning/adherence to lifestyle change: none  Goals Established by Pt  Goal: aim to eat vegetables twice a day.   Goal: aim  to limit eating out for dinner to once per week.   Other tips: Continue to go to the gym at least 3 days per week.   When snacking, aim to include a complex carb and protein.  At meals, aim to include 1/2 plate non-starchy vegetables, 1/4 plate protein, and 1/4 plate complex carbs.    MONITORING & EVALUATION Dietary intake, weekly physical activity, and follow up in 3 months.  Next Steps  Patient is to call for questions.

## 2023-03-09 ENCOUNTER — Encounter (INDEPENDENT_AMBULATORY_CARE_PROVIDER_SITE_OTHER): Payer: Self-pay | Admitting: Physician Assistant

## 2023-03-09 ENCOUNTER — Ambulatory Visit (INDEPENDENT_AMBULATORY_CARE_PROVIDER_SITE_OTHER): Payer: Medicaid Other | Admitting: Physician Assistant

## 2023-03-09 VITALS — BP 117/77 | HR 76 | Temp 98.0°F | Ht 65.0 in | Wt 278.0 lb

## 2023-03-09 DIAGNOSIS — E669 Obesity, unspecified: Secondary | ICD-10-CM

## 2023-03-09 DIAGNOSIS — E88819 Insulin resistance, unspecified: Secondary | ICD-10-CM | POA: Diagnosis not present

## 2023-03-09 DIAGNOSIS — Z6841 Body Mass Index (BMI) 40.0 and over, adult: Secondary | ICD-10-CM | POA: Diagnosis not present

## 2023-03-09 DIAGNOSIS — E559 Vitamin D deficiency, unspecified: Secondary | ICD-10-CM

## 2023-03-09 DIAGNOSIS — F5101 Primary insomnia: Secondary | ICD-10-CM

## 2023-03-09 DIAGNOSIS — R638 Other symptoms and signs concerning food and fluid intake: Secondary | ICD-10-CM

## 2023-03-09 MED ORDER — ERGOCALCIFEROL 1.25 MG (50000 UT) PO CAPS
50000.0000 [IU] | ORAL_CAPSULE | ORAL | 0 refills | Status: DC
Start: 2023-03-09 — End: 2023-03-27

## 2023-03-09 NOTE — Progress Notes (Signed)
.smr  Office: [phone]  /  Fax: [phone]  WEIGHT SUMMARY AND BIOMETRICS  Vitals Temp: 98 F (36.7 C) BP: 117/77 Pulse Rate: 76 SpO2: 99 %   Anthropometric Measurements Height: 5\' 5"  (1.651 m) Weight: 278 lb (126.1 kg) BMI (Calculated): 46.26 Weight at Last Visit: 284lb Weight Lost Since Last Visit: 6 lb Weight Gained Since Last Visit: 0 Starting Weight: 267lb Total Weight Loss (lbs): 0 lb (0 kg) Peak Weight: 280 lb   Body Composition  Body Fat %: 49.2 % Fat Mass (lbs): 137 lbs Muscle Mass (lbs): 134.2 lbs Total Body Water (lbs): 107.8 lbs Visceral Fat Rating : 14   Other Clinical Data Fasting: no Labs: no Today's Visit #: 14 Starting Date: 04/05/22     HPI  Chief Complaint: OBESITY  Stacy Mills is here to discuss her progress with her obesity treatment plan. She is on the the Category 3 Plan and states she is following her eating plan approximately 100 % of the time. She states she is exercising gym/personal trainer 90 minutes 3 times per week.   Interval History:  Since last office visit she is down 6 lbs.   Met with RD 03/03/2023 for nutrition counseling.  The patient, with a history of obesity, insulin resistance, and vitamin D deficiency, presents for a follow-up visit. Weight loss of six pounds since her last visit, which she attributes to adherence to a nutrition plan and regular exercise.  She has not tolerated previous treatments with topiramate or metformin.  The patient also reports that she has removed her Mirena IUD and is not currently using any form of birth control.  She has noticed increased cravings for sweets during her menstrual cycle since the removal of the IUD  . She reports no need for birth control at this time.   The patient also reports feeling tired, particularly on Mondays and Tuesdays after working long shifts over the weekend. She has trouble sleeping, often waking up in the middle of the night, and estimates that she gets  about six to seven hours of sleep per night. 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  Continues to do well giving up drinking sugar sweetened beverages.   Pharmacotherapy: Did not tolerate metformin or topamax.   TREATMENT PLAN FOR OBESITY:  Recommended Dietary Goals  Malani is currently in the action stage of change. As such, her goal is to continue weight management plan. She has agreed to the Category 3 Plan.  Behavioral Intervention  We discussed the following Behavioral Modification Strategies today: increasing lean protein intake to established goals, decreasing simple carbohydrates , increasing fiber rich foods, avoiding skipping meals, work on meal planning and preparation, and continue to work on maintaining a reduced calorie state, getting the recommended amount of protein, incorporating whole foods, making healthy choices, staying well hydrated and practicing mindfulness when eating..  Additional resources provided today: NA  Recommended Physical Activity Goals  Lexys has been advised to work up to 150 minutes of moderate intensity aerobic activity a week and strengthening exercises 2-3 times per week for cardiovascular health, weight loss maintenance and preservation of muscle mass.   She has agreed to Continue current level of physical activity  and Increase physical activity in their day and reduce sedentary time (increase NEAT).   Pharmacotherapy We discussed various medication options to help Marque with her weight loss efforts and we both agreed to continue to work on nutritional and behavioral strategies to promote weight loss.  Return in about 4 weeks (around 04/06/2023).Marland Kitchen She was informed of the importance of frequent follow up visits to maximize her success with intensive lifestyle modifications for her multiple health conditions.  PHYSICAL EXAM:  Blood pressure 117/77, pulse 76, temperature 98 F (36.7 C), height 5\' 5"  (1.651 m), weight 278 lb (126.1  kg), SpO2 99%. Body mass index is 46.26 kg/m.  General: She is overweight, cooperative, alert, well developed, and in no acute distress. PSYCH: Has normal mood, affect and thought process.   Cardiovascular: HR 70's BP 117/77 Lungs: Normal breathing effort, no conversational dyspnea.  DIAGNOSTIC DATA REVIEWED:  BMET    Component Value Date/Time   NA 139 09/13/2022 0936   NA 140 04/05/2022 0839   K 3.9 09/13/2022 0936   CL 106 09/13/2022 0936   CO2 26 09/13/2022 0936   GLUCOSE 80 09/13/2022 0936   BUN 16 09/13/2022 0936   BUN 10 04/05/2022 0839   CREATININE 0.86 09/13/2022 0936   CREATININE 0.98 09/23/2021 1440   CREATININE 0.93 11/05/2012 1015   CALCIUM 9.2 09/13/2022 0936   GFRNONAA >60 09/23/2021 1440   GFRAA >60 05/31/2019 0417   Lab Results  Component Value Date   HGBA1C 5.2 09/13/2022   HGBA1C 5.4 05/09/2016   Lab Results  Component Value Date   INSULIN 15.5 04/05/2022   Lab Results  Component Value Date   TSH 1.250 04/05/2022   CBC    Component Value Date/Time   WBC 8.7 04/05/2022 0839   WBC 7.2 09/23/2021 1440   WBC 8.1 09/16/2021 0847   RBC 4.65 04/05/2022 0839   RBC 4.01 09/23/2021 1440   HGB 13.8 04/05/2022 0839   HCT 42.1 04/05/2022 0839   PLT 333 04/05/2022 0839   MCV 91 04/05/2022 0839   MCH 29.7 04/05/2022 0839   MCH 29.9 09/23/2021 1440   MCHC 32.8 04/05/2022 0839   MCHC 33.1 09/23/2021 1440   RDW 11.6 (L) 04/05/2022 0839   Iron Studies No results found for: "IRON", "TIBC", "FERRITIN", "IRONPCTSAT" Lipid Panel     Component Value Date/Time   CHOL 164 04/05/2022 0839   TRIG 57 04/05/2022 0839   HDL 61 04/05/2022 0839   CHOLHDL 3 01/26/2021 1421   VLDL 9.0 01/26/2021 1421   LDLCALC 92 04/05/2022 0839   Hepatic Function Panel     Component Value Date/Time   PROT 7.0 09/13/2022 0936   PROT 7.2 04/05/2022 0839   ALBUMIN 4.1 09/13/2022 0936   ALBUMIN 4.4 04/05/2022 0839   AST 14 09/13/2022 0936   AST 15 09/23/2021 1440   ALT 13  09/13/2022 0936   ALT 12 09/23/2021 1440   ALKPHOS 53 09/13/2022 0936   BILITOT 0.3 09/13/2022 0936   BILITOT <0.2 04/05/2022 0839   BILITOT 0.4 09/23/2021 1440      Component Value Date/Time   TSH 1.250 04/05/2022 0839   Nutritional Lab Results  Component Value Date   VD25OH 21.7 (L) 07/28/2022   VD25OH 11.1 (L) 04/05/2022    ASSOCIATED CONDITIONS ADDRESSED TODAY  ASSESSMENT AND PLAN  Problem List Items Addressed This Visit     Vitamin D deficiency   Relevant Medications   ergocalciferol (VITAMIN D2) 1.25 MG (50000 UT) capsule   Insulin resistance - Primary   Abnormal craving   Obesity- Start BMI 44.43  Obesity Patient has lost 6 lbs since last visit. She has been following a nutrition plan and has been more active. She has a history of insulin resistance and vitamin D deficiency. She  has not tolerated topiramate or metformin. -Continue with current nutrition plan and physical activity. -Consider adding a multivitamin to her regimen. -Plan to recheck labs including A1c and vitamin D levels at next visit on 03/27/2023.  Insulin Resistance Last fasting insulin was 15.5- not at goal. A1c was 5.2- at goal. Polyphagia:No Medication(s): None Did not tolerate metformin.  She is working on Engineer, technical sales to decrease simple carbohydrates, increase lean proteins and exercise to promote weight loss, improve glycemic control and prevent progression to Type 2 diabetes.   Lab Results  Component Value Date   HGBA1C 5.2 09/13/2022   HGBA1C 5.2 04/05/2022   HGBA1C 5.4 07/27/2021   HGBA1C 5.2 01/26/2021   HGBA1C 5.4 05/09/2016   Lab Results  Component Value Date   INSULIN 15.5 04/05/2022    Plan:  Continue working on nutrition plan to decrease simple carbohydrates, increase lean proteins and exercise to promote weight loss, improve glycemic control and prevent progression to Type 2 diabetes.    Insomnia Patient reports inconsistent sleep patterns, sometimes waking up in the  middle of the night. She has tried melatonin gummies without significant improvement. -Encourage consistent sleep schedule, aiming for at least 8 hours of sleep per night. -Continue to monitor sleep patterns and consider further evaluation if no improvement.  Vitamin D Deficiency Patient has been non-compliant with vitamin D supplementation and has misplaced her medication. -Resend prescription for vitamin D to [facility]. -Encourage patient to take vitamin D regularly. -Plan to recheck vitamin D level at next visit on 03/27/2023.  ATTESTASTION STATEMENTS:  Reviewed by clinician on day of visit: allergies, medications, problem list, medical history, surgical history, family history, social history, and previous encounter notes.   I have personally spent 30 minutes total time today in preparation, patient care, nutritional counseling and documentation for this visit, including the following: review of clinical lab tests; review of medical tests/procedures/services.      Loralei Radcliffe, PA-C

## 2023-03-12 DIAGNOSIS — F5101 Primary insomnia: Secondary | ICD-10-CM | POA: Insufficient documentation

## 2023-03-27 ENCOUNTER — Encounter (INDEPENDENT_AMBULATORY_CARE_PROVIDER_SITE_OTHER): Payer: Self-pay | Admitting: Internal Medicine

## 2023-03-27 ENCOUNTER — Ambulatory Visit (INDEPENDENT_AMBULATORY_CARE_PROVIDER_SITE_OTHER): Payer: Medicaid Other | Admitting: Internal Medicine

## 2023-03-27 VITALS — BP 105/77 | HR 78 | Temp 98.0°F | Ht 65.0 in | Wt 270.0 lb

## 2023-03-27 DIAGNOSIS — E669 Obesity, unspecified: Secondary | ICD-10-CM

## 2023-03-27 DIAGNOSIS — Z6841 Body Mass Index (BMI) 40.0 and over, adult: Secondary | ICD-10-CM

## 2023-03-27 DIAGNOSIS — E88819 Insulin resistance, unspecified: Secondary | ICD-10-CM

## 2023-03-27 DIAGNOSIS — E559 Vitamin D deficiency, unspecified: Secondary | ICD-10-CM

## 2023-03-27 MED ORDER — VITAMIN D3 25 MCG (1000 UT) PO CAPS
1000.0000 [IU] | ORAL_CAPSULE | Freq: Every day | ORAL | Status: DC
Start: 2023-03-27 — End: 2023-08-14

## 2023-03-27 NOTE — Assessment & Plan Note (Signed)
 See obesity treatment plan

## 2023-03-27 NOTE — Progress Notes (Signed)
Office: [phone]  /  Fax: [phone]  WEIGHT SUMMARY AND BIOMETRICS  Vitals Temp: 98 F (36.7 C) BP: 105/77 Pulse Rate: 78 SpO2: 98 %   Anthropometric Measurements Height: 5\' 5"  (1.651 m) Weight: 270 lb (122.5 kg) BMI (Calculated): 44.93 Weight at Last Visit: 278 lb Weight Lost Since Last Visit: 8 lb Weight Gained Since Last Visit: 0 lb Starting Weight: 276 lb Total Weight Loss (lbs): 0 lb (0 kg) Peak Weight: 280 lb   Body Composition  Body Fat %: 47.1 % Fat Mass (lbs): 127.6 lbs Muscle Mass (lbs): 136 lbs Total Body Water (lbs): 100.2 lbs Visceral Fat Rating : 13    No data recorded Today's Visit #: 15  Starting Date: 04/05/22   HPI  Chief Complaint: OBESITY  Stacy Mills is here to discuss her progress with her obesity treatment plan. She is on the the Category 1 Plan and states she is following her eating plan approximately 90 % of the time. She states she is exercising 60 minutes 3-4 times per week.  Interval History:  Since last office visit she has lost 8 pounds. She reports good adherence to reduced calorie nutritional plan. She has been working on not skipping meals, increasing protein intake at every meal, eating more fruits, eating more vegetables, drinking more water, making healthier choices, continues to exercise, reducing portion sizes, and incorporating more whole foods  Orexigenic Control: Denies problems with appetite and hunger signals.  Denies problems with satiety and satiation.  Denies problems with eating patterns and portion control.  Denies abnormal cravings. Denies feeling deprived or restricted.   Barriers identified: none.   Pharmacotherapy for weight loss: None  ASSESSMENT AND PLAN  TREATMENT PLAN FOR OBESITY:  Recommended Dietary Goals  Nabila is currently in the action stage of change. As such, her goal is to continue weight management plan. She has agreed to: continue current plan  Behavioral Intervention  We  discussed the following Behavioral Modification Strategies today: continue to work on maintaining a reduced calorie state, getting the recommended amount of protein, incorporating whole foods, making healthy choices, staying well hydrated and practicing mindfulness when eating..  Additional resources provided today: None  Recommended Physical Activity Goals  Christne has been advised to work up to 150 minutes of moderate intensity aerobic activity a week and strengthening exercises 2-3 times per week for cardiovascular health, weight loss maintenance and preservation of muscle mass.   She has agreed to :  Continue current level of physical activity   Pharmacotherapy We discussed various medication options to help Ilisha with her weight loss efforts and we both agreed to : continue with nutritional and behavioral strategies  ASSOCIATED CONDITIONS ADDRESSED TODAY  Insulin resistance Assessment & Plan: Stable.  Will check fasting blood glucose and insulin levels today.  She had an A1c that was normal earlier this year.  Continue reducing simple and added sugars in her diet.  Continue with physical activity  She did not tolerate metformin.  She may be a candidate for incretin therapy if covered by her insurance.  Orders: -     Glucose, fasting -     Lipid Panel With LDL/HDL Ratio -     Insulin, random  Vitamin D deficiency Assessment & Plan: Patient has completed several courses of high-dose vitamin D supplementation.  Will check vitamin D levels today and transition to over-the-counter vitamin D3 1000 international units daily for maintenance.  Orders: -     VITAMIN D 25 Hydroxy (Vit-D Deficiency, Fractures)  Obesity- Start BMI 44.43 Assessment & Plan: See obesity treatment plan   Other orders -     Vitamin D3; Take 1 capsule (1,000 Units total) by mouth daily.    PHYSICAL EXAM:  Blood pressure 105/77, pulse 78, temperature 98 F (36.7 C), height 5\' 5"  (1.651 m), weight 270  lb (122.5 kg), last menstrual period 03/01/2023, SpO2 98%. Body mass index is 44.93 kg/m.  General: She is overweight, cooperative, alert, well developed, and in no acute distress. PSYCH: Has normal mood, affect and thought process.   HEENT: EOMI, sclerae are anicteric. Lungs: Normal breathing effort, no conversational dyspnea. Extremities: No edema.  Neurologic: No gross sensory or motor deficits. No tremors or fasciculations noted.    DIAGNOSTIC DATA REVIEWED:  BMET    Component Value Date/Time   NA 139 09/13/2022 0936   NA 140 04/05/2022 0839   K 3.9 09/13/2022 0936   CL 106 09/13/2022 0936   CO2 26 09/13/2022 0936   GLUCOSE 80 09/13/2022 0936   BUN 16 09/13/2022 0936   BUN 10 04/05/2022 0839   CREATININE 0.86 09/13/2022 0936   CREATININE 0.98 09/23/2021 1440   CREATININE 0.93 11/05/2012 1015   CALCIUM 9.2 09/13/2022 0936   GFRNONAA >60 09/23/2021 1440   GFRAA >60 05/31/2019 0417   Lab Results  Component Value Date   HGBA1C 5.2 09/13/2022   HGBA1C 5.4 05/09/2016   Lab Results  Component Value Date   INSULIN 15.5 04/05/2022   Lab Results  Component Value Date   TSH 1.250 04/05/2022   CBC    Component Value Date/Time   WBC 8.7 04/05/2022 0839   WBC 7.2 09/23/2021 1440   WBC 8.1 09/16/2021 0847   RBC 4.65 04/05/2022 0839   RBC 4.01 09/23/2021 1440   HGB 13.8 04/05/2022 0839   HCT 42.1 04/05/2022 0839   PLT 333 04/05/2022 0839   MCV 91 04/05/2022 0839   MCH 29.7 04/05/2022 0839   MCH 29.9 09/23/2021 1440   MCHC 32.8 04/05/2022 0839   MCHC 33.1 09/23/2021 1440   RDW 11.6 (L) 04/05/2022 0839   Iron Studies No results found for: "IRON", "TIBC", "FERRITIN", "IRONPCTSAT" Lipid Panel     Component Value Date/Time   CHOL 164 04/05/2022 0839   TRIG 57 04/05/2022 0839   HDL 61 04/05/2022 0839   CHOLHDL 3 01/26/2021 1421   VLDL 9.0 01/26/2021 1421   LDLCALC 92 04/05/2022 0839   Hepatic Function Panel     Component Value Date/Time   PROT 7.0 09/13/2022  0936   PROT 7.2 04/05/2022 0839   ALBUMIN 4.1 09/13/2022 0936   ALBUMIN 4.4 04/05/2022 0839   AST 14 09/13/2022 0936   AST 15 09/23/2021 1440   ALT 13 09/13/2022 0936   ALT 12 09/23/2021 1440   ALKPHOS 53 09/13/2022 0936   BILITOT 0.3 09/13/2022 0936   BILITOT <0.2 04/05/2022 0839   BILITOT 0.4 09/23/2021 1440      Component Value Date/Time   TSH 1.250 04/05/2022 0839   Nutritional Lab Results  Component Value Date   VD25OH 21.7 (L) 07/28/2022   VD25OH 11.1 (L) 04/05/2022     Return in about 4 weeks (around 04/24/2023) for For Weight Mangement with Dr. Rikki Spearing.Marland Kitchen She was informed of the importance of frequent follow up visits to maximize her success with intensive lifestyle modifications for her multiple health conditions.   ATTESTASTION STATEMENTS:  Reviewed by clinician on day of visit: allergies, medications, problem list, medical history, surgical history, family history, social  history, and previous encounter notes.     Worthy Rancher, MD

## 2023-03-27 NOTE — Assessment & Plan Note (Signed)
Patient has completed several courses of high-dose vitamin D supplementation.  Will check vitamin D levels today and transition to over-the-counter vitamin D3 1000 international units daily for maintenance.

## 2023-03-27 NOTE — Assessment & Plan Note (Addendum)
Stable.  Will check fasting blood glucose and insulin levels today.  She had an A1c that was normal earlier this year.  Continue reducing simple and added sugars in her diet.  Continue with physical activity  She did not tolerate metformin.  She may be a candidate for incretin therapy if covered by her insurance.

## 2023-03-29 ENCOUNTER — Ambulatory Visit: Payer: Medicaid Other | Admitting: Nurse Practitioner

## 2023-03-29 ENCOUNTER — Other Ambulatory Visit (HOSPITAL_COMMUNITY)
Admission: RE | Admit: 2023-03-29 | Discharge: 2023-03-29 | Disposition: A | Discharge status: Home / Self Care | Payer: Medicaid Other | Source: Ambulatory Visit | Attending: Nurse Practitioner | Admitting: Nurse Practitioner

## 2023-03-29 ENCOUNTER — Encounter: Payer: Self-pay | Admitting: Nurse Practitioner

## 2023-03-29 VITALS — BP 124/89 | HR 91 | Temp 98.7°F | Resp 18 | Ht 65.0 in | Wt 275.4 lb

## 2023-03-29 DIAGNOSIS — N760 Acute vaginitis: Secondary | ICD-10-CM | POA: Diagnosis not present

## 2023-03-29 DIAGNOSIS — B9689 Other specified bacterial agents as the cause of diseases classified elsewhere: Secondary | ICD-10-CM | POA: Diagnosis not present

## 2023-03-29 DIAGNOSIS — Z1159 Encounter for screening for other viral diseases: Secondary | ICD-10-CM

## 2023-03-29 DIAGNOSIS — N644 Mastodynia: Secondary | ICD-10-CM | POA: Diagnosis not present

## 2023-03-29 DIAGNOSIS — N912 Amenorrhea, unspecified: Secondary | ICD-10-CM | POA: Diagnosis not present

## 2023-03-29 LAB — LIPID PANEL WITH LDL/HDL RATIO
Cholesterol, Total: 154 mg/dL (ref 100–199)
HDL: 60 mg/dL (ref >39)
LDL Chol Calc (NIH): 83 mg/dL (ref 0–99)
LDL/HDL Ratio: 1.4 ratio (ref 0.0–3.2)
Triglycerides: 55 mg/dL (ref 0–149)
VLDL Cholesterol Cal: 11 mg/dL (ref 5–40)

## 2023-03-29 LAB — GLUCOSE, FASTING: Glucose, Plasma: 81 mg/dL (ref 70–99)

## 2023-03-29 LAB — VITAMIN D 25 HYDROXY (VIT D DEFICIENCY, FRACTURES): Vit D, 25-Hydroxy: 19.8 ng/mL — ABNORMAL LOW (ref 30.0–100.0)

## 2023-03-29 LAB — POCT URINE PREGNANCY: Preg Test, Ur: NEGATIVE

## 2023-03-29 LAB — INSULIN, RANDOM: INSULIN: 15.4 u[IU]/mL (ref 2.6–24.9)

## 2023-03-29 NOTE — Progress Notes (Signed)
Acute Office Visit  Subjective:    Patient ID: Stacy Mills, female    DOB: 1995-06-16, 27 y.o.   MRN: [id number]  Chief Complaint  Patient presents with   office visit     PT is here for STD, BV and pregnancy testing    Vaginal Discharge The patient's primary symptoms include vaginal discharge. The patient's pertinent negatives include no genital itching, genital lesions, genital odor, genital rash, missed menses, pelvic pain or vaginal bleeding. This is a new problem. The current episode started in the past 7 days. The problem occurs constantly. The problem has been unchanged. The patient is experiencing no pain. Pregnant now: unknown. Pertinent negatives include no abdominal pain, anorexia, back pain, chills, constipation, diarrhea, discolored urine, dysuria, fever, flank pain, frequency, headaches, hematuria, joint pain, joint swelling, nausea, painful intercourse, rash, sore throat, urgency or vomiting. The vaginal discharge was thin and clear. There has been no bleeding. She has not been passing clots. She has not been passing tissue. Nothing aggravates the symptoms. She has tried nothing for the symptoms. She is sexually active. It is unknown whether or not her partner has an STD. She uses nothing for contraception. Her menstrual history has been regular. There is no history of endometriosis, herpes simplex, PID, an STD or vaginosis.   She also complains of right breast tenderness x 1week, no nipple discharge, no mass, no dimple of skin.  Outpatient Medications Prior to Visit  Medication Sig   acetaminophen (TYLENOL) 325 MG tablet Take 650 mg by mouth every 6 (six) hours as needed for mild pain or moderate pain.   cetirizine (ZYRTEC) 10 MG tablet TAKE 1 TABLET BY MOUTH EVERYDAY AT BEDTIME   Cholecalciferol (VITAMIN D3) 25 MCG (1000 UT) CAPS Take 1 capsule (1,000 Units total) by mouth daily. (Patient not taking: Reported on 03/29/2023)   No facility-administered medications prior to  visit.    Reviewed past medical and social history.  Review of Systems  Constitutional:  Negative for chills and fever.  HENT:  Negative for sore throat.   Gastrointestinal:  Negative for abdominal pain, anorexia, constipation, diarrhea, nausea and vomiting.  Genitourinary:  Positive for vaginal discharge. Negative for dysuria, flank pain, frequency, hematuria, missed menses, pelvic pain and urgency.  Musculoskeletal:  Negative for back pain and joint pain.  Skin:  Negative for rash.  Neurological:  Negative for headaches.   Per HPI     Objective:    Physical Exam Vitals and nursing note reviewed.  Cardiovascular:     Rate and Rhythm: Normal rate.     Pulses: Normal pulses.  Pulmonary:     Effort: Pulmonary effort is normal.  Chest:     Chest wall: Tenderness present.  Breasts:    Breasts are symmetrical.     Right: Tenderness present. No swelling, bleeding, inverted nipple, mass, nipple discharge or skin change.     Left: Normal.    Lymphadenopathy:     Upper Body:     Right upper body: No supraclavicular, axillary or pectoral adenopathy.     Left upper body: No supraclavicular, axillary or pectoral adenopathy.  Skin:    General: Skin is warm and dry.     Findings: No erythema or rash.  Neurological:     Mental Status: She is alert and oriented to person, place, and time.     BP 124/89 (BP Location: Right Arm, Patient Position: Sitting, Cuff Size: Large) Comment: recheck BP reading  Pulse 91   Temp 98.7 F (  37.1 C) (Temporal)   Resp 18   Ht 5\' 5"  (1.651 m)   Wt 275 lb 6.4 oz (124.9 kg)   LMP 03/01/2023 (Exact Date)   SpO2 100%   BMI 45.83 kg/m    Results for orders placed or performed in visit on 03/29/23  POCT urine pregnancy  Result Value Ref Range   Preg Test, Ur Negative Negative      Assessment & Plan:   Problem List Items Addressed This Visit   None Visit Diagnoses     Encounter for screening for viral disease    -  Primary   Relevant  Orders   Cervicovaginal ancillary only( [place])   HIV antibody (with reflex)   RPR   Amenorrhea       Relevant Orders   POCT urine pregnancy (Completed)   Breast pain, right       Relevant Orders   US BREAST COMPLETE UNI RIGHT INC AXILLA      No orders of the defined types were placed in this encounter.  Return if symptoms worsen or fail to improve.    Alysia Penna, NP

## 2023-03-29 NOTE — Patient Instructions (Signed)
Negative urine pregnancy You will be contacted to schedule appointment for breast US

## 2023-03-30 LAB — RPR: RPR Ser Ql: NONREACTIVE

## 2023-03-30 LAB — CERVICOVAGINAL ANCILLARY ONLY
Bacterial Vaginitis (gardnerella): POSITIVE — AB
Candida Glabrata: NEGATIVE
Candida Vaginitis: NEGATIVE
Chlamydia: NEGATIVE
Comment: NEGATIVE
Comment: NEGATIVE
Comment: NEGATIVE
Comment: NEGATIVE
Comment: NEGATIVE
Comment: NORMAL
Neisseria Gonorrhea: NEGATIVE
Trichomonas: NEGATIVE

## 2023-03-30 LAB — HIV ANTIBODY (ROUTINE TESTING W REFLEX): HIV 1&2 Ab, 4th Generation: NONREACTIVE

## 2023-03-30 MED ORDER — METRONIDAZOLE 0.75 % VA GEL: 1.0000 | Freq: Every day | VAGINAL | 0 refills | Status: DC

## 2023-03-30 NOTE — Addendum Note (Signed)
Addended by: Michaela Corner on: 03/30/2023 06:50 PM   Modules accepted: Orders

## 2023-04-10 ENCOUNTER — Telehealth (INDEPENDENT_AMBULATORY_CARE_PROVIDER_SITE_OTHER): Payer: Self-pay

## 2023-04-10 NOTE — Telephone Encounter (Signed)
Spoke to patient re: Vit D prescription and lab results

## 2023-04-11 ENCOUNTER — Ambulatory Visit
Admission: RE | Admit: 2023-04-11 | Discharge: 2023-04-11 | Disposition: A | Discharge status: Home / Self Care | Payer: Medicaid Other | Source: Ambulatory Visit | Attending: Nurse Practitioner | Admitting: Nurse Practitioner

## 2023-04-11 ENCOUNTER — Other Ambulatory Visit: Payer: Self-pay | Admitting: Nurse Practitioner

## 2023-04-11 ENCOUNTER — Ambulatory Visit
Admission: RE | Admit: 2023-04-11 | Discharge: 2023-04-11 | Disposition: A | Discharge status: Home / Self Care | Payer: Medicaid Other | Source: Ambulatory Visit | Attending: Nurse Practitioner

## 2023-04-11 DIAGNOSIS — N644 Mastodynia: Secondary | ICD-10-CM

## 2023-04-26 ENCOUNTER — Ambulatory Visit (INDEPENDENT_AMBULATORY_CARE_PROVIDER_SITE_OTHER): Payer: Medicaid Other | Admitting: Physician Assistant

## 2023-05-18 ENCOUNTER — Encounter (INDEPENDENT_AMBULATORY_CARE_PROVIDER_SITE_OTHER): Payer: Self-pay | Admitting: Internal Medicine

## 2023-05-18 ENCOUNTER — Ambulatory Visit (INDEPENDENT_AMBULATORY_CARE_PROVIDER_SITE_OTHER): Payer: Medicaid Other | Admitting: Internal Medicine

## 2023-05-18 VITALS — BP 138/87 | HR 104 | Temp 98.2°F | Ht 65.0 in | Wt 283.0 lb

## 2023-05-18 DIAGNOSIS — G932 Benign intracranial hypertension: Secondary | ICD-10-CM

## 2023-05-18 DIAGNOSIS — Z6841 Body Mass Index (BMI) 40.0 and over, adult: Secondary | ICD-10-CM

## 2023-05-18 DIAGNOSIS — E66813 Obesity, class 3: Secondary | ICD-10-CM | POA: Diagnosis not present

## 2023-05-18 DIAGNOSIS — R638 Other symptoms and signs concerning food and fluid intake: Secondary | ICD-10-CM | POA: Diagnosis not present

## 2023-05-18 DIAGNOSIS — E88819 Insulin resistance, unspecified: Secondary | ICD-10-CM | POA: Diagnosis not present

## 2023-05-18 MED ORDER — ZEPBOUND 2.5 MG/0.5ML ~~LOC~~ SOAJ: 2.5000 mg | SUBCUTANEOUS | 0 refills | Status: DC

## 2023-05-18 NOTE — Progress Notes (Signed)
Office: [phone]  /  Fax: [phone]  Weight Summary And Biometrics  Vitals Temp: 98.2 F (36.8 C) BP: 138/87 Pulse Rate: (!) 104 SpO2: 99 %   Anthropometric Measurements Height: 5\' 5"  (1.651 m) Weight: 283 lb (128.4 kg) BMI (Calculated): 47.09 Weight at Last Visit: 270 lb Weight Lost Since Last Visit: 0 Weight Gained Since Last Visit: 13 lb Starting Weight: 276 lb Total Weight Loss (lbs): 0 lb (0 kg) Peak Weight: 283 lb   Body Composition  Body Fat %: 49.4 % Fat Mass (lbs): 140 lbs Muscle Mass (lbs): 136 lbs Total Body Water (lbs): 104.8 lbs Visceral Fat Rating : 14    No data recorded Today's Visit #: 16  Starting Date: 04/05/22   Subjective   Chief Complaint: Obesity  Stacy Mills is here to discuss her progress with her obesity treatment plan. She is on the the Category 1 Plan and states she is following her eating plan approximately 90 % of the time. She states she is not exercising .  Interval History:   Discussed the use of AI scribe software for clinical note transcription with the patient, who gave verbal consent to proceed.  History of Present Illness   Stacy Mills presents today for medical weight management.  Since last office visit she has gained 13 pounds.  She reports a recent increase in cravings for sweets, but denies skipping meals or changes in portion sizes. She expresses a desire to return to the gym and start a new exercise regimen in the new year. The patient has been on metformin for high insulin levels and is considering starting a GLP-1 drug for weight management.  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 She recently took a seven-day cruise, during which she did not maintain her usual exercise routine.  The patient has a history of successful weight loss when she was able to maintain a consistent exercise routine and diet. However, she reports  that recent holidays and vacations, as well as increased work shifts to compensate for time off, have disrupted her routine and led to weight gain.  The patient's insurance recently changed, and she is unsure if her new plan will cover the GLP-1 drug. She expresses a willingness to try the medication if it is covered.  The patient has not been exercising recently, but plans to start again in the new year.   The patient acknowledges the challenges of weight management but expresses a commitment to improving her health. She recalls a previous period of successful weight loss and expresses a desire to return to that level of discipline and progress.       Orexigenic Control:  Reports problems with appetite and hunger signals.  Reports problems with satiety and satiation.  Denies problems with eating patterns and portion control.  Reports abnormal cravings. Denies feeling deprived or restricted.   Barriers identified: strong hunger signals and impaired satiety / inhibitory control, having difficulty with meal prep and planning, need for convenience or prepackaged foods, exposure to enticing environments and/or relationships, low volume of physical activity at present , difficulty implementing reduced calorie nutrition plan, and difficulty maintaining a reduced calorie state.   Pharmacotherapy for weight loss: She is currently taking no anti-obesity medication and had been on topiramate and metformin in the past but did not tolerate medication. .   Assessment and Plan   Treatment Plan For Obesity:  Recommended Dietary Goals  Stacy Mills is currently in the action stage of change. As such, her goal is to continue weight management plan. She has agreed to: continue to work on implementation of reduced calorie nutrition plan (RCNP)  Behavioral Intervention  We discussed the following Behavioral Modification Strategies today: continue to work on maintaining a reduced calorie state, getting the  recommended amount of protein, incorporating whole foods, making healthy choices, staying well hydrated and practicing mindfulness when eating..  Additional resources provided today: Personalized instruction on the use of artificial intelligence for recipes, calorie tracking, and finding healthier options when eating out.   Recommended Physical Activity Goals  Stacy Mills has been advised to work up to 150 minutes of moderate intensity aerobic activity a week and strengthening exercises 2-3 times per week for cardiovascular health, weight loss maintenance and preservation of muscle mass.   She has agreed to :  Think about enjoyable ways to increase daily physical activity and overcoming barriers to exercise and Increase physical activity in their day and reduce sedentary time (increase NEAT).  Pharmacotherapy  We discussed various medication options to help Stacy Mills with her weight loss efforts and we both agreed to : start anti-obesity medication.  In addition to reduced calorie nutrition plan (RCNP), behavioral strategies and physical activity, [name] would benefit from pharmacotherapy to assist with hunger signals, satiety and cravings. This will reduce obesity-related health risks by inducing weight loss, and help reduce food consumption and adherence to [facility]) . It may also improve QOL by improving self-confidence and reduce the  setbacks associated with metabolic adaptations.  After discussion of treatment options, mechanisms of action, benefits, side effects, contraindications and shared decision making she is agreeable to starting Zepbound 2.5 mg once a week. Patient also made aware that medication is indicated for long-term management of obesity and the risk of weight regain following discontinuation of treatment and hence the importance of adhering to medical weight loss plan.  We demonstrated use of device and patient using teach back method was able to demonstrate proper technique.  Associated  Conditions Addressed and Impacted by Obesity Treatment  Class 3 severe obesity with serious comorbidity and body mass index (BMI) of 40.0 to 44.9 in adult, unspecified obesity type [facility]) Assessment & Plan: Stacy Mills has been coming to our program for about 12 months, she has had a difficult time losing weight primarily due to difficulties maintaining a reduced calorie state because of strong orexigenic signaling.  I feel that she would benefit from GLP-1 therapy.  After discussion of benefits and side effects she will be started on Zepbound 2.5 mg once a week.  I also educated her again on the basics of healthy eating and provided her with additional resources to obtain recipe ideas, ideas for meal prepping and also tracking and monitoring caloric intake.  She is also thinking about increasing physical activity over the next month.  Orders: -     Zepbound; Inject 2.5 mg into the skin once a week.  Dispense: 2 mL; Refill: 0  Insulin resistance Assessment & Plan: Her HOMA-IR is 3.09 which is elevated. Optimal level < 1.9.   This is complex condition associated with genetics, ectopic fat and lifestyle factors. Insulin resistance may result in increased fat storage, inhibition of the breakdown of fat, cause fluctuations in blood sugar leading to energy crashes and increased cravings for sugary or high carb foods and cause metabolic slowdown making it difficult to lose weight.  This may result in additional weight gain and lead to pre-diabetes and diabetes  if untreated. In addition, hyperinsulinemia increases cardiovascular risk, chronic inflammatory response and may increase the risk of obesity related malignancies.  Lab Results  Component Value Date   HGBA1C 5.2 09/13/2022   Lab Results  Component Value Date   INSULIN 15.4 03/27/2023   INSULIN 15.5 04/05/2022   Lab Results  Component Value Date   GLUCOSE 80 09/13/2022   GLUCOSE 112 (H) 07/14/2012    We reviewed treatment options which  include losing 7 to 10% of body weight, increasing volume of physical activity and maintaining a diet low in saturated fats and with a low glycemic load.  Patient has also been educated on the carb insulin model of obesity.  She did not tolerate metformin I feel that she is a good candidate for GLP-1 therapy.   Orders: -     Zepbound; Inject 2.5 mg into the skin once a week.  Dispense: 2 mL; Refill: 0  Benign intracranial hypertension Assessment & Plan: Losing weight will improve headache frequency she will continue with medical weight loss   Abnormal food appetite Assessment & Plan: Unchanged. She has increased orexigenic signaling, impaired satiety and inhibitory control. This is secondary to an abnormal energy regulation system and pathological neurohormonal pathways characteristic of excess adiposity.  In addition to nutritional and behavioral strategies she benefits from pharmacotherapy.        Objective   Physical Exam:  Blood pressure 138/87, pulse (!) 104, temperature 98.2 F (36.8 C), height 5\' 5"  (1.651 m), weight 283 lb (128.4 kg), SpO2 99%. Body mass index is 47.09 kg/m.  General: She is overweight, cooperative, alert, well developed, and in no acute distress. PSYCH: Has normal mood, affect and thought process.   HEENT: EOMI, sclerae are anicteric. Lungs: Normal breathing effort, no conversational dyspnea. Extremities: No edema.  Neurologic: No gross sensory or motor deficits. No tremors or fasciculations noted.    Diagnostic Data Reviewed:  BMET    Component Value Date/Time   NA 139 09/13/2022 0936   NA 140 04/05/2022 0839   K 3.9 09/13/2022 0936   CL 106 09/13/2022 0936   CO2 26 09/13/2022 0936   GLUCOSE 80 09/13/2022 0936   BUN 16 09/13/2022 0936   BUN 10 04/05/2022 0839   CREATININE 0.86 09/13/2022 0936   CREATININE 0.98 09/23/2021 1440   CREATININE 0.93 11/05/2012 1015   CALCIUM 9.2 09/13/2022 0936   GFRNONAA >60 09/23/2021 1440   GFRAA >60  05/31/2019 0417   Lab Results  Component Value Date   HGBA1C 5.2 09/13/2022   HGBA1C 5.4 05/09/2016   Lab Results  Component Value Date   INSULIN 15.4 03/27/2023   INSULIN 15.5 04/05/2022   Lab Results  Component Value Date   TSH 1.250 04/05/2022   CBC    Component Value Date/Time   WBC 8.7 04/05/2022 0839   WBC 7.2 09/23/2021 1440   WBC 8.1 09/16/2021 0847   RBC 4.65 04/05/2022 0839   RBC 4.01 09/23/2021 1440   HGB 13.8 04/05/2022 0839   HCT 42.1 04/05/2022 0839   PLT 333 04/05/2022 0839   MCV 91 04/05/2022 0839   MCH 29.7 04/05/2022 0839   MCH 29.9 09/23/2021 1440   MCHC 32.8 04/05/2022 0839   MCHC 33.1 09/23/2021 1440   RDW 11.6 (L) 04/05/2022 0839   Iron Studies No results found for: "IRON", "TIBC", "FERRITIN", "IRONPCTSAT" Lipid Panel     Component Value Date/Time   CHOL 154 03/27/2023 0918   TRIG 55 03/27/2023 0918   HDL 60  03/27/2023 0918   CHOLHDL 3 01/26/2021 1421   VLDL 9.0 01/26/2021 1421   LDLCALC 83 03/27/2023 0918   Hepatic Function Panel     Component Value Date/Time   PROT 7.0 09/13/2022 0936   PROT 7.2 04/05/2022 0839   ALBUMIN 4.1 09/13/2022 0936   ALBUMIN 4.4 04/05/2022 0839   AST 14 09/13/2022 0936   AST 15 09/23/2021 1440   ALT 13 09/13/2022 0936   ALT 12 09/23/2021 1440   ALKPHOS 53 09/13/2022 0936   BILITOT 0.3 09/13/2022 0936   BILITOT <0.2 04/05/2022 0839   BILITOT 0.4 09/23/2021 1440      Component Value Date/Time   TSH 1.250 04/05/2022 0839   Nutritional Lab Results  Component Value Date   VD25OH 19.8 (L) 03/27/2023   VD25OH 21.7 (L) 07/28/2022   VD25OH 11.1 (L) 04/05/2022    Follow-Up   Return in about 4 weeks (around 06/15/2023) for For Weight Mangement with Dr. Rikki Spearing.Marland Kitchen She was informed of the importance of frequent follow up visits to maximize her success with intensive lifestyle modifications for her multiple health conditions.  Attestation Statement   Reviewed by clinician on day of visit: allergies,  medications, problem list, medical history, surgical history, family history, social history, and previous encounter notes.     Worthy Rancher, MD

## 2023-05-19 NOTE — Assessment & Plan Note (Signed)
Unchanged. She has increased orexigenic signaling, impaired satiety and inhibitory control. This is secondary to an abnormal energy regulation system and pathological neurohormonal pathways characteristic of excess adiposity.  In addition to nutritional and behavioral strategies she benefits from pharmacotherapy.

## 2023-05-19 NOTE — Assessment & Plan Note (Signed)
Stacy Mills has been coming to our program for about 12 months, she has had a difficult time losing weight primarily due to difficulties maintaining a reduced calorie state because of strong orexigenic signaling.  I feel that she would benefit from GLP-1 therapy.  After discussion of benefits and side effects she will be started on Zepbound 2.5 mg once a week.  I also educated her again on the basics of healthy eating and provided her with additional resources to obtain recipe ideas, ideas for meal prepping and also tracking and monitoring caloric intake.  She is also thinking about increasing physical activity over the next month.

## 2023-05-19 NOTE — Assessment & Plan Note (Signed)
Losing weight will improve headache frequency she will continue with medical weight loss

## 2023-05-19 NOTE — Assessment & Plan Note (Signed)
Her HOMA-IR is 3.09 which is elevated. Optimal level < 1.9.   This is complex condition associated with genetics, ectopic fat and lifestyle factors. Insulin resistance may result in increased fat storage, inhibition of the breakdown of fat, cause fluctuations in blood sugar leading to energy crashes and increased cravings for sugary or high carb foods and cause metabolic slowdown making it difficult to lose weight.  This may result in additional weight gain and lead to pre-diabetes and diabetes if untreated. In addition, hyperinsulinemia increases cardiovascular risk, chronic inflammatory response and may increase the risk of obesity related malignancies.  Lab Results  Component Value Date   HGBA1C 5.2 09/13/2022   Lab Results  Component Value Date   INSULIN 15.4 03/27/2023   INSULIN 15.5 04/05/2022   Lab Results  Component Value Date   GLUCOSE 80 09/13/2022   GLUCOSE 112 (H) 07/14/2012    We reviewed treatment options which include losing 7 to 10% of body weight, increasing volume of physical activity and maintaining a diet low in saturated fats and with a low glycemic load.  Patient has also been educated on the carb insulin model of obesity.  She did not tolerate metformin I feel that she is a good candidate for GLP-1 therapy.

## 2023-06-09 ENCOUNTER — Ambulatory Visit: Payer: Medicaid Other | Admitting: Dietician

## 2023-06-21 ENCOUNTER — Encounter (INDEPENDENT_AMBULATORY_CARE_PROVIDER_SITE_OTHER): Payer: Self-pay | Admitting: Internal Medicine

## 2023-06-21 ENCOUNTER — Telehealth (INDEPENDENT_AMBULATORY_CARE_PROVIDER_SITE_OTHER): Payer: Self-pay

## 2023-06-21 ENCOUNTER — Ambulatory Visit (INDEPENDENT_AMBULATORY_CARE_PROVIDER_SITE_OTHER): Payer: Commercial Managed Care - PPO | Admitting: Internal Medicine

## 2023-06-21 VITALS — BP 129/88 | HR 76 | Temp 98.0°F | Ht 65.0 in | Wt 281.0 lb

## 2023-06-21 DIAGNOSIS — E88819 Insulin resistance, unspecified: Secondary | ICD-10-CM

## 2023-06-21 DIAGNOSIS — G932 Benign intracranial hypertension: Secondary | ICD-10-CM | POA: Diagnosis not present

## 2023-06-21 DIAGNOSIS — E66813 Obesity, class 3: Secondary | ICD-10-CM

## 2023-06-21 DIAGNOSIS — Z6841 Body Mass Index (BMI) 40.0 and over, adult: Secondary | ICD-10-CM | POA: Diagnosis not present

## 2023-06-21 DIAGNOSIS — R638 Other symptoms and signs concerning food and fluid intake: Secondary | ICD-10-CM | POA: Diagnosis not present

## 2023-06-21 MED ORDER — ZEPBOUND 2.5 MG/0.5ML ~~LOC~~ SOAJ: 2.5000 mg | SUBCUTANEOUS | 0 refills | Status: DC

## 2023-06-21 NOTE — Telephone Encounter (Signed)
 PA for Zepbound started

## 2023-06-21 NOTE — Assessment & Plan Note (Signed)
Presents for medical weight management. Affected by obesity with idiopathic intracranial hypertension and abnormal food appetite. In the program for over a year with difficulty losing weight. Last visit, Zepbound was prescribed but requires prior authorization. Current weight loss goal is 1500 calories per day based on metabolism. Discussed the option of weight loss surgery, including sleeve gastrectomy, as a potential treatment if medical management fails.  Discussed potential complications and outcomes, including the experience of patient's mother and friend with weight regain and post-surgical illness. - Follow up with Misty Stanley regarding Zepbound prior authorization - Provide a seven-day meal plan targeting 1500 calories per day - Encourage tracking and journaling food intake - Provide handout on weight loss surgery - Encourage watching educational videos on weight loss surgery on YouTube to learn more - Advise updating insurance information with [facility] - Schedule follow-up appointment in four weeks

## 2023-06-21 NOTE — Assessment & Plan Note (Signed)
 Losing weight will improve headache frequency she will continue with medical weight loss

## 2023-06-21 NOTE — Progress Notes (Signed)
Office: [phone]  /  Fax: [phone]  Weight Summary And Biometrics  Vitals Temp: 98 F (36.7 C) BP: 129/88 Pulse Rate: 76 SpO2: 100 %   Anthropometric Measurements Height: 5\' 5"  (1.651 m) Weight: 281 lb (127.5 kg) BMI (Calculated): 46.76 Weight at Last Visit: 283 lb Weight Lost Since Last Visit: 2 lb Weight Gained Since Last Visit: 0 lb Starting Weight: 276 lb Total Weight Loss (lbs): 0 lb (0 kg) Peak Weight: 283 lb   Body Composition  Body Fat %: 49.4 % Fat Mass (lbs): 139 lbs Muscle Mass (lbs): 135.2 lbs Total Body Water (lbs): 102.2 lbs Visceral Fat Rating : 14    No data recorded Today's Visit #: 17  Starting Date: 04/05/22   Subjective   Chief Complaint: Obesity  Stacy Mills is here to discuss her progress with her obesity treatment plan. She is on the the Category 3 Plan and states she is following her eating plan approximately 90 % of the time. She states she is not exercising.  Weight Progress Since Last Visit: Discussed the use of AI scribe software for clinical note transcription with the patient, who gave verbal consent to proceed.  History of Present Illness   The patient, affected by obesity, idiopathic intracranial hypertension, and abnormal food appetite, has been in our program for over a year and has had difficulty losing weight. She was prescribed Zepbound at the last office visit, but the medication requires a prior authorization from her insurance.  She has been given meal plans in the past and is open to receiving a seven-day meal plan to help her reach her calorie target. The patient has considered gastric bypass surgery as a treatment option for her obesity, but she has concerns due to the experiences of her mother and friend who both had complications post-surgery. She is open to learning more about the procedure and is considering it as a treatment option rather than a last resort.     Challenges affecting patient progress: strong  hunger signals and/or impaired satiety / inhibitory control and low volume of physical activity at present .   Orexigenic Control: Denies problems with appetite and hunger signals.  Denies problems with satiety and satiation.  Denies problems with eating patterns and portion control.  Denies abnormal cravings. Denies feeling deprived or restricted.   Pharmacotherapy for weight management: She is currently taking no anti-obesity medication. Last visit was prescribed Zepbound but PA has not been completed.  Assessment and Plan   Treatment Plan For Obesity:  Recommended Dietary Goals  [place]inamon is currently in the action stage of change. As such, her goal is to continue weight management plan. She has agreed to: continue current plan  Behavioral Health and Counseling  We discussed the following behavioral modification strategies today: continue to work on maintaining a reduced calorie state, getting the recommended amount of protein, incorporating whole foods, making healthy choices, staying well hydrated and practicing mindfulness when eating..  Additional education and resources provided today: None  Recommended Physical Activity Goals  Wileen has been advised to work up to 150 minutes of moderate intensity aerobic activity a week and strengthening exercises 2-3 times per week for cardiovascular health, weight loss maintenance and preservation of muscle mass.   She has agreed to :  Think about enjoyable ways to increase daily physical activity and overcoming barriers to exercise and Increase physical activity in their day and reduce sedentary time (increase NEAT).  Pharmacotherapy  We discussed various medication options to help [facility]  with her weight loss efforts and we both agreed to : start anti-obesity medication.  In addition to reduced calorie nutrition plan (RCNP), behavioral strategies and physical activity, Latorie would benefit from pharmacotherapy to assist with hunger  signals, satiety and cravings. This will reduce obesity-related health risks by inducing weight loss, and help reduce food consumption and adherence to [facility]) . It may also improve QOL by improving self-confidence and reduce the  setbacks associated with metabolic adaptations.  She has tried metformin and topiramate medications were discontinued due to side effects.  After discussion of treatment options, mechanisms of action, benefits, side effects, contraindications and shared decision making she is agreeable to starting Zepbound 2.5 mg once a week. Patient also made aware that medication is indicated for long-term management of obesity and the risk of weight regain following discontinuation of treatment and hence the importance of adhering to medical weight loss plan.  We demonstrated use of device and patient using teach back method was able to demonstrate proper technique.  Associated Conditions Impacted by Obesity Treatment  Benign intracranial hypertension Assessment & Plan: Losing weight will improve headache frequency she will continue with medical weight loss   Class 3 severe obesity with serious comorbidity and body mass index (BMI) of 40.0 to 44.9 in adult, unspecified obesity type [facility]) Assessment & Plan: Presents for medical weight management. Affected by obesity with idiopathic intracranial hypertension and abnormal food appetite. In the program for over a year with difficulty losing weight. Last visit, Zepbound was prescribed but requires prior authorization. Current weight loss goal is 1500 calories per day based on metabolism. Discussed the option of weight loss surgery, including sleeve gastrectomy, as a potential treatment if medical management fails.  Discussed potential complications and outcomes, including the experience of patient's mother and friend with weight regain and post-surgical illness. - Follow up with Misty Stanley regarding Zepbound prior authorization - Provide a seven-day  meal plan targeting 1500 calories per day - Encourage tracking and journaling food intake - Provide handout on weight loss surgery - Encourage watching educational videos on weight loss surgery on YouTube to learn more - Advise updating insurance information with [facility] - Schedule follow-up appointment in four weeks  Orders: -     Zepbound; Inject 2.5 mg into the skin once a week.  Dispense: 2 mL; Refill: 0  Insulin resistance Assessment & Plan: Her HOMA-IR is 3.09 which is elevated. Optimal level < 1.9.   This is complex condition associated with genetics, ectopic fat and lifestyle factors. Insulin resistance may result in increased fat storage, inhibition of the breakdown of fat, cause fluctuations in blood sugar leading to energy crashes and increased cravings for sugary or high carb foods and cause metabolic slowdown making it difficult to lose weight.  This may result in additional weight gain and lead to pre-diabetes and diabetes if untreated. In addition, hyperinsulinemia increases cardiovascular risk, chronic inflammatory response and may increase the risk of obesity related malignancies.  Lab Results  Component Value Date   HGBA1C 5.2 09/13/2022   Lab Results  Component Value Date   INSULIN 15.4 03/27/2023   INSULIN 15.5 04/05/2022   Lab Results  Component Value Date   GLUCOSE 80 09/13/2022   GLUCOSE 112 (H) 07/14/2012    We reviewed treatment options which include losing 7 to 10% of body weight, increasing volume of physical activity and maintaining a diet low in saturated fats and with a low glycemic load.  Patient has also been educated on the carb  insulin model of obesity.  She did not tolerate metformin I feel that she is a good candidate for GLP-1 therapy.  She will be started on Zepbound 2.5 mg once a week.   Orders: -     Zepbound; Inject 2.5 mg into the skin once a week.  Dispense: 2 mL; Refill: 0  Abnormal food appetite Assessment & Plan: Unchanged.  She has increased orexigenic signaling, impaired satiety and inhibitory control. This is secondary to an abnormal energy regulation system and pathological neurohormonal pathways characteristic of excess adiposity.  In addition to nutritional and behavioral strategies she benefits from pharmacotherapy.  She will be prescribed Zepbound 2.5 mg once a week     Assessment and Plan    Obesity   Abnormal Food Appetite Reports abnormal food appetite contributing to difficulty in weight management. Addressed through dietary modifications and meal planning. - Provide a seven-day meal plan targeting 1500 calories per day - Encourage tracking and journaling food intake  Idiopathic Intracranial Hypertension No specific discussion on current symptoms or management in this visit.  General Health Maintenance Discussed the importance of balanced nutrition and consistent weight loss. Encouraged avoiding weight regain. - Follow the provided meal plan for balanced nutrition - Maintain consistent weight loss of 2-3 pounds per follow-up  Follow-up - Schedule follow-up appointment in four weeks - Contact the office if no update on Zepbound authorization within four days - Ensure access to the patient portal for communication.        Objective   Physical Exam:  Blood pressure 129/88, pulse 76, temperature 98 F (36.7 C), height 5\' 5"  (1.651 m), weight 281 lb (127.5 kg), last menstrual period 05/17/2023, SpO2 100%. Body mass index is 46.76 kg/m.  General: She is overweight, cooperative, alert, well developed, and in no acute distress. PSYCH: Has normal mood, affect and thought process.   HEENT: EOMI, sclerae are anicteric. Lungs: Normal breathing effort, no conversational dyspnea. Extremities: No edema.  Neurologic: No gross sensory or motor deficits. No tremors or fasciculations noted.    Diagnostic Data Reviewed:  BMET    Component Value Date/Time   NA 139 09/13/2022 0936   NA 140  04/05/2022 0839   K 3.9 09/13/2022 0936   CL 106 09/13/2022 0936   CO2 26 09/13/2022 0936   GLUCOSE 80 09/13/2022 0936   BUN 16 09/13/2022 0936   BUN 10 04/05/2022 0839   CREATININE 0.86 09/13/2022 0936   CREATININE 0.98 09/23/2021 1440   CREATININE 0.93 11/05/2012 1015   CALCIUM 9.2 09/13/2022 0936   GFRNONAA >60 09/23/2021 1440   GFRAA >60 05/31/2019 0417   Lab Results  Component Value Date   HGBA1C 5.2 09/13/2022   HGBA1C 5.4 05/09/2016   Lab Results  Component Value Date   INSULIN 15.4 03/27/2023   INSULIN 15.5 04/05/2022   Lab Results  Component Value Date   TSH 1.250 04/05/2022   CBC    Component Value Date/Time   WBC 8.7 04/05/2022 0839   WBC 7.2 09/23/2021 1440   WBC 8.1 09/16/2021 0847   RBC 4.65 04/05/2022 0839   RBC 4.01 09/23/2021 1440   HGB 13.8 04/05/2022 0839   HCT 42.1 04/05/2022 0839   PLT 333 04/05/2022 0839   MCV 91 04/05/2022 0839   MCH 29.7 04/05/2022 0839   MCH 29.9 09/23/2021 1440   MCHC 32.8 04/05/2022 0839   MCHC 33.1 09/23/2021 1440   RDW 11.6 (L) 04/05/2022 0839   Iron Studies No results found for: "IRON", "TIBC", "FERRITIN", "  IRONPCTSAT" Lipid Panel     Component Value Date/Time   CHOL 154 03/27/2023 0918   TRIG 55 03/27/2023 0918   HDL 60 03/27/2023 0918   CHOLHDL 3 01/26/2021 1421   VLDL 9.0 01/26/2021 1421   LDLCALC 83 03/27/2023 0918   Hepatic Function Panel     Component Value Date/Time   PROT 7.0 09/13/2022 0936   PROT 7.2 04/05/2022 0839   ALBUMIN 4.1 09/13/2022 0936   ALBUMIN 4.4 04/05/2022 0839   AST 14 09/13/2022 0936   AST 15 09/23/2021 1440   ALT 13 09/13/2022 0936   ALT 12 09/23/2021 1440   ALKPHOS 53 09/13/2022 0936   BILITOT 0.3 09/13/2022 0936   BILITOT <0.2 04/05/2022 0839   BILITOT 0.4 09/23/2021 1440      Component Value Date/Time   TSH 1.250 04/05/2022 0839   Nutritional Lab Results  Component Value Date   VD25OH 19.8 (L) 03/27/2023   VD25OH 21.7 (L) 07/28/2022   VD25OH 11.1 (L)  04/05/2022    Follow-Up   Return in about 4 weeks (around 07/19/2023) for For Weight Mangement with Dr. Rikki Spearing.Marland Kitchen She was informed of the importance of frequent follow up visits to maximize her success with intensive lifestyle modifications for her multiple health conditions.  Attestation Statement   Reviewed by clinician on day of visit: allergies, medications, problem list, medical history, surgical history, family history, social history, and previous encounter notes.     Worthy Rancher, MD

## 2023-06-21 NOTE — Assessment & Plan Note (Signed)
Unchanged. She has increased orexigenic signaling, impaired satiety and inhibitory control. This is secondary to an abnormal energy regulation system and pathological neurohormonal pathways characteristic of excess adiposity.  In addition to nutritional and behavioral strategies she benefits from pharmacotherapy.  She will be prescribed Zepbound 2.5 mg once a week

## 2023-06-21 NOTE — Assessment & Plan Note (Signed)
Her HOMA-IR is 3.09 which is elevated. Optimal level < 1.9.   This is complex condition associated with genetics, ectopic fat and lifestyle factors. Insulin resistance may result in increased fat storage, inhibition of the breakdown of fat, cause fluctuations in blood sugar leading to energy crashes and increased cravings for sugary or high carb foods and cause metabolic slowdown making it difficult to lose weight.  This may result in additional weight gain and lead to pre-diabetes and diabetes if untreated. In addition, hyperinsulinemia increases cardiovascular risk, chronic inflammatory response and may increase the risk of obesity related malignancies.  Lab Results  Component Value Date   HGBA1C 5.2 09/13/2022   Lab Results  Component Value Date   INSULIN 15.4 03/27/2023   INSULIN 15.5 04/05/2022   Lab Results  Component Value Date   GLUCOSE 80 09/13/2022   GLUCOSE 112 (H) 07/14/2012    We reviewed treatment options which include losing 7 to 10% of body weight, increasing volume of physical activity and maintaining a diet low in saturated fats and with a low glycemic load.  Patient has also been educated on the carb insulin model of obesity.  She did not tolerate metformin I feel that she is a good candidate for GLP-1 therapy.  She will be started on Zepbound 2.5 mg once a week.

## 2023-06-22 ENCOUNTER — Encounter (INDEPENDENT_AMBULATORY_CARE_PROVIDER_SITE_OTHER): Payer: Self-pay | Admitting: Internal Medicine

## 2023-06-22 ENCOUNTER — Telehealth (INDEPENDENT_AMBULATORY_CARE_PROVIDER_SITE_OTHER): Payer: Self-pay

## 2023-06-22 ENCOUNTER — Ambulatory Visit (INDEPENDENT_AMBULATORY_CARE_PROVIDER_SITE_OTHER): Payer: Medicaid Other | Admitting: Internal Medicine

## 2023-06-22 MED ORDER — WEGOVY 0.25 MG/0.5ML ~~LOC~~ SOAJ: 0.2500 mg | SUBCUTANEOUS | 0 refills | Status: DC

## 2023-06-22 NOTE — Telephone Encounter (Signed)
Zepbound denied, needs to fail [facility], pt advised and [facility] sent in

## 2023-06-22 NOTE — Addendum Note (Signed)
Addended by: Paulla Fore A on: 06/22/2023 08:06 AM   Modules accepted: Orders

## 2023-07-01 ENCOUNTER — Encounter (INDEPENDENT_AMBULATORY_CARE_PROVIDER_SITE_OTHER): Payer: Self-pay | Admitting: Internal Medicine

## 2023-07-04 NOTE — Telephone Encounter (Signed)
Zepbound denied, pt notified

## 2023-07-14 ENCOUNTER — Encounter: Payer: Self-pay | Admitting: Family Medicine

## 2023-07-14 ENCOUNTER — Other Ambulatory Visit (HOSPITAL_COMMUNITY)
Admission: RE | Admit: 2023-07-14 | Discharge: 2023-07-14 | Disposition: A | Discharge status: Home / Self Care | Payer: Commercial Managed Care - PPO | Source: Ambulatory Visit | Attending: Family Medicine | Admitting: Family Medicine

## 2023-07-14 ENCOUNTER — Ambulatory Visit: Payer: Commercial Managed Care - PPO | Admitting: Family Medicine

## 2023-07-14 VITALS — BP 124/78 | HR 97 | Temp 97.7°F | Ht 65.0 in | Wt 289.6 lb

## 2023-07-14 DIAGNOSIS — Z202 Contact with and (suspected) exposure to infections with a predominantly sexual mode of transmission: Secondary | ICD-10-CM | POA: Insufficient documentation

## 2023-07-14 NOTE — Progress Notes (Signed)
New Patient Office Visit  Subjective   Patient ID: Stacy Mills, female    DOB: Oct 20, 1995  Age: 28 y.o. MRN: [id number]  CC:  Chief Complaint  Patient presents with   Exposure to STD    Per pt she was exposed to Chlamydia was aware yesterday. Asymptomatic. Marland Kitchen     HPI Stacy Mills presents to establish care. Encounter Diagnoses  Name Primary?   Exposure to sexually transmitted disease (STD) Yes   Found out yesterday that a recent sexual partner was d/w with chlamydia. Patient is having no symptoms.  She denies dysuria, frequency, discharge abdominal pain  Outpatient Encounter Medications as of 07/14/2023  Medication Sig   acetaminophen (TYLENOL) 325 MG tablet Take 650 mg by mouth every 6 (six) hours as needed for mild pain or moderate pain.   cetirizine (ZYRTEC) 10 MG tablet TAKE 1 TABLET BY MOUTH EVERYDAY AT BEDTIME   Cholecalciferol (VITAMIN D3) 25 MCG (1000 UT) CAPS Take 1 capsule (1,000 Units total) by mouth daily.   metroNIDAZOLE (METROGEL) 0.75 % vaginal gel Place 1 Applicatorful vaginally at bedtime.   Semaglutide-Weight Management (WEGOVY) 0.25 MG/0.5ML SOAJ Inject 0.25 mg into the skin once a week. (Patient not taking: Reported on 07/14/2023)   No facility-administered encounter medications on file as of 07/14/2023.    Past Medical History:  Diagnosis Date   Allergy    Anxiety    Back pain    Depression    Gallbladder problem    HSV (herpes simplex virus) anogenital infection    IIH (idiopathic intracranial hypertension)    IUD (intrauterine device) in place    Paraguard placed 2014, removed 10/2015   Migraine    Pelvic inflammation in female    Pelvic inflammatory disease 10/2012    Past Surgical History:  Procedure Laterality Date   CESAREAN SECTION N/A 08/19/2018   Procedure: CESAREAN SECTION;  Surgeon: Jaymes Graff, MD;  Location: [facility];  Service: Obstetrics;  Laterality: N/A;   CHOLECYSTECTOMY     spinal tap      Family History  Problem  Relation Age of Onset   Asthma Mother    High blood pressure Mother    Obesity Mother    Varicose Veins Mother    Hypertension Father    Alcoholism Father    Drug abuse Father    Alcohol abuse Father    Emphysema Maternal Grandfather    Depression Maternal Grandfather    Anxiety disorder Maternal Grandfather    Mental illness Maternal Grandfather    Diabetes Maternal Grandfather    COPD Maternal Grandfather    Cancer Paternal Grandmother    Non-Hodgkin's lymphoma Paternal Grandmother    Sudden death Paternal Grandmother    Stacy death Paternal Grandmother    Alcohol abuse Maternal Aunt    Hyperlipidemia Maternal Aunt    Anxiety disorder Sister    Anxiety disorder Son    Asthma Son     Social History   Socioeconomic History   Marital status: Single    Spouse name: n/a   Number of children: 0   Years of education: 12+   Highest education level: Some college, no degree  Occupational History   Occupation: Archivist   Occupation: PCA  Tobacco Use   Smoking status: Former   Smokeless tobacco: Never  Advertising account planner   Vaping status: Never Used  Substance and Sexual Activity   Alcohol use: Yes    Alcohol/week: 2.0 standard drinks of alcohol    Types: 2  Shots of liquor per week    Comment: social   Drug use: Not Currently    Types: Marijuana   Sexual activity: Yes    Partners: Male    Birth control/protection: Condom, I.U.D.    Comment: intercourse age 9, more than 5 sexual partners  Other Topics Concern   Not on file  Social History Narrative   Graduated from high school 10/2012, currently attending college.   Right-handed   Caffeine: 3x/wk   Social Drivers of Health   Financial Resource Strain: Medium Risk (03/28/2023)   Overall Financial Resource Strain (CARDIA)    Difficulty of Paying Living Expenses: Somewhat hard  Food Insecurity: Food Insecurity Present (03/28/2023)   Hunger Vital Sign    Worried About Running Out of Food in the Last Year: Sometimes  true    Ran Out of Food in the Last Year: Never true  Transportation Needs: No Transportation Needs (03/28/2023)   PRAPARE - Administrator, Civil Service (Medical): No    Lack of Transportation (Non-Medical): No  Physical Activity: Sufficiently Active (03/28/2023)   Exercise Vital Sign    Days of Exercise per Week: 3 days    Minutes of Exercise per Session: 60 min  Stress: No Stress Concern Present (03/28/2023)   [employer] of Occupational Health - Occupational Stress Questionnaire    Feeling of Stress : Only a little  Social Connections: Socially Isolated (03/28/2023)   Social Connection and Isolation Panel [NHANES]    Frequency of Communication with Friends and Family: More than three times a week    Frequency of Social Gatherings with Friends and Family: More than three times a week    Attends Religious Services: Never    Database administrator or Organizations: No    Attends Engineer, structural: Not on file    Marital Status: Never married  Intimate Partner Violence: Unknown (10/19/2021)   Received from [employer], [facility]   HITS    Physically Hurt: Not on file    Insult or Talk Down To: Not on file    Threaten Physical Harm: Not on file    Scream or Curse: Not on file    Review of Systems  Constitutional: Negative.   HENT: Negative.    Eyes:  Negative for blurred vision, discharge and redness.  Respiratory: Negative.    Cardiovascular: Negative.   Gastrointestinal:  Negative for abdominal pain.  Genitourinary: Negative.  Negative for dysuria, frequency and urgency.  Musculoskeletal: Negative.  Negative for myalgias.  Skin:  Negative for rash.  Neurological:  Negative for tingling, loss of consciousness and weakness.  Endo/Heme/Allergies:  Negative for polydipsia.        Objective   BP 124/78   Pulse 97   Temp 97.7 F (36.5 C)   Ht 5\' 5"  (1.651 m)   Wt 289 lb 9.6 oz (131.4 kg)   LMP 05/17/2023   SpO2 97%   BMI 48.19  kg/m   Physical Exam Constitutional:      General: She is not in acute distress.    Appearance: Normal appearance. She is not ill-appearing, toxic-appearing or diaphoretic.  HENT:     Head: Normocephalic and atraumatic.     Right Ear: External ear normal.     Left Ear: External ear normal.  Eyes:     General: No scleral icterus.       Right eye: No discharge.        Left eye: No discharge.  Extraocular Movements: Extraocular movements intact.     Conjunctiva/sclera: Conjunctivae normal.  Pulmonary:     Effort: Pulmonary effort is normal. No respiratory distress.  Skin:    General: Skin is warm and dry.  Neurological:     Mental Status: She is alert and oriented to person, place, and time.  Psychiatric:        Mood and Affect: Mood normal.        Behavior: Behavior normal.         Assessment & Plan:   Exposure to sexually transmitted disease (STD) -     HIV Antibody (routine testing w rflx) -     RPR -     Urine cytology ancillary only     Return if symptoms worsen or fail to improve.  Info given on preventing STD. Treatment pends results.  Recommended treatments pending results of above.  Mliss Sax, MD

## 2023-07-15 LAB — HIV ANTIBODY (ROUTINE TESTING W REFLEX): HIV 1&2 Ab, 4th Generation: NONREACTIVE

## 2023-07-15 LAB — RPR: RPR Ser Ql: NONREACTIVE

## 2023-07-17 ENCOUNTER — Encounter: Payer: Self-pay | Admitting: Family Medicine

## 2023-07-17 ENCOUNTER — Encounter: Payer: Self-pay | Admitting: Nurse Practitioner

## 2023-07-17 DIAGNOSIS — Z202 Contact with and (suspected) exposure to infections with a predominantly sexual mode of transmission: Secondary | ICD-10-CM

## 2023-07-17 LAB — URINE CYTOLOGY ANCILLARY ONLY
Chlamydia: NEGATIVE
Comment: NEGATIVE
Comment: NORMAL
Neisseria Gonorrhea: NEGATIVE

## 2023-07-17 MED ORDER — METRONIDAZOLE 1 % EX GEL: Freq: Every day | CUTANEOUS | 0 refills | Status: AC

## 2023-07-19 ENCOUNTER — Ambulatory Visit: Payer: Commercial Managed Care - PPO | Admitting: Nurse Practitioner

## 2023-07-19 ENCOUNTER — Encounter (INDEPENDENT_AMBULATORY_CARE_PROVIDER_SITE_OTHER): Payer: Self-pay | Admitting: Internal Medicine

## 2023-07-19 ENCOUNTER — Ambulatory Visit (INDEPENDENT_AMBULATORY_CARE_PROVIDER_SITE_OTHER): Payer: Commercial Managed Care - PPO | Admitting: Internal Medicine

## 2023-07-19 VITALS — BP 124/82 | HR 88 | Temp 98.3°F | Ht 65.0 in | Wt 284.0 lb

## 2023-07-19 DIAGNOSIS — G932 Benign intracranial hypertension: Secondary | ICD-10-CM | POA: Diagnosis not present

## 2023-07-19 DIAGNOSIS — E66813 Obesity, class 3: Secondary | ICD-10-CM

## 2023-07-19 DIAGNOSIS — Z6841 Body Mass Index (BMI) 40.0 and over, adult: Secondary | ICD-10-CM | POA: Diagnosis not present

## 2023-07-19 DIAGNOSIS — E88819 Insulin resistance, unspecified: Secondary | ICD-10-CM

## 2023-07-19 MED ORDER — WEGOVY 0.25 MG/0.5ML ~~LOC~~ SOAJ: 0.2500 mg | SUBCUTANEOUS | 0 refills | Status: DC

## 2023-07-19 NOTE — Progress Notes (Addendum)
 Office: [phone]  /  Fax: [phone]  Weight Summary And Biometrics  Vitals Temp: 98.3 F (36.8 C) BP: 124/82 Pulse Rate: 88 SpO2: 99 %   Anthropometric Measurements Height: 5\' 5"  (1.651 m) Weight: 284 lb (128.8 kg) BMI (Calculated): 47.26 Weight at Last Visit: 281 lb Weight Lost Since Last Visit: 0 lb Weight Gained Since Last Visit: 3 lb Starting Weight: 276 lb Total Weight Loss (lbs): 0 lb (0 kg) Peak Weight: 283 lb   Body Composition  Body Fat %: 49.5 % Fat Mass (lbs): 141 lbs Muscle Mass (lbs): 136.6 lbs Total Body Water (lbs): 103 lbs Visceral Fat Rating : 15    No data recorded Today's Visit #: 17  Starting Date: 04/05/22   Subjective   Chief Complaint: Obesity  Stacy Mills is here to discuss her progress with her obesity treatment plan. She is on the the Category 1 Plan and states she is following her eating plan approximately 90 % of the time. She states she is not exercising..  Weight Progress Since Last Visit:  Discussed the use of AI scribe software for clinical note transcription with the patient, who gave verbal consent to proceed.  History of Present Illness   Stacy Mills is a 28 year old female who presents for medical weight management.  She has been coming to our clinic since November 2023 and has had difficulty losing weight.  She is considering medical weight management options, including gastric bypass surgery, due to a high BMI of 47 and associated comorbidities including benign intracranial hypertension and insulin resistance.  Despite previous attempts with various strategies, she has not achieved significant or sustained weight loss.  She has tried multiple weight loss strategies including several reduced calorie nutrition plans, meal replacements, pharmacotherapy with metformin, topiramate.  These medications were discontinued due to side effects.  She has not used phentermine and her insurance recently denied GLP-1 therapy.     She has also been referred to a registered dietitian and had seen them once but missed her subsequent appointment.  She has Medicaid and [employer], which previously denied coverage for weight loss injections. She is uncertain if her insurance will cover gastric bypass surgery and plans to investigate this further.  She recalls being referred to a dietician in the past but does not remember the specific advice given. She missed a recent appointment with the dietician due to inclement weather.      Challenges affecting patient progress: cost of medication, strong hunger signals and/or impaired satiety / inhibitory control, having difficulty with meal prep and planning, need for convenience or prepackaged foods, and low volume of physical activity at present .   Orexigenic Control: Reports problems with appetite and hunger signals.  Denies problems with satiety and satiation.  Denies problems with eating patterns and portion control.  Denies abnormal cravings. Denies feeling deprived or restricted.   Pharmacotherapy for weight management: She is currently taking no anti-obesity medication and was prescribed Wegovy but was denied by insurance recently .   Assessment and Plan   Treatment Plan For Obesity:  Recommended Dietary Goals  Stacy Mills is currently in the action stage of change. As such, her goal is to continue weight management plan. She has agreed to: continue current plan  Behavioral Health and Counseling  We discussed the following behavioral modification strategies today: continue to work on maintaining a reduced calorie state, getting the recommended amount of protein, incorporating whole foods, making healthy choices, staying well hydrated and practicing mindfulness when  eating..  Additional education and resources provided today: None  Recommended Physical Activity Goals  Stacy Mills has been advised to work up to 150 minutes of moderate intensity aerobic activity a week  and strengthening exercises 2-3 times per week for cardiovascular health, weight loss maintenance and preservation of muscle mass.   She has agreed to :  Think about enjoyable ways to increase daily physical activity and overcoming barriers to exercise and Increase physical activity in their day and reduce sedentary time (increase NEAT).  Pharmacotherapy  We discussed various medication options to help Stacy Mills with her weight loss efforts and we both agreed to : start anti-obesity medication.  In addition to reduced calorie nutrition plan (RCNP), behavioral strategies and physical activity, Stacy Mills would benefit from pharmacotherapy to assist with hunger signals, satiety and cravings. This will reduce obesity-related health risks by inducing weight loss, and help reduce food consumption and adherence to [facility]) . It may also improve QOL by improving self-confidence and reduce the  setbacks associated with metabolic adaptations.  After discussion of treatment options, mechanisms of action, benefits, side effects, contraindications and shared decision making she is agreeable to starting Wegovy 0.25 mg once a week. Patient also made aware that medication is indicated for long-term management of obesity and the risk of weight regain following discontinuation of treatment and hence the importance of adhering to medical weight loss plan.  We demonstrated use of device and patient using teach back method was able to demonstrate proper technique.   Associated Conditions Impacted by Obesity Treatment  Insulin resistance  Class 3 severe obesity with serious comorbidity and body mass index (BMI) of 40.0 to 44.9 in adult, unspecified obesity type (HCC) -     ZOXWRU; Inject 0.25 mg into the skin once a week.  Dispense: 2 mL; Refill: 0  Idiopathic intracranial hypertension     Assessment & Plan Class 3 severe obesity with serious comorbidity and body mass index (BMI) of 40.0 to 44.9 in adult, unspecified  obesity type (HCC)  Insulin resistance  Idiopathic intracranial hypertension      Plan:  -Resubmit prescription for [facility] I feel that patient would benefit from GLP-1 for the management of her obesity and related comorbidities listed above.  Staff will look into coverage -We reviewed surgical treatment options she will look and see if her insurance covers procedure. -We discussed that she needs to have a good foundation and permanent nutritional and behavioral strategies to ensure success with surgical management. -She has been with our program for over a year and has struggled losing weight via current weight management strategy involving reduced calorie nutrition plan. -We will repeat her indirect calorimetry, and reset and calibrate her meal plan. -She has not followed up with registered dietitian -If her insurance does not cover GLP-1, we may consider use of sympathomimetic if no contraindications exist.   Objective   Physical Exam:  Blood pressure 124/82, pulse 88, temperature 98.3 F (36.8 C), height 5\' 5"  (1.651 m), weight 284 lb (128.8 kg), last menstrual period 05/17/2023, SpO2 99%. Body mass index is 47.26 kg/m.  General: She is overweight, cooperative, alert, well developed, and in no acute distress. PSYCH: Has normal mood, affect and thought process.   HEENT: EOMI, sclerae are anicteric. Lungs: Normal breathing effort, no conversational dyspnea. Extremities: No edema.  Neurologic: No gross sensory or motor deficits. No tremors or fasciculations noted.    Diagnostic Data Reviewed:  BMET    Component Value Date/Time   NA 139 09/13/2022 0936  NA 140 04/05/2022 0839   K 3.9 09/13/2022 0936   CL 106 09/13/2022 0936   CO2 26 09/13/2022 0936   GLUCOSE 80 09/13/2022 0936   BUN 16 09/13/2022 0936   BUN 10 04/05/2022 0839   CREATININE 0.86 09/13/2022 0936   CREATININE 0.98 09/23/2021 1440   CREATININE 0.93 11/05/2012 1015   CALCIUM 9.2 09/13/2022 0936    GFRNONAA >60 09/23/2021 1440   GFRAA >60 05/31/2019 0417   Lab Results  Component Value Date   HGBA1C 5.2 09/13/2022   HGBA1C 5.4 05/09/2016   Lab Results  Component Value Date   INSULIN 15.4 03/27/2023   INSULIN 15.5 04/05/2022   Lab Results  Component Value Date   TSH 1.250 04/05/2022   CBC    Component Value Date/Time   WBC 8.7 04/05/2022 0839   WBC 7.2 09/23/2021 1440   WBC 8.1 09/16/2021 0847   RBC 4.65 04/05/2022 0839   RBC 4.01 09/23/2021 1440   HGB 13.8 04/05/2022 0839   HCT 42.1 04/05/2022 0839   PLT 333 04/05/2022 0839   MCV 91 04/05/2022 0839   MCH 29.7 04/05/2022 0839   MCH 29.9 09/23/2021 1440   MCHC 32.8 04/05/2022 0839   MCHC 33.1 09/23/2021 1440   RDW 11.6 (L) 04/05/2022 0839   Iron Studies No results found for: "IRON", "TIBC", "FERRITIN", "IRONPCTSAT" Lipid Panel     Component Value Date/Time   CHOL 154 03/27/2023 0918   TRIG 55 03/27/2023 0918   HDL 60 03/27/2023 0918   CHOLHDL 3 01/26/2021 1421   VLDL 9.0 01/26/2021 1421   LDLCALC 83 03/27/2023 0918   Hepatic Function Panel     Component Value Date/Time   PROT 7.0 09/13/2022 0936   PROT 7.2 04/05/2022 0839   ALBUMIN 4.1 09/13/2022 0936   ALBUMIN 4.4 04/05/2022 0839   AST 14 09/13/2022 0936   AST 15 09/23/2021 1440   ALT 13 09/13/2022 0936   ALT 12 09/23/2021 1440   ALKPHOS 53 09/13/2022 0936   BILITOT 0.3 09/13/2022 0936   BILITOT <0.2 04/05/2022 0839   BILITOT 0.4 09/23/2021 1440      Component Value Date/Time   TSH 1.250 04/05/2022 0839   Nutritional Lab Results  Component Value Date   VD25OH 19.8 (L) 03/27/2023   VD25OH 21.7 (L) 07/28/2022   VD25OH 11.1 (L) 04/05/2022    Follow-Up   Return in about 4 weeks (around 08/16/2023) for Fasting and 30 minutes early for IC and 40 minutes for reset.Marland Kitchen She was informed of the importance of frequent follow up visits to maximize her success with intensive lifestyle modifications for her multiple health conditions.  Attestation  Statement   Reviewed by clinician on day of visit: allergies, medications, problem list, medical history, surgical history, family history, social history, and previous encounter notes.   I have spent 40 minutes in the care of the patient today including: preparing to see patient (e.g. review and interpretation of tests, old notes ), obtaining and/or reviewing separately obtained history, counseling and educating the patient, ordering medications, test or procedures, documenting clinical information in the electronic or other health care record, and independently interpreting results and communicating results to the patient, family, or caregiver   Worthy Rancher, MD

## 2023-07-20 ENCOUNTER — Other Ambulatory Visit (HOSPITAL_COMMUNITY)
Admission: RE | Admit: 2023-07-20 | Discharge: 2023-07-20 | Disposition: A | Discharge status: Home / Self Care | Payer: Commercial Managed Care - PPO | Source: Ambulatory Visit | Attending: Internal Medicine | Admitting: Internal Medicine

## 2023-07-20 ENCOUNTER — Encounter (INDEPENDENT_AMBULATORY_CARE_PROVIDER_SITE_OTHER): Payer: Self-pay

## 2023-07-20 DIAGNOSIS — N898 Other specified noninflammatory disorders of vagina: Secondary | ICD-10-CM | POA: Diagnosis not present

## 2023-07-20 NOTE — Progress Notes (Unsigned)
 [facility] [address] [place] [address] Dept: [phone] Dept Fax: [phone]  Acute Care Office Visit  Subjective:   Stacy Mills 06-13-1995 07/21/2023  No chief complaint on file.   HPI: Discussed the use of AI scribe software for clinical note transcription with the patient, who gave verbal consent to proceed.  History of Present Illness             The following portions of the patient's history were reviewed and updated as appropriate: past medical history, past surgical history, family history, social history, allergies, medications, and problem list.   Patient Active Problem List   Diagnosis Date Noted   Primary insomnia 03/12/2023   Idiopathic intracranial hypertension 01/12/2023   Obesity- Start BMI 44.43 01/12/2023   Abnormal food appetite 07/28/2022   Vitamin D deficiency 06/23/2022   Insulin resistance 06/23/2022   Family history of non-Hodgkin's lymphoma 09/09/2021   Lymphadenopathy 09/09/2021   Lower abdominal pain 08/31/2021   Acanthosis nigricans 01/26/2021   Liver abscess 05/30/2019   Genital herpes simplex 06/22/2018   Benign intracranial hypertension 05/19/2018   Migraines 12/22/2014   Hemorrhagic cyst of ovary 11/07/2012   Class 3 severe obesity with serious comorbidity and body mass index (BMI) of 40.0 to 44.9 in adult [facility]) 08/26/2012   Past Medical History:  Diagnosis Date   Allergy    Anxiety    Back pain    Depression    Gallbladder problem    HSV (herpes simplex virus) anogenital infection    IIH (idiopathic intracranial hypertension)    IUD (intrauterine device) in place    Paraguard placed 2014, removed 10/2015   Migraine    Pelvic inflammation in female    Pelvic inflammatory disease 10/2012   Past Surgical History:  Procedure Laterality Date   CESAREAN SECTION N/A 08/19/2018   Procedure: CESAREAN SECTION;  Surgeon: Jaymes Graff, MD;  Location: [facility];   Service: Obstetrics;  Laterality: N/A;   CHOLECYSTECTOMY     spinal tap     Family History  Problem Relation Age of Onset   Asthma Mother    High blood pressure Mother    Obesity Mother    Varicose Veins Mother    Hypertension Father    Alcoholism Father    Drug abuse Father    Alcohol abuse Father    Emphysema Maternal Grandfather    Depression Maternal Grandfather    Anxiety disorder Maternal Grandfather    Mental illness Maternal Grandfather    Diabetes Maternal Grandfather    COPD Maternal Grandfather    Cancer Paternal Grandmother    Non-Hodgkin's lymphoma Paternal Grandmother    Sudden death Paternal Grandmother    Early death Paternal Grandmother    Alcohol abuse Maternal Aunt    Hyperlipidemia Maternal Aunt    Anxiety disorder Sister    Anxiety disorder Son    Asthma Son     Current Outpatient Medications:    acetaminophen (TYLENOL) 325 MG tablet, Take 650 mg by mouth every 6 (six) hours as needed for mild pain or moderate pain., Disp: , Rfl:    cetirizine (ZYRTEC) 10 MG tablet, TAKE 1 TABLET BY MOUTH EVERYDAY AT BEDTIME, Disp: 90 tablet, Rfl: 1   Cholecalciferol (VITAMIN D3) 25 MCG (1000 UT) CAPS, Take 1 capsule (1,000 Units total) by mouth daily., Disp: , Rfl:    metroNIDAZOLE (METROGEL) 0.75 % vaginal gel, Place 1 Applicatorful vaginally at bedtime., Disp: 70 g, Rfl: 0  metroNIDAZOLE (METROGEL) 1 % gel, Apply topically daily for 5 days., Disp: 45 g, Rfl: 0   Semaglutide-Weight Management (WEGOVY) 0.25 MG/0.5ML SOAJ, Inject 0.25 mg into the skin once a week., Disp: 2 mL, Rfl: 0 Allergies  Allergen Reactions   Ivp Dye [Iodinated Contrast Media] Itching and Nausea Only    Contrast for MRI     ROS: A complete ROS was performed with pertinent positives/negatives noted in the HPI. The remainder of the ROS are negative.    Objective:   There were no vitals filed for this visit.  GENERAL: Well-appearing, in NAD. Well nourished.  SKIN: Pink, warm and dry. No  rash, lesion, ulceration, or ecchymoses.  NECK: Trachea midline. Full ROM w/o pain or tenderness. No lymphadenopathy.  RESPIRATORY: Chest wall symmetrical. Respirations even and non-labored. Breath sounds clear to auscultation bilaterally.  CARDIAC: S1, S2 present, regular rate and rhythm. Peripheral pulses 2+ bilaterally.  MSK: Muscle tone and strength appropriate for age. Joints w/o tenderness, redness, or swelling. EXTREMITIES: Without clubbing, cyanosis, or edema.  NEUROLOGIC: No motor or sensory deficits. Steady, even gait.  PSYCH/MENTAL STATUS: Alert, oriented x 3. Cooperative, appropriate mood and affect.    No results found for any visits on 07/21/23.    Assessment & Plan:  Assessment and Plan               Screening for STD (sexually transmitted disease)   No orders of the defined types were placed in this encounter.  No orders of the defined types were placed in this encounter.  Lab Orders  No laboratory test(s) ordered today   No images are attached to the encounter or orders placed in the encounter.  No follow-ups on file.   Salvatore Decent, FNP

## 2023-07-21 ENCOUNTER — Ambulatory Visit: Payer: Commercial Managed Care - PPO | Admitting: Internal Medicine

## 2023-07-21 VITALS — BP 110/80 | HR 86 | Temp 98.5°F | Ht 65.0 in | Wt 289.6 lb

## 2023-07-21 DIAGNOSIS — R252 Cramp and spasm: Secondary | ICD-10-CM | POA: Diagnosis not present

## 2023-07-21 DIAGNOSIS — N898 Other specified noninflammatory disorders of vagina: Secondary | ICD-10-CM | POA: Diagnosis not present

## 2023-07-21 DIAGNOSIS — Z113 Encounter for screening for infections with a predominantly sexual mode of transmission: Secondary | ICD-10-CM

## 2023-07-24 ENCOUNTER — Encounter: Payer: Self-pay | Admitting: Internal Medicine

## 2023-07-24 ENCOUNTER — Ambulatory Visit: Payer: Commercial Managed Care - PPO | Admitting: Internal Medicine

## 2023-07-24 LAB — CERVICOVAGINAL ANCILLARY ONLY
Bacterial Vaginitis (gardnerella): POSITIVE — AB
Candida Glabrata: NEGATIVE
Candida Vaginitis: NEGATIVE
Chlamydia: NEGATIVE
Comment: NEGATIVE
Comment: NEGATIVE
Comment: NEGATIVE
Comment: NEGATIVE
Comment: NEGATIVE
Comment: NORMAL
Neisseria Gonorrhea: NEGATIVE
Trichomonas: NEGATIVE

## 2023-08-14 ENCOUNTER — Ambulatory Visit (INDEPENDENT_AMBULATORY_CARE_PROVIDER_SITE_OTHER): Admitting: Nurse Practitioner

## 2023-08-14 ENCOUNTER — Encounter: Payer: Self-pay | Admitting: Nurse Practitioner

## 2023-08-14 VITALS — BP 126/78 | HR 92 | Temp 98.6°F | Ht 65.0 in | Wt 293.4 lb

## 2023-08-14 DIAGNOSIS — G932 Benign intracranial hypertension: Secondary | ICD-10-CM

## 2023-08-14 DIAGNOSIS — M65939 Unspecified synovitis and tenosynovitis, unspecified forearm: Secondary | ICD-10-CM

## 2023-08-14 MED ORDER — NAPROXEN 500 MG PO TABS: 500.0000 mg | ORAL_TABLET | Freq: Two times a day (BID) | ORAL | 0 refills | Status: DC

## 2023-08-14 NOTE — Patient Instructions (Signed)
 Use wrist brace with spicca splint daily Apply cold compress 2-3x/day. Start naproxen as prescribed Have work accomodation form sent to me: use of wrist brace daily. avoid lifting, pulling and pushing x 2weeks.  Wrist Pain, Adult There are many things that can cause wrist pain. Some common causes include: An injury to the wrist area, such as a sprain, strain, or broken bone (fracture). Overuse of the joint. A condition that causes increased pressure on a nerve in the wrist (carpal tunnel syndrome). Wear and tear of the joints that occurs with aging (osteoarthritis). Other types of joint inflammation and stiffness (arthritis). Sometimes, the cause of wrist pain is not known. Often, the pain goes away when you follow instructions from your health care provider for relieving pain at home. This may include resting the wrist, icing the wrist, or using a splint or an elastic wrap for a short time. If your wrist pain continues, it is important to tell your provider. Follow these instructions at home: If you have a removable splint or elastic wrap: Wear the splint or wrap as told by your provider. Remove it only as told by your provider. Ask your provider if you may remove it for bathing. Check the skin around the splint or wrap every day. Tell your provider about any concerns. Loosen the splint or wrap if your fingers tingle, become numb, or turn cold and blue. Keep the splint or wrap clean. If the splint or wrap is not waterproof: Do not let it get wet. Cover it with a watertight covering when you take a bath or shower. Managing pain, stiffness, and swelling  If told, put ice on the painful area. If you have a removable splint or wrap, remove it as told by your provider. Put ice in a plastic bag. Place a towel between your skin and the bag or between your splint or wrap and the bag. Leave the ice on for 20 minutes, 2-3 times a day. If your skin turns bright red, remove the ice right away to  prevent skin damage. The risk of damage is higher if you cannot feel pain, heat, or cold. Move your fingers often to reduce stiffness and swelling. Raise (elevate) the injured area above the level of your heart while you are sitting or lying down. Activity Rest your affected wrist as told by your provider. Return to your normal activities as told by your provider. Ask your provider what activities are safe for you. Ask your provider when it is safe to drive if you have a splint or wrap on your wrist. Do exercises as told by your provider. General instructions Pay attention to any changes in your symptoms. Take over-the-counter and prescription medicines only as told by your provider. Contact a health care provider if: You have a sudden, sharp pain in the wrist, hand, or arm that is different or new. Any swelling or bruising on your wrist or hand gets worse. Your skin becomes red, has a rash, or has open sores. Your pain does not get better or it gets worse. You have a fever or chills. Get help right away if: You lose feeling in your fingers or hand. Your fingers turn white, very red, or cold and blue. You cannot move your fingers. This information is not intended to replace advice given to you by your health care provider. Make sure you discuss any questions you have with your health care provider. Document Revised: 02/18/2022 Document Reviewed: 02/18/2022 Elsevier Patient Education  2024 [employer].

## 2023-08-14 NOTE — Progress Notes (Addendum)
 Established Patient Visit  Patient: Stacy Mills   DOB: 03/06/1996   28 y.o. Female  MRN: [id number] Visit Date: 08/14/2023  Subjective:    Chief Complaint  Patient presents with   Thumb pain     Right thumb pain worse when using like gripping or pull/push   Wrist Pain  The pain is present in the right wrist (and right thumb). This is a new problem. The current episode started in the past 7 days. There has been no history of extremity trauma. The problem occurs constantly. The problem has been gradually worsening. The quality of the pain is described as aching (and numbness). Associated symptoms include an inability to bear weight and numbness. Pertinent negatives include no fever, itching, joint locking, joint swelling, limited range of motion, stiffness or tingling. The symptoms are aggravated by activity. She has tried nothing for the symptoms. Family history does not include gout or rheumatoid arthritis. There is no history of diabetes, gout, osteoarthritis or rheumatoid arthritis.   Benign intracranial hypertension Followed by Dr. Nedra Hai [facility]). Last eye exam 2025: normal per patient. Report requested. Last appointment with neurology-Dr. Lewie Loron 2019. Previous use of diamox. Denies any headache or dizziness or tinnitus or unsteady gait  Entered referral to neurology to re establish.  Reviewed medical, surgical, and social history today  Medications: Outpatient Medications Prior to Visit  Medication Sig Note   acetaminophen (TYLENOL) 325 MG tablet Take 650 mg by mouth every 6 (six) hours as needed for mild pain or moderate pain.    [DISCONTINUED] cetirizine (ZYRTEC) 10 MG tablet TAKE 1 TABLET BY MOUTH EVERYDAY AT BEDTIME    [DISCONTINUED] Cholecalciferol (VITAMIN D3) 25 MCG (1000 UT) CAPS Take 1 capsule (1,000 Units total) by mouth daily. (Patient not taking: Reported on 08/14/2023)    [DISCONTINUED] Semaglutide-Weight Management (WEGOVY) 0.25 MG/0.5ML SOAJ  Inject 0.25 mg into the skin once a week. (Patient not taking: Reported on 08/14/2023) 08/14/2023: Never received    No facility-administered medications prior to visit.   Reviewed past medical and social history.   ROS per HPI above     Objective:  BP 126/78 (BP Location: Left Arm, Patient Position: Sitting, Cuff Size: Large)   Pulse 92   Temp 98.6 F (37 C) (Temporal)   Ht 5\' 5"  (1.651 m)   Wt 293 lb 6.4 oz (133.1 kg)   LMP 08/14/2023   SpO2 99%   BMI 48.82 kg/m      Physical Exam Vitals and nursing note reviewed.  Musculoskeletal:     Right forearm: Normal.     Right wrist: Tenderness present. No swelling, deformity, effusion, lacerations, bony tenderness, snuff box tenderness or crepitus. Normal range of motion. Normal pulse.     Left wrist: Normal.     Right hand: Normal.     Cervical back: Normal range of motion and neck supple.  Skin:    Findings: No erythema or rash.     No results found for any visits on 08/14/23.    Assessment & Plan:    Problem List Items Addressed This Visit     Benign intracranial hypertension   Followed by Dr. Nedra Hai [facility]). Last eye exam 2025: normal per patient. Report requested. Last appointment with neurology-Dr. Lewie Loron 2019. Previous use of diamox. Denies any headache or dizziness or tinnitus or unsteady gait  Entered referral to neurology to re establish.  Relevant Orders   Ambulatory referral to Neurology   Other Visit Diagnoses       Tenosynovitis of wrist    -  Primary   Relevant Medications   naproxen (NAPROSYN) 500 MG tablet     Use wrist brace with spicca splint daily Apply cold compress 2-3x/day. Start naproxen as prescribed Have work accomodation form sent to me: use of wrist brace daily. avoid lifting, pulling and pushing x 2weeks.  Return in about 6 months (around 02/14/2024) for CPE (fasting).     Alysia Penna, NP

## 2023-08-14 NOTE — Assessment & Plan Note (Addendum)
 Followed by Dr. Nedra Hai [facility]). Last eye exam 2025: normal per patient. Report requested. Last appointment with neurology-Dr. Lewie Loron 2019. Previous use of diamox. Denies any headache or dizziness or tinnitus or unsteady gait  Entered referral to neurology to re establish.

## 2023-08-18 ENCOUNTER — Encounter: Payer: Self-pay | Admitting: Nurse Practitioner

## 2023-08-21 ENCOUNTER — Telehealth: Payer: Self-pay | Admitting: Nurse Practitioner

## 2023-08-21 NOTE — Telephone Encounter (Signed)
 Called and left a voice message asking to give me a call back at [phone]. I will try calling patient again.  Per Alysia Penna, NP: ask patient if this is for the accommodations she wrote for patient at the time of office visit or if this is for leave?

## 2023-08-21 NOTE — Telephone Encounter (Signed)
 Pt needs her FMLA forms faxed to [employer] # [phone]

## 2023-08-21 NOTE — Telephone Encounter (Signed)
 Called and spoke with patient. She stated that her employer is not accepting the work note and she was placed on leave and told to have her PCP fill out the [employer] paper work. She wanted to know if the papers were filled out and if we could fax back to number on paper work and number she gave when calling in today. She thanked me for calling and all question (if any) were answered.

## 2023-08-23 ENCOUNTER — Telehealth: Payer: Self-pay

## 2023-08-23 ENCOUNTER — Ambulatory Visit (INDEPENDENT_AMBULATORY_CARE_PROVIDER_SITE_OTHER): Payer: Commercial Managed Care - PPO | Admitting: Internal Medicine

## 2023-08-23 NOTE — Telephone Encounter (Signed)
 Faxed completed forms to [employer].

## 2023-08-23 NOTE — Telephone Encounter (Signed)
 Copied from CRM [id number]. Topic: General - Other >> Aug 23, 2023 10:12 AM Almira Coaster wrote: Reason for CRM: [employer] is calling for the status of the short term disability forms, advised from previous encounter that Stacy Mills has faxed the forms back, they are asking for a re-fax to number [phone].

## 2023-08-23 NOTE — Telephone Encounter (Signed)
 FMLA forms were faxed to [employer] at 8:18 AM this morning. Patient was sent a MyChart message informing that paper work was faxed and to call and/or message if she has any other questions.

## 2023-08-23 NOTE — Telephone Encounter (Signed)
 Paperwork was re faxed and sent on 08/23/2023 with confirmation attached.

## 2023-09-13 ENCOUNTER — Encounter (INDEPENDENT_AMBULATORY_CARE_PROVIDER_SITE_OTHER): Payer: Self-pay | Admitting: Internal Medicine

## 2023-09-13 ENCOUNTER — Ambulatory Visit (INDEPENDENT_AMBULATORY_CARE_PROVIDER_SITE_OTHER): Admitting: Internal Medicine

## 2023-09-13 VITALS — BP 132/89 | HR 83 | Temp 98.5°F | Ht 65.0 in | Wt 289.0 lb

## 2023-09-13 DIAGNOSIS — Z6841 Body Mass Index (BMI) 40.0 and over, adult: Secondary | ICD-10-CM | POA: Diagnosis not present

## 2023-09-13 DIAGNOSIS — E66813 Obesity, class 3: Secondary | ICD-10-CM

## 2023-09-13 DIAGNOSIS — R638 Other symptoms and signs concerning food and fluid intake: Secondary | ICD-10-CM

## 2023-09-13 DIAGNOSIS — K760 Fatty (change of) liver, not elsewhere classified: Secondary | ICD-10-CM | POA: Diagnosis not present

## 2023-09-13 MED ORDER — PHENTERMINE HCL 37.5 MG PO TABS: 18.7500 mg | ORAL_TABLET | Freq: Every day | ORAL | 0 refills | Status: DC

## 2023-09-13 NOTE — Progress Notes (Unsigned)
 Office: [phone]  /  Fax: [phone]  Weight Summary And Biometrics  Vitals Temp: 98.5 F (36.9 C) BP: 132/89 Pulse Rate: 83 SpO2: 99 %   Anthropometric Measurements Height: 5\' 5"  (1.651 m) Weight: 289 lb (131.1 kg) BMI (Calculated): 48.09 Weight at Last Visit: 284 lb Weight Lost Since Last Visit: 0 lb Weight Gained Since Last Visit: 5 lb Starting Weight: 276 lb Total Weight Loss (lbs): 0 lb (0 kg) Peak Weight: 283 lb   Body Composition  Body Fat %: 49.6 % Fat Mass (lbs): 143.4 lbs Muscle Mass (lbs): 138.4 lbs Total Body Water (lbs): 105.2 lbs Visceral Fat Rating : 15    RMR: [id number]  Today's Visit #: 18  Starting Date: 04/05/22   Subjective   Chief Complaint: Obesity  Interval History  Discussed the use of AI scribe software for clinical note transcription with the patient, who gave verbal consent to proceed.  History of Present Illness   Stacy Mills is a 28 year old female who presents for medical weight management.  She has been participating in the weight management program since November 2023 and has struggled with weight loss despite efforts. Her resting energy expenditure was measured at 2318 calories, which is higher than expected given her weight gain and difficulty losing weight.  She consumes approximately three meals a day, including fruits like pineapples and peaches, vegetables, and proteins such as eggs and non-pork meats. She has eliminated soda from her diet and drinks orange juice in the morning, unsweetened tea with Splenda, and water throughout the day.  She has a history of fatty liver identified on a CT scan from April 2023. Her last A1c was 5.2, and she is due for blood work to check cholesterol, A1c, insulin, liver enzymes, and vitamin D levels.  She works overnight shifts occasionally and reports getting about five hours of sleep, stating 'my brain just won't shut off.'  She has not taken phentermine before and is  considering its use for appetite suppression.        Challenges affecting patient progress: having difficulty preparing healthy meals, having difficulty with meal prep and planning, having difficulty focusing on healthy eating, difficulty implementing reduced calorie nutrition plan, need for convenience or prepackaged foods, limited food variation or intolerances, exposure to enticing environments and/or relationships, and low volume of physical activity at present .    Pharmacotherapy for weight management: She is currently taking no anti-obesity medication and does not have coverage for GLP-1 had been on metformin and topiramate in the past without clinical response or experience side effects of medications were discontinued .   Assessment and Plan   Treatment Plan For Obesity:  Recommended Dietary Goals  Stacy Mills is currently in the action stage of change. As such, her goal is to continue weight management plan. She has agreed to: follow the Category 3 plan - 1500 kcal per day  Behavioral Health and Counseling  We discussed the following behavioral modification strategies today: increasing lean protein intake to established goals, decreasing simple carbohydrates , increasing vegetables, increasing lower glycemic fruits, increasing fiber rich foods, avoiding skipping meals, increasing water intake , and work on meal planning and preparation.  Additional education and resources provided today: Category 3 packet  Recommended Physical Activity Goals  Stacy Mills has been advised to work up to 150 minutes of moderate intensity aerobic activity a week and strengthening exercises 2-3 times per week for cardiovascular health, weight loss maintenance and preservation of muscle mass.   She has agreed to :  Think about enjoyable ways to increase daily physical activity and overcoming barriers to exercise and Increase physical activity in their day and reduce sedentary time (increase  NEAT).  Pharmacotherapy  We discussed various medication options to help Stacy Mills with her weight loss efforts and we both agreed to : start anti-obesity medication.  In addition to reduced calorie nutrition plan (RCNP), behavioral strategies and physical activity, Stacy Mills would benefit from pharmacotherapy to assist with hunger signals, satiety and cravings. This will reduce obesity-related health risks by inducing weight loss, and help reduce food consumption and adherence to [facility]) . It may also improve QOL by improving self-confidence and reduce the  setbacks associated with metabolic adaptations.  After discussion of treatment options, mechanisms of action, benefits, side effects, contraindications and shared decision making she is agreeable to starting Adipex 18.75 mg once a day. Patient also made aware that medication is indicated for long-term management of obesity and the risk of weight regain following discontinuation of treatment and hence the importance of adhering to medical weight loss plan.  We demonstrated use of device and patient using teach back method was able to demonstrate proper technique.  Reviewed and signed controlled substance agreement today with patient Reviewed state registry for controlled medications no other controlled medications found  Associated Conditions Impacted by Obesity Treatment  Class 3 severe obesity with serious comorbidity and body mass index (BMI) of 40.0 to 44.9 in adult, unspecified obesity type (HCC) -     CBC with Differential/Platelet -     CMP14+EGFR -     Lipid Panel With LDL/HDL Ratio -     Hemoglobin A1c -     Insulin, random -     VITAMIN D 25 Hydroxy (Vit-D Deficiency, Fractures)  Metabolic dysfunction-associated steatotic liver disease (MASLD) -     CBC with Differential/Platelet -     CMP14+EGFR -     Lipid Panel With LDL/HDL Ratio -     Hemoglobin A1c -     Insulin, random -     VITAMIN D 25 Hydroxy (Vit-D Deficiency,  Fractures)  Abnormal food appetite  Other orders -     Phentermine HCl; Take 0.5 tablets (18.75 mg total) by mouth daily before breakfast.  Dispense: 15 tablet; Refill: 0     Assessment and Plan    Obesity She has been struggling with weight loss despite participation in the medical weight management program since November 2023. Indirect calorimetry today showed a resting energy expenditure of 2318 kcal, indicated normal metabolic rate.  The challenge is to reduce her caloric intake to 1500 calories per day to promote weight loss. She is considering a liquid diet and has been advised on dietary modifications, including increasing servings of fruits and vegetables and reducing intake of simple sugars and fatty meats. Phentermine was discussed as an appetite suppressant, with informed consent obtained regarding its use, potential side effects, and the goal of achieving a 5% weight reduction in six months. Risks include elevated heart rate, blood pressure, and potential insomnia or anxiety. - Prescribe phentermine 18.75 tablet daily before breakfast as an appetite suppressant. - Provide a meal plan to achieve 1500 calories per day. - Advise on dietary modifications: increase whole grains, fruits, and vegetables; reduce simple sugars and fatty meats. - Schedule follow-up in 4 weeks to assess response to phentermine and dietary changes.  Nonalcoholic Fatty Liver Disease (NAFLD) Fatty liver identified on a CT scan from April 2023, likely related to obesity and dietary habits. Monitoring liver enzymes and  managing weight are crucial to prevent progression. - Order blood tests to check liver enzymes, cholesterol, A1c, insulin, and vitamin D levels.  General Health Maintenance Emphasized the importance of a balanced diet, regular physical activity, adequate sleep, and stress management to support overall health and weight management. - Encourage 5-6 servings of fruits and vegetables daily. - Advise  on adequate sleep and stress management techniques.         Objective   Physical Exam:  Blood pressure 132/89, pulse 83, temperature 98.5 F (36.9 C), height 5\' 5"  (1.651 m), weight 289 lb (131.1 kg), last menstrual period 08/14/2023, SpO2 99%. Body mass index is 48.09 kg/m.  General: She is overweight, cooperative, alert, well developed, and in no acute distress. PSYCH: Has normal mood, affect and thought process.   HEENT: EOMI, sclerae are anicteric. Lungs: Normal breathing effort, no conversational dyspnea. Extremities: No edema.  Neurologic: No gross sensory or motor deficits. No tremors or fasciculations noted.    Diagnostic Data Reviewed:  BMET    Component Value Date/Time   NA 139 09/13/2022 0936   NA 140 04/05/2022 0839   K 3.9 09/13/2022 0936   CL 106 09/13/2022 0936   CO2 26 09/13/2022 0936   GLUCOSE 80 09/13/2022 0936   BUN 16 09/13/2022 0936   BUN 10 04/05/2022 0839   CREATININE 0.86 09/13/2022 0936   CREATININE 0.98 09/23/2021 1440   CREATININE 0.93 11/05/2012 1015   CALCIUM 9.2 09/13/2022 0936   GFRNONAA >60 09/23/2021 1440   GFRAA >60 05/31/2019 0417   Lab Results  Component Value Date   HGBA1C 5.2 09/13/2022   HGBA1C 5.4 05/09/2016   Lab Results  Component Value Date   INSULIN 15.4 03/27/2023   INSULIN 15.5 04/05/2022   Lab Results  Component Value Date   TSH 1.250 04/05/2022   CBC    Component Value Date/Time   WBC 8.7 04/05/2022 0839   WBC 7.2 09/23/2021 1440   WBC 8.1 09/16/2021 0847   RBC 4.65 04/05/2022 0839   RBC 4.01 09/23/2021 1440   HGB 13.8 04/05/2022 0839   HCT 42.1 04/05/2022 0839   PLT 333 04/05/2022 0839   MCV 91 04/05/2022 0839   MCH 29.7 04/05/2022 0839   MCH 29.9 09/23/2021 1440   MCHC 32.8 04/05/2022 0839   MCHC 33.1 09/23/2021 1440   RDW 11.6 (L) 04/05/2022 0839   Iron Studies No results found for: "IRON", "TIBC", "FERRITIN", "IRONPCTSAT" Lipid Panel     Component Value Date/Time   CHOL 154 03/27/2023  0918   TRIG 55 03/27/2023 0918   HDL 60 03/27/2023 0918   CHOLHDL 3 01/26/2021 1421   VLDL 9.0 01/26/2021 1421   LDLCALC 83 03/27/2023 0918   Hepatic Function Panel     Component Value Date/Time   PROT 7.0 09/13/2022 0936   PROT 7.2 04/05/2022 0839   ALBUMIN 4.1 09/13/2022 0936   ALBUMIN 4.4 04/05/2022 0839   AST 14 09/13/2022 0936   AST 15 09/23/2021 1440   ALT 13 09/13/2022 0936   ALT 12 09/23/2021 1440   ALKPHOS 53 09/13/2022 0936   BILITOT 0.3 09/13/2022 0936   BILITOT <0.2 04/05/2022 0839   BILITOT 0.4 09/23/2021 1440      Component Value Date/Time   TSH 1.250 04/05/2022 0839   Nutritional Lab Results  Component Value Date   VD25OH 19.8 (L) 03/27/2023   VD25OH 21.7 (L) 07/28/2022   VD25OH 11.1 (L) 04/05/2022    Medications: Outpatient Encounter Medications as of 09/13/2023  Medication Sig   acetaminophen (TYLENOL) 325 MG tablet Take 650 mg by mouth every 6 (six) hours as needed for mild pain or moderate pain.   phentermine (ADIPEX-P) 37.5 MG tablet Take 0.5 tablets (18.75 mg total) by mouth daily before breakfast.   [DISCONTINUED] naproxen (NAPROSYN) 500 MG tablet Take 1 tablet (500 mg total) by mouth 2 (two) times daily with a meal.   No facility-administered encounter medications on file as of 09/13/2023.     Follow-Up   Return in about 4 weeks (around 10/11/2023) for For Weight Mangement with Dr. Allie Area.Aaron Aas She was informed of the importance of frequent follow up visits to maximize her success with intensive lifestyle modifications for her multiple health conditions.  Attestation Statement   Reviewed by clinician on day of visit: allergies, medications, problem list, medical history, surgical history, family history, social history, and previous encounter notes.     Ladd Picker, MD

## 2023-09-14 ENCOUNTER — Encounter (INDEPENDENT_AMBULATORY_CARE_PROVIDER_SITE_OTHER): Payer: Self-pay | Admitting: Internal Medicine

## 2023-09-14 LAB — LIPID PANEL WITH LDL/HDL RATIO
Cholesterol, Total: 180 mg/dL (ref 100–199)
HDL: 69 mg/dL (ref >39)
LDL Chol Calc (NIH): 101 mg/dL — ABNORMAL HIGH (ref 0–99)
LDL/HDL Ratio: 1.5 ratio (ref 0.0–3.2)
Triglycerides: 48 mg/dL (ref 0–149)
VLDL Cholesterol Cal: 10 mg/dL (ref 5–40)

## 2023-09-14 LAB — CMP14+EGFR
ALT: 20 IU/L (ref 0–32)
AST: 21 IU/L (ref 0–40)
Albumin: 4.2 g/dL (ref 4.0–5.0)
Alkaline Phosphatase: 61 IU/L (ref 44–121)
BUN/Creatinine Ratio: 9 (ref 9–23)
BUN: 9 mg/dL (ref 6–20)
Bilirubin Total: 0.4 mg/dL (ref 0.0–1.2)
CO2: 22 mmol/L (ref 20–29)
Calcium: 9.3 mg/dL (ref 8.7–10.2)
Chloride: 106 mmol/L (ref 96–106)
Creatinine, Ser: 0.95 mg/dL (ref 0.57–1.00)
Globulin, Total: 2.8 g/dL (ref 1.5–4.5)
Glucose: 73 mg/dL (ref 70–99)
Potassium: 4.1 mmol/L (ref 3.5–5.2)
Sodium: 141 mmol/L (ref 134–144)
Total Protein: 7 g/dL (ref 6.0–8.5)
eGFR: 83 mL/min/1.73 (ref >59)

## 2023-09-14 LAB — CBC WITH DIFFERENTIAL/PLATELET
Basophils Absolute: 0.1 10*3/uL (ref 0.0–0.2)
Basos: 1 %
EOS (ABSOLUTE): 0.2 10*3/uL (ref 0.0–0.4)
Eos: 2 %
Hematocrit: 40.5 % (ref 34.0–46.6)
Hemoglobin: 13.6 g/dL (ref 11.1–15.9)
Immature Grans (Abs): 0 10*3/uL (ref 0.0–0.1)
Immature Granulocytes: 0 %
Lymphocytes Absolute: 3.5 10*3/uL — ABNORMAL HIGH (ref 0.7–3.1)
Lymphs: 36 %
MCH: 29.7 pg (ref 26.6–33.0)
MCHC: 33.6 g/dL (ref 31.5–35.7)
MCV: 88 fL (ref 79–97)
Monocytes Absolute: 0.7 10*3/uL (ref 0.1–0.9)
Monocytes: 7 %
Neutrophils Absolute: 5.2 10*3/uL (ref 1.4–7.0)
Neutrophils: 54 %
Platelets: 319 10*3/uL (ref 150–450)
RBC: 4.58 x10E6/uL (ref 3.77–5.28)
RDW: 11.8 % (ref 11.7–15.4)
WBC: 9.7 10*3/uL (ref 3.4–10.8)

## 2023-09-14 LAB — VITAMIN D 25 HYDROXY (VIT D DEFICIENCY, FRACTURES): Vit D, 25-Hydroxy: 20.1 ng/mL — ABNORMAL LOW (ref 30.0–100.0)

## 2023-09-14 LAB — HEMOGLOBIN A1C
Est. average glucose Bld gHb Est-mCnc: 108 mg/dL
Hgb A1c MFr Bld: 5.4 % (ref 4.8–5.6)

## 2023-09-14 LAB — INSULIN, RANDOM: INSULIN: 18.1 u[IU]/mL (ref 2.6–24.9)

## 2023-10-30 ENCOUNTER — Encounter (INDEPENDENT_AMBULATORY_CARE_PROVIDER_SITE_OTHER): Payer: Self-pay | Admitting: Internal Medicine

## 2023-10-30 ENCOUNTER — Ambulatory Visit (INDEPENDENT_AMBULATORY_CARE_PROVIDER_SITE_OTHER): Admitting: Internal Medicine

## 2023-10-30 VITALS — BP 125/83 | HR 76 | Temp 98.0°F | Ht 65.0 in | Wt 291.0 lb

## 2023-10-30 DIAGNOSIS — E559 Vitamin D deficiency, unspecified: Secondary | ICD-10-CM

## 2023-10-30 DIAGNOSIS — Z6841 Body Mass Index (BMI) 40.0 and over, adult: Secondary | ICD-10-CM | POA: Diagnosis not present

## 2023-10-30 DIAGNOSIS — K760 Fatty (change of) liver, not elsewhere classified: Secondary | ICD-10-CM

## 2023-10-30 DIAGNOSIS — E66813 Obesity, class 3: Secondary | ICD-10-CM

## 2023-10-30 MED ORDER — PHENTERMINE HCL 37.5 MG PO TABS: 37.5000 mg | ORAL_TABLET | Freq: Every day | ORAL | 0 refills | Status: DC

## 2023-10-30 MED ORDER — VITAMIN D (ERGOCALCIFEROL) 1.25 MG (50000 UNIT) PO CAPS: 50000.0000 [IU] | ORAL_CAPSULE | ORAL | 0 refills | Status: DC

## 2023-10-30 NOTE — Progress Notes (Signed)
 Office: [phone]  /  Fax: [phone]  Weight Summary And Biometrics  Vitals Temp: 98 F (36.7 C) BP: 125/83 Pulse Rate: 76 SpO2: 97 %   Anthropometric Measurements Height: 5\' 5"  (1.651 m) Weight: 291 lb (132 kg) BMI (Calculated): 48.43 Weight at Last Visit: 289 lb Weight Lost Since Last Visit: 0 lb Weight Gained Since Last Visit: 2 lb Starting Weight: 276 lb Total Weight Loss (lbs): 0 lb (0 kg) Peak Weight: 283 lb   Body Composition  Body Fat %: 50.5 % Fat Mass (lbs): 147.2 lbs Muscle Mass (lbs): 137 lbs Total Body Water (lbs): 106.4 lbs Visceral Fat Rating : 15    RMR: 2318  Today's Visit #: 19  Starting Date: 04/05/22   Subjective   Chief Complaint: Obesity  Interval History Discussed the use of AI scribe software for clinical note transcription with the patient, who gave verbal consent to proceed.  History of Present Illness   Stacy Mills is a 28 year old female who presents for medical weight management.  She has gained two pounds since her last office visit. She is not following a specific dietary plan and is not tracking her calorie intake, although she mentions eating more whole foods and denies skipping meals. She is currently not engaging in any exercise.  She is taking phentermine , initially at half a tablet in the morning, which has helped her feel less hungry and fuller. She experienced some jitteriness when she first started the medication, but this has since subsided. She consumes energy drinks occasionally.  Her work schedule is from 7:00 AM to 3:30 PM, during which she eats breakfast and lunch at work. Her typical meals include yogurt with fruit and granola for breakfast and a salad with shrimp and cucumbers for lunch. She is not fond of cottage cheese due to its texture.  Her son is involved in outdoor sports, and she spends time at his practices, which impacts her ability to find time for exercise. She mentions doing schoolwork  while attending her son's sports events.  No palpitations and no significant jitteriness after the initial period of taking phentermine .       Challenges affecting patient progress: having difficulty with meal prep and planning, difficulty implementing reduced calorie nutrition plan, and low volume of physical activity at present .    Pharmacotherapy for weight management: She is currently taking Phentermine  (longterm use, single agent)  with adequate clinical response  and without side effects..   Assessment and Plan   Treatment Plan For Obesity:  Recommended Dietary Goals  Marisabel is currently in the action stage of change. As such, her goal is to continue weight management plan. She has agreed to: follow a tailored, multi-day, low carbohydrate, high protein plan targeting 1500 calories and 100-130 grams of protein per day, plan incorporates 1 meal replacement a day  Behavioral Health and Counseling  We discussed the following behavioral modification strategies today: continue to work on maintaining a reduced calorie state, getting the recommended amount of protein, incorporating whole foods, making healthy choices, staying well hydrated and practicing mindfulness when eating. and reduce consumption of processed foods.  Additional education and resources provided today: None  Recommended Physical Activity Goals  George has been advised to work up to 150 minutes of moderate intensity aerobic activity a week and strengthening exercises 2-3 times per week for cardiovascular health, weight loss maintenance and preservation of muscle mass.   She has agreed to :  Think about enjoyable ways to increase  daily physical activity and overcoming barriers to exercise and Increase physical activity in their day and reduce sedentary time (increase NEAT).  Pharmacotherapy  We discussed various medication options to help Akshitha with her weight loss efforts and we both agreed to : Increase  phentermine  to 37.5 mg daily.  If no weight loss after 2 months medication will be discontinued.  Associated Conditions Impacted by Obesity Treatment  Class 3 severe obesity with serious comorbidity and body mass index (BMI) of 40.0 to 44.9 in adult Assessment & Plan: No weight loss now on phentermine .  Different nutritional strategies have been tried in the past.  Has not tracked and journal successfully.Provided with a tailored 1500-calorie meal plan, easy to prepare and includes 1 meal replacement a day Increase phentermine  to 37.5 mg a day, discontinue if no weight loss after 2 months We again emphasized the importance of prioritizing physical activity as she may have diet resistant obesity.  Orders: -     Phentermine  HCl; Take 1 tablet (37.5 mg total) by mouth daily before breakfast.  Dispense: 30 tablet; Refill: 0  Vitamin D  deficiency Assessment & Plan: Vitamin D  levels are still low she will be started on high-dose vitamin D  50,000 units 1 tablet weekly for 4 months and then transition to over-the-counter supplementation.  Orders: -     Vitamin D  (Ergocalciferol ); Take 1 capsule (50,000 Units total) by mouth every 7 (seven) days.  Dispense: 16 capsule; Refill: 0  Metabolic dysfunction-associated steatotic liver disease (MASLD) Assessment & Plan: She has mild hepatic steatosis on CT scan from 2023 with normal liver enzymes.  Fibrosis 4 Score = .43  Fib-4 interpretation is not validated for people under 35 or over 14 years of age. However, scores under 2.0 are generally considered low risk.  Has not been able to lose weight and has no coverage for GLP-1.  Provided with a new meal plan, antiobesity medications are being adjusted.         Objective   Physical Exam:  Blood pressure 125/83, pulse 76, temperature 98 F (36.7 C), height 5\' 5"  (1.651 m), weight 291 lb (132 kg), last menstrual period 10/24/2023, SpO2 97%. Body mass index is 48.42 kg/m.  General: She is  overweight, cooperative, alert, well developed, and in no acute distress. PSYCH: Has normal mood, affect and thought process.   HEENT: EOMI, sclerae are anicteric. Lungs: Normal breathing effort, no conversational dyspnea. Extremities: No edema.  Neurologic: No gross sensory or motor deficits. No tremors or fasciculations noted.    Diagnostic Data Reviewed:  BMET    Component Value Date/Time   NA 141 09/13/2023 1532   K 4.1 09/13/2023 1532   CL 106 09/13/2023 1532   CO2 22 09/13/2023 1532   GLUCOSE 73 09/13/2023 1532   GLUCOSE 80 09/13/2022 0936   BUN 9 09/13/2023 1532   CREATININE 0.95 09/13/2023 1532   CREATININE 0.98 09/23/2021 1440   CREATININE 0.93 11/05/2012 1015   CALCIUM 9.3 09/13/2023 1532   GFRNONAA >60 09/23/2021 1440   GFRAA >60 05/31/2019 0417   Lab Results  Component Value Date   HGBA1C 5.4 09/13/2023   HGBA1C 5.4 05/09/2016   Lab Results  Component Value Date   INSULIN  18.1 09/13/2023   INSULIN  15.5 04/05/2022   Lab Results  Component Value Date   TSH 1.250 04/05/2022   CBC    Component Value Date/Time   WBC 9.7 09/13/2023 1532   WBC 7.2 09/23/2021 1440   WBC 8.1 09/16/2021 0847  RBC 4.58 09/13/2023 1532   RBC 4.01 09/23/2021 1440   HGB 13.6 09/13/2023 1532   HCT 40.5 09/13/2023 1532   PLT 319 09/13/2023 1532   MCV 88 09/13/2023 1532   MCH 29.7 09/13/2023 1532   MCH 29.9 09/23/2021 1440   MCHC 33.6 09/13/2023 1532   MCHC 33.1 09/23/2021 1440   RDW 11.8 09/13/2023 1532   Iron Studies No results found for: "IRON", "TIBC", "FERRITIN", "IRONPCTSAT" Lipid Panel     Component Value Date/Time   CHOL 180 09/13/2023 1532   TRIG 48 09/13/2023 1532   HDL 69 09/13/2023 1532   CHOLHDL 3 01/26/2021 1421   VLDL 9.0 01/26/2021 1421   LDLCALC 101 (H) 09/13/2023 1532   Hepatic Function Panel     Component Value Date/Time   PROT 7.0 09/13/2023 1532   ALBUMIN 4.2 09/13/2023 1532   AST 21 09/13/2023 1532   AST 15 09/23/2021 1440   ALT 20  09/13/2023 1532   ALT 12 09/23/2021 1440   ALKPHOS 61 09/13/2023 1532   BILITOT 0.4 09/13/2023 1532   BILITOT 0.4 09/23/2021 1440      Component Value Date/Time   TSH 1.250 04/05/2022 0839   Nutritional Lab Results  Component Value Date   VD25OH 20.1 (L) 09/13/2023   VD25OH 19.8 (L) 03/27/2023   VD25OH 21.7 (L) 07/28/2022    Medications: Outpatient Encounter Medications as of 10/30/2023  Medication Sig   acetaminophen  (TYLENOL ) 325 MG tablet Take 650 mg by mouth every 6 (six) hours as needed for mild pain or moderate pain.   [DISCONTINUED] phentermine  (ADIPEX-P ) 37.5 MG tablet Take 0.5 tablets (18.75 mg total) by mouth daily before breakfast.   [DISCONTINUED] Vitamin D , Ergocalciferol , (DRISDOL ) 1.25 MG (50000 UNIT) CAPS capsule Take 1 capsule (50,000 Units total) by mouth every 7 (seven) days.   phentermine  (ADIPEX-P ) 37.5 MG tablet Take 1 tablet (37.5 mg total) by mouth daily before breakfast.   Vitamin D , Ergocalciferol , (DRISDOL ) 1.25 MG (50000 UNIT) CAPS capsule Take 1 capsule (50,000 Units total) by mouth every 7 (seven) days.   [DISCONTINUED] phentermine  (ADIPEX-P ) 37.5 MG tablet Take 1 tablet (37.5 mg total) by mouth daily before breakfast.   [DISCONTINUED] Vitamin D , Ergocalciferol , (DRISDOL ) 1.25 MG (50000 UNIT) CAPS capsule Take 1 capsule (50,000 Units total) by mouth every 7 (seven) days.   No facility-administered encounter medications on file as of 10/30/2023.     Follow-Up   Return in about 4 weeks (around 11/27/2023) for For Weight Mangement with Dr. Allie Area.Aaron Aas She was informed of the importance of frequent follow up visits to maximize her success with intensive lifestyle modifications for her multiple health conditions.  Attestation Statement   Reviewed by clinician on day of visit: allergies, medications, problem list, medical history, surgical history, family history, social history, and previous encounter notes.     Ladd Picker, MD

## 2023-10-30 NOTE — Assessment & Plan Note (Addendum)
 No weight loss now on phentermine .  Different nutritional strategies have been tried in the past.  Has not tracked and journal successfully.Provided with a tailored 1500-calorie meal plan, easy to prepare and includes 1 meal replacement a day Increase phentermine  to 37.5 mg a day, discontinue if no weight loss after 2 months We again emphasized the importance of prioritizing physical activity as she may have diet resistant obesity.

## 2023-10-30 NOTE — Assessment & Plan Note (Signed)
 She has mild hepatic steatosis on CT scan from 2023 with normal liver enzymes.  Fibrosis 4 Score = .43  Fib-4 interpretation is not validated for people under 35 or over 29 years of age. However, scores under 2.0 are generally considered low risk.  Has not been able to lose weight and has no coverage for GLP-1.  Provided with a new meal plan, antiobesity medications are being adjusted.

## 2023-10-30 NOTE — Assessment & Plan Note (Signed)
 Vitamin D  levels are still low she will be started on high-dose vitamin D  50,000 units 1 tablet weekly for 4 months and then transition to over-the-counter supplementation.

## 2023-11-07 ENCOUNTER — Ambulatory Visit
Admission: RE | Admit: 2023-11-07 | Discharge: 2023-11-07 | Disposition: A | Discharge status: Home / Self Care | Source: Ambulatory Visit | Attending: Physician Assistant | Admitting: Physician Assistant

## 2023-11-07 VITALS — BP 115/88 | HR 77 | Temp 98.2°F | Resp 18 | Wt 291.0 lb

## 2023-11-07 DIAGNOSIS — B9689 Other specified bacterial agents as the cause of diseases classified elsewhere: Secondary | ICD-10-CM | POA: Insufficient documentation

## 2023-11-07 DIAGNOSIS — N760 Acute vaginitis: Secondary | ICD-10-CM | POA: Insufficient documentation

## 2023-11-07 MED ORDER — METRONIDAZOLE 0.75 % VA GEL: 1.0000 | Freq: Two times a day (BID) | VAGINAL | 0 refills | Status: DC

## 2023-11-07 NOTE — ED Provider Notes (Signed)
 [facility]    CSN: [id number] Arrival date & time: 11/07/23  1539      History   Chief Complaint Chief Complaint  Patient presents with   SEXUALLY TRANSMITTED DISEASE    Also would like to be tested for bacteria vaginosis and whatever else - Entered by patient    HPI Stacy Mills is a 28 y.o. female.   Patient concerned about bacterial vaginitis.  Patient reports using MetroGel  from an old prescription.  Patient reports frequent episodes of bacterial vaginitis.  Patient would also like to be checked for sexually transmitted diseases.  Patient denies any fever or chills no nausea or vomiting.  Patient reports some vaginal odor and discharge.  The history is provided by the patient. No language interpreter was used.    Past Medical History:  Diagnosis Date   Allergy    Anxiety    Back pain    Depression    Gallbladder problem    HSV (herpes simplex virus) anogenital infection    IIH (idiopathic intracranial hypertension)    IUD (intrauterine device) in place    Paraguard placed 2014, removed 10/2015   Migraine    Pelvic inflammation in female    Pelvic inflammatory disease 10/2012    Patient Active Problem List   Diagnosis Date Noted   Metabolic dysfunction-associated steatotic liver disease (MASLD) 09/13/2023   Primary insomnia 03/12/2023   Obesity- Start BMI 44.43 01/12/2023   Abnormal food appetite 07/28/2022   Vitamin D  deficiency 06/23/2022   Insulin  resistance 06/23/2022   Family history of non-Hodgkin's lymphoma 09/09/2021   Lymphadenopathy 09/09/2021   Lower abdominal pain 08/31/2021   Acanthosis nigricans 01/26/2021   Liver abscess 05/30/2019   Genital herpes simplex 06/22/2018   Benign intracranial hypertension 05/19/2018   Migraines 12/22/2014   Hemorrhagic cyst of ovary 11/07/2012   Class 3 severe obesity with serious comorbidity and body mass index (BMI) of 40.0 to 44.9 in adult 08/26/2012    Past Surgical History:  Procedure  Laterality Date   CESAREAN SECTION N/A 08/19/2018   Procedure: CESAREAN SECTION;  Surgeon: Marylu Soda, MD;  Location: [facility];  Service: Obstetrics;  Laterality: N/A;   CHOLECYSTECTOMY     spinal tap      OB History     Gravida  1   Para  1   Term  1   Preterm      AB  0   Living  1      SAB      IAB      Ectopic  0   Multiple  0   Live Births  1            Home Medications    Prior to Admission medications   Medication Sig Start Date End Date Taking? Authorizing Provider  acetaminophen  (TYLENOL ) 325 MG tablet Take 650 mg by mouth every 6 (six) hours as needed for mild pain or moderate pain.    [provider]  phentermine  (ADIPEX-P ) 37.5 MG tablet Take 1 tablet (37.5 mg total) by mouth daily before breakfast. 10/30/23   Ladd Picker, MD  Vitamin D , Ergocalciferol , (DRISDOL ) 1.25 MG (50000 UNIT) CAPS capsule Take 1 capsule (50,000 Units total) by mouth every 7 (seven) days. 10/30/23   Ladd Picker, MD    Family History Family History  Problem Relation Age of Onset   Asthma Mother    High blood pressure Mother    Obesity Mother    Varicose Veins Mother  Hypertension Father    Alcoholism Father    Drug abuse Father    Alcohol abuse Father    Emphysema Maternal Grandfather    Depression Maternal Grandfather    Anxiety disorder Maternal Grandfather    Mental illness Maternal Grandfather    Diabetes Maternal Grandfather    COPD Maternal Grandfather    Cancer Paternal Grandmother    Non-Hodgkin's lymphoma Paternal Grandmother    Sudden death Paternal Grandmother    Early death Paternal Grandmother    Alcohol abuse Maternal Aunt    Hyperlipidemia Maternal Aunt    Anxiety disorder Sister    Anxiety disorder Son    Asthma Son     Social History Social History   Tobacco Use   Smoking status: Former   Smokeless tobacco: Never  Vaping Use   Vaping status: Never Used  Substance Use Topics   Alcohol use: Yes     Alcohol/week: 2.0 standard drinks of alcohol    Types: 2 Shots of liquor per week    Comment: social   Drug use: Not Currently    Types: Marijuana     Allergies   Ivp dye [iodinated contrast media]   Review of Systems Review of Systems  All other systems reviewed and are negative.    Physical Exam Triage Vital Signs ED Triage Vitals  Encounter Vitals Group     BP 11/07/23 1628 115/88     Systolic BP Percentile --      Diastolic BP Percentile --      Pulse Rate 11/07/23 1628 77     Resp 11/07/23 1628 18     Temp 11/07/23 1628 98.2 F (36.8 C)     Temp Source 11/07/23 1628 Oral     SpO2 11/07/23 1628 98 %     Weight 11/07/23 1628 291 lb 0.1 oz (132 kg)     Height --      Head Circumference --      Peak Flow --      Pain Score 11/07/23 1625 0     Pain Loc --      Pain Education --      Exclude from Growth Chart --    No data found.  Updated Vital Signs BP 115/88 (BP Location: Left Arm)   Pulse 77   Temp 98.2 F (36.8 C) (Oral)   Resp 18   Wt 132 kg   LMP 10/24/2023 (Exact Date)   SpO2 98%   BMI 48.43 kg/m   Visual Acuity Right Eye Distance:   Left Eye Distance:   Bilateral Distance:    Right Eye Near:   Left Eye Near:    Bilateral Near:     Physical Exam Vitals and nursing note reviewed.  Constitutional:      Appearance: She is well-developed.  HENT:     Head: Normocephalic.  Cardiovascular:     Rate and Rhythm: Normal rate.  Pulmonary:     Effort: Pulmonary effort is normal.  Abdominal:     General: There is no distension.  Musculoskeletal:        General: Normal range of motion.     Cervical back: Normal range of motion.  Skin:    General: Skin is warm.  Neurological:     General: No focal deficit present.     Mental Status: She is alert and oriented to person, place, and time.      UC Treatments / Results  Labs (all labs ordered are listed, but only  abnormal results are displayed) Labs Reviewed  CERVICOVAGINAL ANCILLARY ONLY     EKG   Radiology No results found.  Procedures Procedures (including critical care time)  Medications Ordered in UC Medications - No data to display  Initial Impression / Assessment and Plan / UC Course  I have reviewed the triage vital signs and the nursing notes.  Pertinent labs & imaging results that were available during my care of the patient were reviewed by me and considered in my medical decision making (see chart for details).     Patient counseled on safe sex Final Clinical Impressions(s) / UC Diagnoses   Final diagnoses:  BV (bacterial vaginosis)   Discharge Instructions   None    ED Prescriptions   None    PDMP not reviewed this encounter.   Sandi Crosby, PA-C 11/07/23 1713

## 2023-11-07 NOTE — ED Triage Notes (Signed)
 Pt presents requesting STD testing including blood work. Pt reports having a little discharge x 1 week. Pt denies any additional sxs.

## 2023-11-08 ENCOUNTER — Ambulatory Visit (HOSPITAL_COMMUNITY): Payer: Self-pay

## 2023-11-08 LAB — CERVICOVAGINAL ANCILLARY ONLY
Bacterial Vaginitis (gardnerella): POSITIVE — AB
Candida Glabrata: NEGATIVE
Candida Vaginitis: NEGATIVE
Chlamydia: NEGATIVE
Comment: NEGATIVE
Comment: NEGATIVE
Comment: NEGATIVE
Comment: NEGATIVE
Comment: NEGATIVE
Comment: NORMAL
Neisseria Gonorrhea: NEGATIVE
Trichomonas: NEGATIVE

## 2023-11-27 ENCOUNTER — Telehealth: Payer: Self-pay | Admitting: Diagnostic Neuroimaging

## 2023-11-27 NOTE — Telephone Encounter (Signed)
 LVM and sent mychart msg informing pt of need to reschedule 12/04/23 appt - MD out

## 2023-12-04 ENCOUNTER — Ambulatory Visit: Admitting: Diagnostic Neuroimaging

## 2023-12-06 ENCOUNTER — Ambulatory Visit (INDEPENDENT_AMBULATORY_CARE_PROVIDER_SITE_OTHER): Admitting: Internal Medicine

## 2023-12-06 ENCOUNTER — Encounter (INDEPENDENT_AMBULATORY_CARE_PROVIDER_SITE_OTHER): Payer: Self-pay | Admitting: Internal Medicine

## 2023-12-06 VITALS — BP 128/88 | HR 96 | Temp 98.3°F | Ht 65.0 in | Wt 283.0 lb

## 2023-12-06 DIAGNOSIS — K760 Fatty (change of) liver, not elsewhere classified: Secondary | ICD-10-CM | POA: Diagnosis not present

## 2023-12-06 DIAGNOSIS — E88819 Insulin resistance, unspecified: Secondary | ICD-10-CM

## 2023-12-06 DIAGNOSIS — Z6841 Body Mass Index (BMI) 40.0 and over, adult: Secondary | ICD-10-CM | POA: Diagnosis not present

## 2023-12-06 DIAGNOSIS — E66813 Obesity, class 3: Secondary | ICD-10-CM

## 2023-12-06 DIAGNOSIS — R638 Other symptoms and signs concerning food and fluid intake: Secondary | ICD-10-CM | POA: Diagnosis not present

## 2023-12-06 MED ORDER — PHENTERMINE HCL 37.5 MG PO TABS: 37.5000 mg | ORAL_TABLET | Freq: Every day | ORAL | 0 refills | Status: DC

## 2023-12-06 NOTE — Progress Notes (Signed)
 Office: [phone]  /  Fax: [phone]  Weight Summary and Body Composition Analysis (BIA)  Vitals Temp: 98.3 F (36.8 C) BP: 128/88 Pulse Rate: 96 SpO2: 99 %   Anthropometric Measurements Height: 5' 5 (1.651 m) Weight: 283 lb (128.4 kg) BMI (Calculated): 47.09 Weight at Last Visit: 291 lb Weight Lost Since Last Visit: 8 lb Weight Gained Since Last Visit: 0 lb Starting Weight: 276 lb Total Weight Loss (lbs): 0 lb (0 kg) Peak Weight: 283 lb   Body Composition  Body Fat %: 49.5 % Fat Mass (lbs): 140.4 lbs Muscle Mass (lbs): 136.2 lbs Total Body Water (lbs): 102.2 lbs Visceral Fat Rating : 15    RMR: 2318  Today's Visit #: 20  Starting Date: 04/05/22   Subjective   Chief Complaint: Obesity  Interval History Discussed the use of AI scribe software for clinical note transcription with the patient, who gave verbal consent to proceed.  History of Present Illness   Stacy Mills is a 28 year old female who presents for medical weight management.   She has not been exercising but has increased her physical activity at work.  She has been following a new dietary regimen found on [employer], which involves consuming fruits for breakfast, salads for lunch, and soup for dinner, while avoiding meat, alcohol, caffeine, and dairy products. She refers to this as a 'cleanse' and has been following it for about nine days, with occasional deviations. On days she 'cheats,' she compensates by adhering more strictly to the plan subsequently.  She has eliminated caffeine, specifically unsweetened tea, and is now drinking water. She has incorporated more fruits, vegetables, and soups into her diet, and has started using raspberry vinaigrette on her salads. She occasionally allows herself 'cheat days' on weekends or during the week, but tries to balance these with healthier choices.  She experiences hot, sweaty sensations in the mornings after taking her medication, which she  sometimes forgets to take due to a busy schedule. She also notes a decrease in appetite, stating that she sometimes does not feel like eating the next meal but tries to eat small amounts to avoid skipping meals.  She is currently taking phentermine , which she started at the last visit. She reports some side effects, including jitteriness and a dry taste in her mouth.       Challenges affecting patient progress: low volume of physical activity at present .    Pharmacotherapy for weight management: She is currently taking Phentermine  (longterm use, single agent)  with adequate clinical response  and experiencing the following side effects: Some flushing and jitteriness..   Assessment and Plan   Treatment Plan For Obesity:  Recommended Dietary Goals  Stacy Mills is currently in the action stage of change. As such, her goal is to continue weight management plan. She has agreed to: Patient has switched to a more plant-based diet and is feeling confident I would like for her to continue as she had not done well with more structured or prescriptive nutritional strategies.  Behavioral Health and Counseling  We discussed the following behavioral modification strategies today: continue to work on maintaining a reduced calorie state, getting the recommended amount of protein, incorporating whole foods, making healthy choices, staying well hydrated and practicing mindfulness when eating..  Additional education and resources provided today: Provided with personal guidance and instructions on how to use [employer] for healthy meal ideas and cooking in bulk.  Recommended Physical Activity Goals  Stacy Mills has been advised to work up to  150 minutes of moderate intensity aerobic activity a week and strengthening exercises 2-3 times per week for cardiovascular health, weight loss maintenance and preservation of muscle mass.   She has agreed to :  Think about enjoyable ways to increase daily physical  activity and overcoming barriers to exercise and Increase physical activity in their day and reduce sedentary time (increase NEAT).  Medical Interventions and Pharmacotherapy  We discussed various medication options to help Stacy Mills with her weight loss efforts and we both agreed to : Adequate clinical response to anti-obesity medication, continue current regimen and monitor for worsening side effects  Associated Conditions Impacted by Obesity Treatment  Assessment & Plan Class 3 severe obesity with serious comorbidity and body mass index (BMI) of 40.0 to 44.9 in adult She has lost eight pounds since the last visit, attributed to plant-based plan and phentermine . She adheres to the meal plan 90% of the time. Currently on day nine of a plant-based diet cleanse, excluding meat, alcohol, caffeine, and dairy. Reports side effects from phentermine , including jitteriness and dry mouth. Increased physical activity at work but lacks structured exercise. Current approach is effective. - Continue phentermine  prescription and send to [facility]. - Encourage continuation of current dietary habits, focusing on whole foods and plant-based meals. - Recommend using the [employer] website for recipe ideas, particularly for soups and salads. - Encourage regular physical activity, such as walking, and consider using a step counter to track activity. - Schedule follow-up appointment in one month.     Metabolic dysfunction-associated steatotic liver disease (MASLD) She has mild hepatic steatosis on CT scan from 2023 with normal liver enzymes.  Fibrosis 4 Score = .43  She is now on a plant-based diet which would be beneficial.  She continues to work on reducing highly processed foods simple and added sugars in her diet  Insulin  resistance Her HOMA-IR is 3.09 which is elevated. Optimal level < 1.9.   This is complex condition associated with genetics, ectopic fat and lifestyle factors. Insulin  resistance may result in  increased fat storage, inhibition of the breakdown of fat, cause fluctuations in blood sugar leading to energy crashes and increased cravings for sugary or high carb foods and cause metabolic slowdown making it difficult to lose weight.  This may result in additional weight gain and lead to pre-diabetes and diabetes if untreated. In addition, hyperinsulinemia increases cardiovascular risk, chronic inflammatory response and may increase the risk of obesity related malignancies.  Lab Results  Component Value Date   HGBA1C 5.4 09/13/2023   Lab Results  Component Value Date   INSULIN  18.1 09/13/2023   INSULIN  15.4 03/27/2023   INSULIN  15.5 04/05/2022   Lab Results  Component Value Date   GLUCOSE 73 09/13/2023   GLUCOSE 112 (H) 07/14/2012    We reviewed treatment options which include losing 7 to 10% of body weight, increasing volume of physical activity and maintaining a diet low in saturated fats and with a low glycemic load.  Patient has also been educated on the carb insulin  model of obesity.  She did not tolerate metformin .  Her insurance does not cover GLP-1.  She is now doing a plant-based diet which would be beneficial.  Continue with current nutritional strategy  Abnormal food appetite Improved on phentermine .  Continue medication         Objective   Physical Exam:  Blood pressure 128/88, pulse 96, temperature 98.3 F (36.8 C), height 5' 5 (1.651 m), weight 283 lb (128.4 kg), last menstrual period  11/21/2023, SpO2 99%. Body mass index is 47.09 kg/m.  General: She is overweight, cooperative, alert, well developed, and in no acute distress. PSYCH: Has normal mood, affect and thought process.   HEENT: EOMI, sclerae are anicteric. Lungs: Normal breathing effort, no conversational dyspnea. Extremities: No edema.  Neurologic: No gross sensory or motor deficits. No tremors or fasciculations noted.    Diagnostic Data Reviewed:  BMET    Component Value Date/Time   NA  141 09/13/2023 1532   K 4.1 09/13/2023 1532   CL 106 09/13/2023 1532   CO2 22 09/13/2023 1532   GLUCOSE 73 09/13/2023 1532   GLUCOSE 80 09/13/2022 0936   BUN 9 09/13/2023 1532   CREATININE 0.95 09/13/2023 1532   CREATININE 0.98 09/23/2021 1440   CREATININE 0.93 11/05/2012 1015   CALCIUM 9.3 09/13/2023 1532   GFRNONAA >60 09/23/2021 1440   GFRAA >60 05/31/2019 0417   Lab Results  Component Value Date   HGBA1C 5.4 09/13/2023   HGBA1C 5.4 05/09/2016   Lab Results  Component Value Date   INSULIN  18.1 09/13/2023   INSULIN  15.5 04/05/2022   Lab Results  Component Value Date   TSH 1.250 04/05/2022   CBC    Component Value Date/Time   WBC 9.7 09/13/2023 1532   WBC 7.2 09/23/2021 1440   WBC 8.1 09/16/2021 0847   RBC 4.58 09/13/2023 1532   RBC 4.01 09/23/2021 1440   HGB 13.6 09/13/2023 1532   HCT 40.5 09/13/2023 1532   PLT 319 09/13/2023 1532   MCV 88 09/13/2023 1532   MCH 29.7 09/13/2023 1532   MCH 29.9 09/23/2021 1440   MCHC 33.6 09/13/2023 1532   MCHC 33.1 09/23/2021 1440   RDW 11.8 09/13/2023 1532   Iron Studies No results found for: IRON, TIBC, FERRITIN, IRONPCTSAT Lipid Panel     Component Value Date/Time   CHOL 180 09/13/2023 1532   TRIG 48 09/13/2023 1532   HDL 69 09/13/2023 1532   CHOLHDL 3 01/26/2021 1421   VLDL 9.0 01/26/2021 1421   LDLCALC 101 (H) 09/13/2023 1532   Hepatic Function Panel     Component Value Date/Time   PROT 7.0 09/13/2023 1532   ALBUMIN 4.2 09/13/2023 1532   AST 21 09/13/2023 1532   AST 15 09/23/2021 1440   ALT 20 09/13/2023 1532   ALT 12 09/23/2021 1440   ALKPHOS 61 09/13/2023 1532   BILITOT 0.4 09/13/2023 1532   BILITOT 0.4 09/23/2021 1440      Component Value Date/Time   TSH 1.250 04/05/2022 0839   Nutritional Lab Results  Component Value Date   VD25OH 20.1 (L) 09/13/2023   VD25OH 19.8 (L) 03/27/2023   VD25OH 21.7 (L) 07/28/2022    Medications: Outpatient Encounter Medications as of 12/06/2023  Medication  Sig   acetaminophen  (TYLENOL ) 325 MG tablet Take 650 mg by mouth every 6 (six) hours as needed for mild pain or moderate pain.   metroNIDAZOLE  (METROGEL ) 0.75 % vaginal gel Place 1 Applicatorful vaginally 2 (two) times daily.   phentermine  (ADIPEX-P ) 37.5 MG tablet Take 1 tablet (37.5 mg total) by mouth daily before breakfast.   Vitamin D , Ergocalciferol , (DRISDOL ) 1.25 MG (50000 UNIT) CAPS capsule Take 1 capsule (50,000 Units total) by mouth every 7 (seven) days.   [DISCONTINUED] phentermine  (ADIPEX-P ) 37.5 MG tablet Take 1 tablet (37.5 mg total) by mouth daily before breakfast.   No facility-administered encounter medications on file as of 12/06/2023.     Follow-Up   Return in about 4 weeks (around  01/03/2024) for For Weight Mangement with Dr. Francyne.SABRA She was informed of the importance of frequent follow up visits to maximize her success with intensive lifestyle modifications for her multiple health conditions.  Attestation Statement   Reviewed by clinician on day of visit: allergies, medications, problem list, medical history, surgical history, family history, social history, and previous encounter notes.     Lucas Francyne, MD

## 2023-12-06 NOTE — Assessment & Plan Note (Signed)
 Improved on phentermine .  Continue medication

## 2023-12-06 NOTE — Assessment & Plan Note (Signed)
 Her HOMA-IR is 3.09 which is elevated. Optimal level < 1.9.   This is complex condition associated with genetics, ectopic fat and lifestyle factors. Insulin  resistance may result in increased fat storage, inhibition of the breakdown of fat, cause fluctuations in blood sugar leading to energy crashes and increased cravings for sugary or high carb foods and cause metabolic slowdown making it difficult to lose weight.  This may result in additional weight gain and lead to pre-diabetes and diabetes if untreated. In addition, hyperinsulinemia increases cardiovascular risk, chronic inflammatory response and may increase the risk of obesity related malignancies.  Lab Results  Component Value Date   HGBA1C 5.4 09/13/2023   Lab Results  Component Value Date   INSULIN  18.1 09/13/2023   INSULIN  15.4 03/27/2023   INSULIN  15.5 04/05/2022   Lab Results  Component Value Date   GLUCOSE 73 09/13/2023   GLUCOSE 112 (H) 07/14/2012    We reviewed treatment options which include losing 7 to 10% of body weight, increasing volume of physical activity and maintaining a diet low in saturated fats and with a low glycemic load.  Patient has also been educated on the carb insulin  model of obesity.  She did not tolerate metformin .  Her insurance does not cover GLP-1.  She is now doing a plant-based diet which would be beneficial.  Continue with current nutritional strategy

## 2023-12-06 NOTE — Assessment & Plan Note (Signed)
 She has lost eight pounds since the last visit, attributed to plant-based plan and phentermine . She adheres to the meal plan 90% of the time. Currently on day nine of a plant-based diet cleanse, excluding meat, alcohol, caffeine, and dairy. Reports side effects from phentermine , including jitteriness and dry mouth. Increased physical activity at work but lacks structured exercise. Current approach is effective. - Continue phentermine  prescription and [facility] to [facility]. - Encourage continuation of current dietary habits, focusing on whole foods and plant-based meals. - Recommend using the [employer] website for recipe ideas, particularly for soups and salads. - Encourage regular physical activity, such as walking, and consider using a step counter to track activity. - Schedule follow-up appointment in one month.

## 2023-12-06 NOTE — Assessment & Plan Note (Signed)
 She has mild hepatic steatosis on CT scan from 2023 with normal liver enzymes.  Fibrosis 4 Score = .43  She is now on a plant-based diet which would be beneficial.  She continues to work on reducing highly processed foods simple and added sugars in her diet

## 2024-01-03 ENCOUNTER — Encounter (INDEPENDENT_AMBULATORY_CARE_PROVIDER_SITE_OTHER): Payer: Self-pay | Admitting: Internal Medicine

## 2024-01-03 ENCOUNTER — Ambulatory Visit (INDEPENDENT_AMBULATORY_CARE_PROVIDER_SITE_OTHER): Admitting: Internal Medicine

## 2024-01-03 VITALS — BP 116/83 | HR 86 | Temp 98.0°F | Ht 65.0 in | Wt 275.0 lb

## 2024-01-03 DIAGNOSIS — Z6841 Body Mass Index (BMI) 40.0 and over, adult: Secondary | ICD-10-CM

## 2024-01-03 DIAGNOSIS — E66813 Obesity, class 3: Secondary | ICD-10-CM

## 2024-01-03 DIAGNOSIS — E88819 Insulin resistance, unspecified: Secondary | ICD-10-CM

## 2024-01-03 DIAGNOSIS — K760 Fatty (change of) liver, not elsewhere classified: Secondary | ICD-10-CM

## 2024-01-03 MED ORDER — PHENTERMINE HCL 37.5 MG PO TABS: 37.5000 mg | ORAL_TABLET | Freq: Every day | ORAL | 0 refills | Status: DC

## 2024-01-03 NOTE — Assessment & Plan Note (Signed)
 Her HOMA-IR is 3.09 which is elevated. Optimal level < 1.9.   This is complex condition associated with genetics, ectopic fat and lifestyle factors. Insulin  resistance may result in increased fat storage, inhibition of the breakdown of fat, cause fluctuations in blood sugar leading to energy crashes and increased cravings for sugary or high carb foods and cause metabolic slowdown making it difficult to lose weight.  This may result in additional weight gain and lead to pre-diabetes and diabetes if untreated. In addition, hyperinsulinemia increases cardiovascular risk, chronic inflammatory response and may increase the risk of obesity related malignancies.  Lab Results  Component Value Date   HGBA1C 5.4 09/13/2023   Lab Results  Component Value Date   INSULIN  18.1 09/13/2023   INSULIN  15.4 03/27/2023   INSULIN  15.5 04/05/2022   Lab Results  Component Value Date   GLUCOSE 73 09/13/2023   GLUCOSE 112 (H) 07/14/2012    We reviewed treatment options which include losing 7 to 10% of body weight, increasing volume of physical activity and maintaining a diet low in saturated fats and with a low glycemic load.  Patient has also been educated on the carb insulin  model of obesity.  She did not tolerate metformin .  Her insurance does not cover GLP-1.  Continue current weight management strategy

## 2024-01-03 NOTE — Assessment & Plan Note (Signed)
 She has mild hepatic steatosis on CT scan from 2023 with normal liver enzymes.  Fibrosis 4 Score = .43  Patient will continue to work on reducing processed foods, simple and added sugars in her diet.  Continue with current weight management strategy

## 2024-01-03 NOTE — Progress Notes (Signed)
 Office: [phone]  /  Fax: [phone]  Weight Summary and Body Composition Analysis (BIA)  Vitals Temp: 98 F (36.7 C) BP: 116/83 Pulse Rate: 86 SpO2: 98 %   Anthropometric Measurements Height: 5' 5 (1.651 m) Weight: 275 lb (124.7 kg) BMI (Calculated): 45.76 Weight at Last Visit: 283 lb Weight Lost Since Last Visit: 8 lb Weight Gained Since Last Visit: 0 lb Starting Weight: 276 lb Total Weight Loss (lbs): 1 lb (0.454 kg) Peak Weight: 283 lb   Body Composition  Body Fat %: 49.9 % Fat Mass (lbs): 137.4 lbs Muscle Mass (lbs): 131 lbs Total Body Water (lbs): 102.4 lbs Visceral Fat Rating : 14    RMR: 2318  Today's Visit #: 21  Starting Date: 04/05/22   Subjective   Chief Complaint: Obesity  Interval History Patient presents today for medical weight management since June she has managed to lose 16 pounds this is the first time since starting her program that she has been losing weight.  She does not know how she is doing it despite trying multiple nutritional approaches.  She again has changed her eating pattern no longer doing soups or salads she got bored of those.  She does use meal replacements and has been relying on some prepackaged foods.  We started her on phentermine  back in June and she denies any adverse effects.  She does note improved appetite and increase sense of satiety.  Challenges affecting patient progress: having difficulty with meal prep and planning, having difficulty focusing on healthy eating, need for convenience or prepackaged foods, limited food variation or intolerances, and low volume of physical activity at present .    Pharmacotherapy for weight management: She is currently taking Phentermine  (longterm use, single agent)  with adequate clinical response  and without side effects..   Assessment and Plan   Treatment Plan For Obesity:  Recommended Dietary Goals  Stacy Mills is currently in the action stage of change. As such, her  goal is to continue weight management plan. She has agreed to: continue current plan and provided with easy to prepare protein Salad ideas  Behavioral Health and Counseling  We discussed the following behavioral modification strategies today: increasing lean protein intake to established goals, decreasing simple carbohydrates , increasing vegetables, increasing fiber rich foods, increasing water intake , and work on meal planning and preparation.  Additional education and resources provided today: Salad recipes that are easy to prepare  Recommended Physical Activity Goals  Stacy Mills has been advised to work up to 150 minutes of moderate intensity aerobic activity a week and strengthening exercises 2-3 times per week for cardiovascular health, weight loss maintenance and preservation of muscle mass.   She has agreed to :  Think about enjoyable ways to increase daily physical activity and overcoming barriers to exercise and Increase physical activity in their day and reduce sedentary time (increase NEAT).  Medical Interventions and Pharmacotherapy  We discussed various medication options to help Stacy Mills with her weight loss efforts and we both agreed to : Adequate clinical response to anti-obesity medication, continue current regimen and heart rate and blood pressure well-controlled no side effects of phentermine  with adequate clinical response continue medication  Associated Conditions Impacted by Obesity Treatment  Assessment & Plan Class 3 severe obesity with serious comorbidity and body mass index (BMI) of 40.0 to 44.9 in adult Stacy Mills has actually been able to lose 16 pounds since June unfortunately she does not know why and her diet continues to change.  She lacks  consistency with her nutrition which makes evaluation and modifications very difficult.  She seems to get bored and has limited food variation.  She also tends to prefer prepackaged foods for convenience.  For antiobesity medication  she is on phentermine  and is having a favorable response from anorexogenic standpoint but I worry about nutritional variation and sustainability of her weight loss under the circumstances.  She also has a low volume of physical activity and has a difficult time overcoming some of the barriers.  We again emphasized the importance of a balanced multifaceted approach for sustainable weight loss. Metabolic dysfunction-associated steatotic liver disease (MASLD) She has mild hepatic steatosis on CT scan from 2023 with normal liver enzymes.  Fibrosis 4 Score = .43  Patient will continue to work on reducing processed foods, simple and added sugars in her diet.  Continue with current weight management strategy  Insulin  resistance Her HOMA-IR is 3.09 which is elevated. Optimal level < 1.9.   This is complex condition associated with genetics, ectopic fat and lifestyle factors. Insulin  resistance may result in increased fat storage, inhibition of the breakdown of fat, cause fluctuations in blood sugar leading to energy crashes and increased cravings for sugary or high carb foods and cause metabolic slowdown making it difficult to lose weight.  This may result in additional weight gain and lead to pre-diabetes and diabetes if untreated. In addition, hyperinsulinemia increases cardiovascular risk, chronic inflammatory response and may increase the risk of obesity related malignancies.  Lab Results  Component Value Date   HGBA1C 5.4 09/13/2023   Lab Results  Component Value Date   INSULIN  18.1 09/13/2023   INSULIN  15.4 03/27/2023   INSULIN  15.5 04/05/2022   Lab Results  Component Value Date   GLUCOSE 73 09/13/2023   GLUCOSE 112 (H) 07/14/2012    We reviewed treatment options which include losing 7 to 10% of body weight, increasing volume of physical activity and maintaining a diet low in saturated fats and with a low glycemic load.  Patient has also been educated on the carb insulin  model of  obesity.  She did not tolerate metformin .  Her insurance does not cover GLP-1.  Continue current weight management strategy       Objective   Physical Exam:  Blood pressure 116/83, pulse 86, temperature 98 F (36.7 C), height 5' 5 (1.651 m), weight 275 lb (124.7 kg), last menstrual period 12/21/2023, SpO2 98%. Body mass index is 45.76 kg/m.  General: She is overweight, cooperative, alert, well developed, and in no acute distress. PSYCH: Has normal mood, affect and thought process.   HEENT: EOMI, sclerae are anicteric. Lungs: Normal breathing effort, no conversational dyspnea. Extremities: No edema.  Neurologic: No gross sensory or motor deficits. No tremors or fasciculations noted.    Diagnostic Data Reviewed:  BMET    Component Value Date/Time   NA 141 09/13/2023 1532   K 4.1 09/13/2023 1532   CL 106 09/13/2023 1532   CO2 22 09/13/2023 1532   GLUCOSE 73 09/13/2023 1532   GLUCOSE 80 09/13/2022 0936   BUN 9 09/13/2023 1532   CREATININE 0.95 09/13/2023 1532   CREATININE 0.98 09/23/2021 1440   CREATININE 0.93 11/05/2012 1015   CALCIUM 9.3 09/13/2023 1532   GFRNONAA >60 09/23/2021 1440   GFRAA >60 05/31/2019 0417   Lab Results  Component Value Date   HGBA1C 5.4 09/13/2023   HGBA1C 5.4 05/09/2016   Lab Results  Component Value Date   INSULIN  18.1 09/13/2023   INSULIN  15.5 04/05/2022  Lab Results  Component Value Date   TSH 1.250 04/05/2022   CBC    Component Value Date/Time   WBC 9.7 09/13/2023 1532   WBC 7.2 09/23/2021 1440   WBC 8.1 09/16/2021 0847   RBC 4.58 09/13/2023 1532   RBC 4.01 09/23/2021 1440   HGB 13.6 09/13/2023 1532   HCT 40.5 09/13/2023 1532   PLT 319 09/13/2023 1532   MCV 88 09/13/2023 1532   MCH 29.7 09/13/2023 1532   MCH 29.9 09/23/2021 1440   MCHC 33.6 09/13/2023 1532   MCHC 33.1 09/23/2021 1440   RDW 11.8 09/13/2023 1532   Iron Studies No results found for: IRON, TIBC, FERRITIN, IRONPCTSAT Lipid Panel      Component Value Date/Time   CHOL 180 09/13/2023 1532   TRIG 48 09/13/2023 1532   HDL 69 09/13/2023 1532   CHOLHDL 3 01/26/2021 1421   VLDL 9.0 01/26/2021 1421   LDLCALC 101 (H) 09/13/2023 1532   Hepatic Function Panel     Component Value Date/Time   PROT 7.0 09/13/2023 1532   ALBUMIN 4.2 09/13/2023 1532   AST 21 09/13/2023 1532   AST 15 09/23/2021 1440   ALT 20 09/13/2023 1532   ALT 12 09/23/2021 1440   ALKPHOS 61 09/13/2023 1532   BILITOT 0.4 09/13/2023 1532   BILITOT 0.4 09/23/2021 1440      Component Value Date/Time   TSH 1.250 04/05/2022 0839   Nutritional Lab Results  Component Value Date   VD25OH 20.1 (L) 09/13/2023   VD25OH 19.8 (L) 03/27/2023   VD25OH 21.7 (L) 07/28/2022    Medications: Outpatient Encounter Medications as of 01/03/2024  Medication Sig   acetaminophen  (TYLENOL ) 325 MG tablet Take 650 mg by mouth every 6 (six) hours as needed for mild pain or moderate pain.   amoxicillin  (AMOXIL ) 500 MG tablet Take 500 mg by mouth 3 (three) times daily.   metroNIDAZOLE  (METROGEL ) 0.75 % vaginal gel Place 1 Applicatorful vaginally 2 (two) times daily.   Vitamin D , Ergocalciferol , (DRISDOL ) 1.25 MG (50000 UNIT) CAPS capsule Take 1 capsule (50,000 Units total) by mouth every 7 (seven) days.   [DISCONTINUED] phentermine  (ADIPEX-P ) 37.5 MG tablet Take 1 tablet (37.5 mg total) by mouth daily before breakfast.   phentermine  (ADIPEX-P ) 37.5 MG tablet Take 1 tablet (37.5 mg total) by mouth daily before breakfast.   No facility-administered encounter medications on file as of 01/03/2024.     Follow-Up   No follow-ups on file.SABRA She was informed of the importance of frequent follow up visits to maximize her success with intensive lifestyle modifications for her multiple health conditions.  Attestation Statement   Reviewed by clinician on day of visit: allergies, medications, problem list, medical history, surgical history, family history, social history, and previous  encounter notes.     Lucas Parker, MD

## 2024-01-03 NOTE — Assessment & Plan Note (Signed)
 Stacy Mills has actually been able to lose 16 pounds since June unfortunately she does not know why and her diet continues to change.  She lacks consistency with her nutrition which makes evaluation and modifications very difficult.  She seems to get bored and has limited food variation.  She also tends to prefer prepackaged foods for convenience.  For antiobesity medication she is on phentermine  and is having a favorable response from anorexogenic standpoint but I worry about nutritional variation and sustainability of her weight loss under the circumstances.  She also has a low volume of physical activity and has a difficult time overcoming some of the barriers.  We again emphasized the importance of a balanced multifaceted approach for sustainable weight loss.

## 2024-01-26 ENCOUNTER — Ambulatory Visit
Admission: RE | Admit: 2024-01-26 | Discharge: 2024-01-26 | Disposition: A | Discharge status: Home / Self Care | Source: Ambulatory Visit | Attending: Nurse Practitioner | Admitting: Nurse Practitioner

## 2024-01-27 ENCOUNTER — Ambulatory Visit

## 2024-02-01 ENCOUNTER — Ambulatory Visit
Admission: RE | Admit: 2024-02-01 | Discharge: 2024-02-01 | Disposition: A | Discharge status: Home / Self Care | Source: Ambulatory Visit | Attending: Physician Assistant | Admitting: Physician Assistant

## 2024-02-01 VITALS — BP 125/88 | HR 68 | Temp 98.0°F | Resp 14

## 2024-02-01 DIAGNOSIS — Z113 Encounter for screening for infections with a predominantly sexual mode of transmission: Secondary | ICD-10-CM | POA: Insufficient documentation

## 2024-02-01 LAB — POCT URINE PREGNANCY: Preg Test, Ur: NEGATIVE

## 2024-02-01 NOTE — ED Provider Notes (Signed)
 [facility]    CSN: [id number] Arrival date & time: 02/01/24  1550      History   Chief Complaint Chief Complaint  Patient presents with   SEXUALLY TRANSMITTED DISEASE    And bv to get tested in general for anything sexual related - Entered by patient    HPI Stacy Mills is a 28 y.o. female.   Patient here today for STD screening.  She would also like pregnancy test if possible.  She denies any symptoms.  She has not had any known exposures.  The history is provided by the patient.    Past Medical History:  Diagnosis Date   Allergy    Anxiety    Back pain    Depression    Gallbladder problem    HSV (herpes simplex virus) anogenital infection    IIH (idiopathic intracranial hypertension)    IUD (intrauterine device) in place    Paraguard placed 2014, removed 10/2015   Migraine    Pelvic inflammation in female    Pelvic inflammatory disease 10/2012    Patient Active Problem List   Diagnosis Date Noted   Metabolic dysfunction-associated steatotic liver disease (MASLD) 09/13/2023   Primary insomnia 03/12/2023   Obesity- Start BMI 44.43 01/12/2023   Abnormal food appetite 07/28/2022   Vitamin D  deficiency 06/23/2022   Insulin  resistance 06/23/2022   Family history of non-Hodgkin's lymphoma 09/09/2021   Lymphadenopathy 09/09/2021   Lower abdominal pain 08/31/2021   Acanthosis nigricans 01/26/2021   Liver abscess 05/30/2019   Genital herpes simplex 06/22/2018   Benign intracranial hypertension 05/19/2018   Migraines 12/22/2014   Hemorrhagic cyst of ovary 11/07/2012   Class 3 severe obesity with serious comorbidity and body mass index (BMI) of 40.0 to 44.9 in adult 08/26/2012    Past Surgical History:  Procedure Laterality Date   CESAREAN SECTION N/A 08/19/2018   Procedure: CESAREAN SECTION;  Surgeon: Armond Cape, MD;  Location: [facility];  Service: Obstetrics;  Laterality: N/A;   CHOLECYSTECTOMY     spinal tap      OB History     Gravida   1   Para  1   Term  1   Preterm      AB  0   Living  1      SAB      IAB      Ectopic  0   Multiple  0   Live Births  1            Home Medications    Prior to Admission medications   Medication Sig Start Date End Date Taking? Authorizing Provider  acetaminophen  (TYLENOL ) 325 MG tablet Take 650 mg by mouth every 6 (six) hours as needed for mild pain or moderate pain.   Yes [provider]  phentermine  (ADIPEX-P ) 37.5 MG tablet Take 1 tablet (37.5 mg total) by mouth daily before breakfast. 01/03/24  Yes Francyne Romano, MD  Vitamin D , Ergocalciferol , (DRISDOL ) 1.25 MG (50000 UNIT) CAPS capsule Take 1 capsule (50,000 Units total) by mouth every 7 (seven) days. 10/30/23  Yes Francyne Romano, MD  amoxicillin  (AMOXIL ) 500 MG tablet Take 500 mg by mouth 3 (three) times daily. Patient not taking: Reported on 02/01/2024 12/29/23   [provider]  metroNIDAZOLE  (METROGEL ) 0.75 % vaginal gel Place 1 Applicatorful vaginally 2 (two) times daily. Patient not taking: Reported on 02/01/2024 11/07/23   Sofia, Leslie K, PA-C    Family History Family History  Problem Relation Age  of Onset   Asthma Mother    High blood pressure Mother    Obesity Mother    Varicose Veins Mother    Hypertension Father    Alcoholism Father    Drug abuse Father    Alcohol abuse Father    Emphysema Maternal Grandfather    Depression Maternal Grandfather    Anxiety disorder Maternal Grandfather    Mental illness Maternal Grandfather    Diabetes Maternal Grandfather    COPD Maternal Grandfather    Cancer Paternal Grandmother    Non-Hodgkin's lymphoma Paternal Grandmother    Sudden death Paternal Grandmother    Early death Paternal Grandmother    Alcohol abuse Maternal Aunt    Hyperlipidemia Maternal Aunt    Anxiety disorder Sister    Anxiety disorder Son    Asthma Son     Social History Social History   Tobacco Use   Smoking status: Former   Smokeless tobacco: Never   Vaping Use   Vaping status: Never Used  Substance Use Topics   Alcohol use: Yes    Alcohol/week: 2.0 standard drinks of alcohol    Types: 2 Shots of liquor per week    Comment: social   Drug use: Not Currently    Types: Marijuana     Allergies   Ivp dye [iodinated contrast media]   Review of Systems Review of Systems  Constitutional:  Negative for chills and fever.  Eyes:  Negative for discharge and redness.  Respiratory:  Negative for shortness of breath.   Gastrointestinal:  Negative for abdominal pain, nausea and vomiting.  Genitourinary:  Negative for genital sores and vaginal discharge.     Physical Exam Triage Vital Signs ED Triage Vitals  Encounter Vitals Group     BP 02/01/24 1638 125/88     Girls Systolic BP Percentile --      Girls Diastolic BP Percentile --      Boys Systolic BP Percentile --      Boys Diastolic BP Percentile --      Pulse Rate 02/01/24 1638 68     Resp 02/01/24 1638 14     Temp 02/01/24 1638 98 F (36.7 C)     Temp Source 02/01/24 1638 Oral     SpO2 02/01/24 1638 98 %     Weight --      Height --      Head Circumference --      Peak Flow --      Pain Score 02/01/24 1639 0     Pain Loc --      Pain Education --      Exclude from Growth Chart --    No data found.  Updated Vital Signs BP 125/88 (BP Location: Left Arm)   Pulse 68   Temp 98 F (36.7 C) (Oral)   Resp 14   LMP 01/06/2024 (Exact Date)   SpO2 98%   Visual Acuity Right Eye Distance:   Left Eye Distance:   Bilateral Distance:    Right Eye Near:   Left Eye Near:    Bilateral Near:     Physical Exam Vitals and nursing note reviewed.  Constitutional:      General: She is not in acute distress.    Appearance: Normal appearance. She is not ill-appearing.  HENT:     Head: Normocephalic and atraumatic.  Eyes:     Conjunctiva/sclera: Conjunctivae normal.  Cardiovascular:     Rate and Rhythm: Normal rate.  Pulmonary:  Effort: Pulmonary effort is normal.  No respiratory distress.  Neurological:     Mental Status: She is alert.  Psychiatric:        Mood and Affect: Mood normal.        Behavior: Behavior normal.        Thought Content: Thought content normal.      UC Treatments / Results  Labs (all labs ordered are listed, but only abnormal results are displayed) Labs Reviewed  POCT URINE PREGNANCY - Normal  RPR  HIV ANTIBODY (ROUTINE TESTING W REFLEX)  CERVICOVAGINAL ANCILLARY ONLY    EKG   Radiology No results found.  Procedures Procedures (including critical care time)  Medications Ordered in UC Medications - No data to display  Initial Impression / Assessment and Plan / UC Course  I have reviewed the triage vital signs and the nursing notes.  Pertinent labs & imaging results that were available during my care of the patient were reviewed by me and considered in my medical decision making (see chart for details).    Pregnancy test negative.  STD screening ordered as requested.  Will await results further recommendation.  Advised abstain from sexual activity while awaiting results.   Final Clinical Impressions(s) / UC Diagnoses   Final diagnoses:  Screening for STD (sexually transmitted disease)   Discharge Instructions   None    ED Prescriptions   None    PDMP not reviewed this encounter.   Billy Asberry FALCON, PA-C 02/01/24 [phone]

## 2024-02-01 NOTE — ED Triage Notes (Signed)
 Pt here for routine STD screening - cytology swab and blood work. Asymptomatic and no known exposures. Pt also asks if she can have a pregnancy test completed.

## 2024-02-02 LAB — SYPHILIS: RPR W/REFLEX TO RPR TITER AND TREPONEMAL ANTIBODIES, TRADITIONAL SCREENING AND DIAGNOSIS ALGORITHM: RPR Ser Ql: NONREACTIVE

## 2024-02-02 LAB — CERVICOVAGINAL ANCILLARY ONLY
Chlamydia: NEGATIVE
Comment: NEGATIVE
Comment: NEGATIVE
Comment: NORMAL
Neisseria Gonorrhea: NEGATIVE
Trichomonas: NEGATIVE

## 2024-02-02 LAB — HIV ANTIBODY (ROUTINE TESTING W REFLEX): HIV Screen 4th Generation wRfx: NONREACTIVE

## 2024-02-13 ENCOUNTER — Ambulatory Visit (INDEPENDENT_AMBULATORY_CARE_PROVIDER_SITE_OTHER): Admitting: Internal Medicine

## 2024-02-13 ENCOUNTER — Encounter (INDEPENDENT_AMBULATORY_CARE_PROVIDER_SITE_OTHER): Payer: Self-pay | Admitting: Internal Medicine

## 2024-02-13 VITALS — BP 133/81 | HR 81 | Temp 97.8°F | Ht 65.0 in | Wt 263.0 lb

## 2024-02-13 DIAGNOSIS — E88819 Insulin resistance, unspecified: Secondary | ICD-10-CM

## 2024-02-13 DIAGNOSIS — E66813 Obesity, class 3: Secondary | ICD-10-CM

## 2024-02-13 DIAGNOSIS — E559 Vitamin D deficiency, unspecified: Secondary | ICD-10-CM

## 2024-02-13 DIAGNOSIS — Z6841 Body Mass Index (BMI) 40.0 and over, adult: Secondary | ICD-10-CM

## 2024-02-13 DIAGNOSIS — K760 Fatty (change of) liver, not elsewhere classified: Secondary | ICD-10-CM

## 2024-02-13 DIAGNOSIS — R638 Other symptoms and signs concerning food and fluid intake: Secondary | ICD-10-CM | POA: Diagnosis not present

## 2024-02-13 MED ORDER — VITAMIN D (ERGOCALCIFEROL) 1.25 MG (50000 UNIT) PO CAPS: 50000.0000 [IU] | ORAL_CAPSULE | ORAL | 0 refills | Status: AC

## 2024-02-13 MED ORDER — PHENTERMINE HCL 37.5 MG PO TABS: 37.5000 mg | ORAL_TABLET | Freq: Every day | ORAL | 0 refills | Status: DC

## 2024-02-13 NOTE — Assessment & Plan Note (Signed)
 Her HOMA-IR is 3.09 which is elevated. Optimal level < 1.9.   This is complex condition associated with genetics, ectopic fat and lifestyle factors. Insulin  resistance may result in increased fat storage, inhibition of the breakdown of fat, cause fluctuations in blood sugar leading to energy crashes and increased cravings for sugary or high carb foods and cause metabolic slowdown making it difficult to lose weight.  This may result in additional weight gain and lead to pre-diabetes and diabetes if untreated. In addition, hyperinsulinemia increases cardiovascular risk, chronic inflammatory response and may increase the risk of obesity related malignancies.  Lab Results  Component Value Date   HGBA1C 5.4 09/13/2023   Lab Results  Component Value Date   INSULIN  18.1 09/13/2023   INSULIN  15.4 03/27/2023   INSULIN  15.5 04/05/2022   Lab Results  Component Value Date   GLUCOSE 73 09/13/2023   GLUCOSE 112 (H) 07/14/2012    We reviewed treatment options which include losing 7 to 10% of body weight, increasing volume of physical activity and maintaining a diet low in saturated fats and with a low glycemic load.  Patient has also been educated on the carb insulin  model of obesity.  She did not tolerate metformin .  Her insurance does not cover GLP-1.  Continue current weight management strategy

## 2024-02-13 NOTE — Assessment & Plan Note (Signed)
 She has mild hepatic steatosis on CT scan from 2023 with normal liver enzymes.  Fibrosis 4 Score = .43  Patient will continue to work on reducing processed foods, simple and added sugars in her diet.  Continue with current weight management strategy Has lost 10% of body weight

## 2024-02-13 NOTE — Assessment & Plan Note (Addendum)
 Weight: decrease of 30.4 lb (10.4%) over 6 months  Start: 08/14/2023 293 lb 6.4 oz (133.1 kg)  End: 02/13/2024 263 lb (119.3 kg)  Significant weight loss of 30 pounds since March, representing a 10.4% reduction in body weight. Current BMI is 43. Weight loss attributed to portion control, healthier food choices, increased protein intake, and regular exercise. Phentermine  aids in appetite suppression, but there is a risk of muscle mass loss due to insufficient protein intake and meal skipping. - Encourage consumption of protein snacks instead of skipping meals to prevent muscle loss. - Advise on protein-rich foods such as [place] yogurt, protein shakes, chicken, and fish. - Recommend increasing exercise to three days a week, including full-body workouts. - Refill phentermine  prescription.  Experiencing dry mouth, some insomnia as side effects of phentermine . Dry mouth is significant, and insomnia is noted but not severe enough to warrant dose adjustment at this time.  - Advise drinking water and using sugar-free lozenges for dry mouth. - Recommend Biotene lozenges or artificial saliva spray for dry mouth. - Suggest reducing caffeine intake to manage jitteriness. - Monitor sleep patterns and consider dose adjustment if insomnia worsens.

## 2024-02-13 NOTE — Assessment & Plan Note (Signed)
 Continue high-dose vitamin D  she did not do well with over-the-counter supplementation.  Repeat vitamin D  levels in 4 months

## 2024-02-13 NOTE — Assessment & Plan Note (Signed)
 Improved on phentermine .  Heart rate and blood pressure control are within normal range continue medication

## 2024-02-13 NOTE — Progress Notes (Signed)
 Office: [phone]  /  Fax: [phone]  Weight Summary and Body Composition Analysis (BIA)  Vitals Temp: 97.8 F (36.6 C) BP: 133/81 Pulse Rate: 81 SpO2: 98 %   Anthropometric Measurements Height: 5' 5 (1.651 m) Weight: 263 lb (119.3 kg) BMI (Calculated): 43.77 Weight at Last Visit: 275 lb Weight Lost Since Last Visit: 12 lb Weight Gained Since Last Visit: 0 lb Starting Weight: 276 lb Total Weight Loss (lbs): 13 lb (5.897 kg) Peak Weight: 283 lb   Body Composition  Body Fat %: 48.4 % Fat Mass (lbs): 127.6 lbs Muscle Mass (lbs): 129.4 lbs Total Body Water (lbs): 99 lbs Visceral Fat Rating : 13    RMR: 2318  No data recorded No data recorded  Subjective   Chief Complaint: Obesity  Interval History Discussed the use of AI scribe software for clinical note transcription with the patient, who gave verbal consent to proceed.  History of Present Illness Stacy Mills is a 28 year old female who presents for medical weight management follow-up.  Since her last visit, she has lost twelve pounds, achieving a total weight loss of approximately ten percent of her body weight since starting phentermine . She attributes her success to taking the medication more seriously, although she sometimes skips doses on weekends or when in a rush. She experiences dry mouth and occasional jitteriness, especially at work, and has been reducing her caffeine intake by switching to decaf coffee. Her appetite has decreased, and she often feels full after breakfast, leading her to skip lunch about three times a week.  She is incorporating portion control and healthier food choices into her diet, focusing on whole foods and ensuring she gets the recommended amount of protein. She exercises two days a week for thirty minutes, engaging in walking and weightlifting. She has cut pork from her diet and is trying to increase her protein intake through various sources like [place] yogurt, nuts, and  meats, although she lacks a scale to measure portions accurately.  She reports occasional difficulty sleeping, which she attributes to a side effect of her medication. She takes her medication early in the morning, around 6:30 or 7:00 AM, to mitigate this issue. She works approximately 38 to 40 hours a week and typically eats breakfast and lunch at work, with dinner at home. She ensures she consumes fruits and vegetables daily, aiming for five servings per day.     Challenges affecting patient progress: having difficulty with meal prep and planning, low volume of physical activity at present , and skipping of meals.    Pharmacotherapy for weight management: She is currently taking Phentermine  (longterm use, off-label, single agent)  with adequate clinical response  and experiencing the following side effects: Dry mouth.   Assessment and Plan   Treatment Plan For Obesity:  Recommended Dietary Goals  Stacy Mills is currently in the action stage of change. As such, her goal is to continue weight management plan. She has agreed to: continue current plan  Behavioral Health and Counseling  We discussed the following behavioral modification strategies today: continue to work on maintaining a reduced calorie state, getting the recommended amount of protein, incorporating whole foods, making healthy choices, staying well hydrated and practicing mindfulness when eating. and increase protein intake, fibrous foods (25 grams per day for women, 30 grams for men) and water to improve satiety and decrease hunger signals. .  Additional education and resources provided today: None  Recommended Physical Activity Goals  Stacy Mills has been advised to work up  to 150 minutes of moderate intensity aerobic activity a week and strengthening exercises 2-3 times per week for cardiovascular health, weight loss maintenance and preservation of muscle mass.  She has agreed to :  Think about enjoyable ways to increase daily  physical activity and overcoming barriers to exercise, Increase physical activity in their day and reduce sedentary time (increase NEAT)., Increase volume of physical activity to a goal of 240 minutes a week, and Combine aerobic and strengthening exercises for efficiency and improved cardiometabolic health.  Medical Interventions and Pharmacotherapy  We discussed various medication options to help Stacy Mills with her weight loss efforts and we both agreed to : Adequate clinical response to anti-obesity medication, continue current regimen and counseled on avoiding stimulants. BP and HR within normal parametrs  Associated Conditions Impacted by Obesity Treatment  Assessment & Plan Class 3 severe obesity with serious comorbidity and body mass index (BMI) of 40.0 to 44.9 in adult Weight: decrease of 30.4 lb (10.4%) over 6 months  Start: 08/14/2023 293 lb 6.4 oz (133.1 kg)  End: 02/13/2024 263 lb (119.3 kg)  Significant weight loss of 30 pounds since March, representing a 10.4% reduction in body weight. Current BMI is 43. Weight loss attributed to portion control, healthier food choices, increased protein intake, and regular exercise. Phentermine  aids in appetite suppression, but there is a risk of muscle mass loss due to insufficient protein intake and meal skipping. - Encourage consumption of protein snacks instead of skipping meals to prevent muscle loss. - Advise on protein-rich foods such as [place] yogurt, protein shakes, chicken, and fish. - Recommend increasing exercise to three days a week, including full-body workouts. - Refill phentermine  prescription.  Experiencing dry mouth, some insomnia as side effects of phentermine . Dry mouth is significant, and insomnia is noted but not severe enough to warrant dose adjustment at this time.  - Advise drinking water and using sugar-free lozenges for dry mouth. - Recommend Biotene lozenges or artificial saliva spray for dry mouth. - Suggest reducing  caffeine intake to manage jitteriness. - Monitor sleep patterns and consider dose adjustment if insomnia worsens. Metabolic dysfunction-associated steatotic liver disease (MASLD) She has mild hepatic steatosis on CT scan from 2023 with normal liver enzymes.  Fibrosis 4 Score = .43  Patient will continue to work on reducing processed foods, simple and added sugars in her diet.  Continue with current weight management strategy Has lost 10% of body weight  Insulin  resistance Her HOMA-IR is 3.09 which is elevated. Optimal level < 1.9.   This is complex condition associated with genetics, ectopic fat and lifestyle factors. Insulin  resistance may result in increased fat storage, inhibition of the breakdown of fat, cause fluctuations in blood sugar leading to energy crashes and increased cravings for sugary or high carb foods and cause metabolic slowdown making it difficult to lose weight.  This may result in additional weight gain and lead to pre-diabetes and diabetes if untreated. In addition, hyperinsulinemia increases cardiovascular risk, chronic inflammatory response and may increase the risk of obesity related malignancies.  Lab Results  Component Value Date   HGBA1C 5.4 09/13/2023   Lab Results  Component Value Date   INSULIN  18.1 09/13/2023   INSULIN  15.4 03/27/2023   INSULIN  15.5 04/05/2022   Lab Results  Component Value Date   GLUCOSE 73 09/13/2023   GLUCOSE 112 (H) 07/14/2012    We reviewed treatment options which include losing 7 to 10% of body weight, increasing volume of physical activity and maintaining a diet low  in saturated fats and with a low glycemic load.  Patient has also been educated on the carb insulin  model of obesity.  She did not tolerate metformin .  Her insurance does not cover GLP-1.  Continue current weight management strategy  Abnormal food appetite Improved on phentermine .  Heart rate and blood pressure control are within normal range continue  medication Vitamin D  deficiency Continue high-dose vitamin D  she did not do well with over-the-counter supplementation.  Repeat vitamin D  levels in 4 months    Assessment and Plan Assessment & Plan Obesity and medical weight management   Metabolic dysfunction-associated steatotic liver disease (MASLD) managed with weight loss MASLD is managed through medical weight loss. She has lost approximately 10% of body weight since starting phentermine , which is beneficial for managing MASLD.  Adverse effects of phentermine  (dry mouth, insomnia, jitteriness)   Recording duration: 13 minutes      Objective   Physical Exam:  Blood pressure 133/81, pulse 81, temperature 97.8 F (36.6 C), height 5' 5 (1.651 m), weight 263 lb (119.3 kg), last menstrual period 02/09/2024, SpO2 98%. Body mass index is 43.77 kg/m.  General: She is overweight, cooperative, alert, well developed, and in no acute distress. PSYCH: Has normal mood, affect and thought process.   HEENT: EOMI, sclerae are anicteric. Lungs: Normal breathing effort, no conversational dyspnea. Extremities: No edema.  Neurologic: No gross sensory or motor deficits. No tremors or fasciculations noted.    Diagnostic Data Reviewed:  BMET    Component Value Date/Time   NA 141 09/13/2023 1532   K 4.1 09/13/2023 1532   CL 106 09/13/2023 1532   CO2 22 09/13/2023 1532   GLUCOSE 73 09/13/2023 1532   GLUCOSE 80 09/13/2022 0936   BUN 9 09/13/2023 1532   CREATININE 0.95 09/13/2023 1532   CREATININE 0.98 09/23/2021 1440   CREATININE 0.93 11/05/2012 1015   CALCIUM 9.3 09/13/2023 1532   GFRNONAA >60 09/23/2021 1440   GFRAA >60 05/31/2019 0417   Lab Results  Component Value Date   HGBA1C 5.4 09/13/2023   HGBA1C 5.4 05/09/2016   Lab Results  Component Value Date   INSULIN  18.1 09/13/2023   INSULIN  15.5 04/05/2022   Lab Results  Component Value Date   TSH 1.250 04/05/2022   CBC    Component Value Date/Time   WBC 9.7  09/13/2023 1532   WBC 7.2 09/23/2021 1440   WBC 8.1 09/16/2021 0847   RBC 4.58 09/13/2023 1532   RBC 4.01 09/23/2021 1440   HGB 13.6 09/13/2023 1532   HCT 40.5 09/13/2023 1532   PLT 319 09/13/2023 1532   MCV 88 09/13/2023 1532   MCH 29.7 09/13/2023 1532   MCH 29.9 09/23/2021 1440   MCHC 33.6 09/13/2023 1532   MCHC 33.1 09/23/2021 1440   RDW 11.8 09/13/2023 1532   Iron Studies No results found for: IRON, TIBC, FERRITIN, IRONPCTSAT Lipid Panel     Component Value Date/Time   CHOL 180 09/13/2023 1532   TRIG 48 09/13/2023 1532   HDL 69 09/13/2023 1532   CHOLHDL 3 01/26/2021 1421   VLDL 9.0 01/26/2021 1421   LDLCALC 101 (H) 09/13/2023 1532   Hepatic Function Panel     Component Value Date/Time   PROT 7.0 09/13/2023 1532   ALBUMIN 4.2 09/13/2023 1532   AST 21 09/13/2023 1532   AST 15 09/23/2021 1440   ALT 20 09/13/2023 1532   ALT 12 09/23/2021 1440   ALKPHOS 61 09/13/2023 1532   BILITOT 0.4 09/13/2023 1532  BILITOT 0.4 09/23/2021 1440      Component Value Date/Time   TSH 1.250 04/05/2022 0839   Nutritional Lab Results  Component Value Date   VD25OH 20.1 (L) 09/13/2023   VD25OH 19.8 (L) 03/27/2023   VD25OH 21.7 (L) 07/28/2022    Medications: Outpatient Encounter Medications as of 02/13/2024  Medication Sig   acetaminophen  (TYLENOL ) 325 MG tablet Take 650 mg by mouth every 6 (six) hours as needed for mild pain or moderate pain.   amoxicillin  (AMOXIL ) 500 MG tablet Take 500 mg by mouth 3 (three) times daily.   [DISCONTINUED] Vitamin D , Ergocalciferol , (DRISDOL ) 1.25 MG (50000 UNIT) CAPS capsule Take 1 capsule (50,000 Units total) by mouth every 7 (seven) days.   metroNIDAZOLE  (METROGEL ) 0.75 % vaginal gel Place 1 Applicatorful vaginally 2 (two) times daily. (Patient not taking: Reported on 02/01/2024)   phentermine  (ADIPEX-P ) 37.5 MG tablet Take 1 tablet (37.5 mg total) by mouth daily before breakfast.   Vitamin D , Ergocalciferol , (DRISDOL ) 1.25 MG (50000  UNIT) CAPS capsule Take 1 capsule (50,000 Units total) by mouth every 7 (seven) days.   [DISCONTINUED] phentermine  (ADIPEX-P ) 37.5 MG tablet Take 1 tablet (37.5 mg total) by mouth daily before breakfast.   No facility-administered encounter medications on file as of 02/13/2024.     Follow-Up   Return in about 4 weeks (around 03/12/2024) for For Weight Mangement with Dr. Francyne.Stacy Mills She was informed of the importance of frequent follow up visits to maximize her success with intensive lifestyle modifications for her multiple health conditions.  Attestation Statement   Reviewed by clinician on day of visit: allergies, medications, problem list, medical history, surgical history, family history, social history, and previous encounter notes.     Lucas Francyne, MD

## 2024-02-15 IMAGING — CT CT ABD-PELV W/O CM
2 of 4 series · 15 of 46 positions shown, 17 images · non-contrast
Comparison: CT with IV contrast 05/30/2019

CLINICAL DATA: Radiating lower abdominal pain with nausea. UTI
diagnosed last week. History of PID and cholecystectomy.



[Series 2: axial st · axial · 0.68mm/px · z∈[+1173,+1578]mm · 12 of 91 slices shown, 14 images]
[im 5/91  soft-tissue]
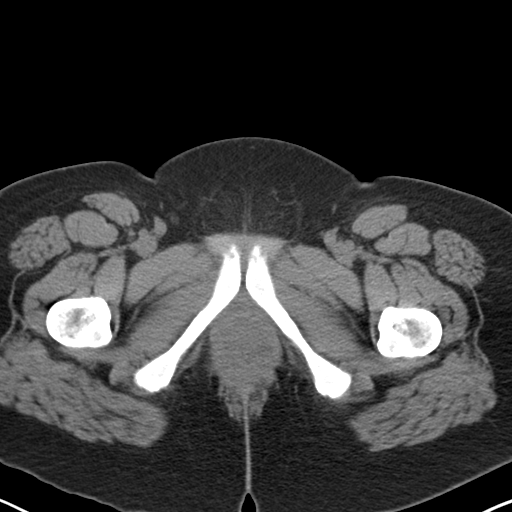
[im 5/91  bone]
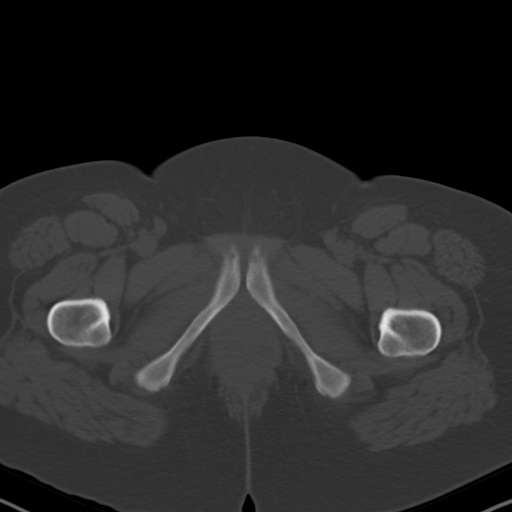
[im 14/91  soft-tissue]
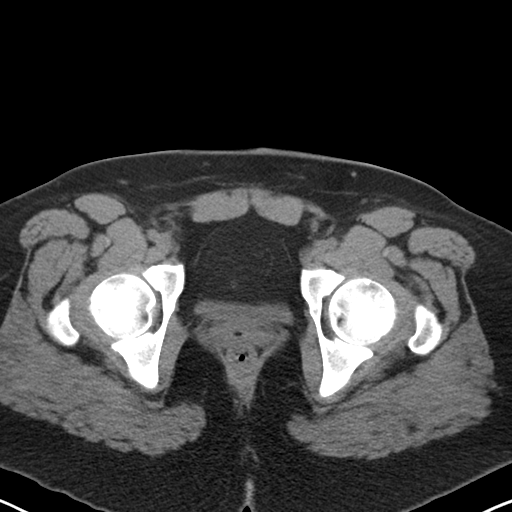
[im 19/91  soft-tissue]
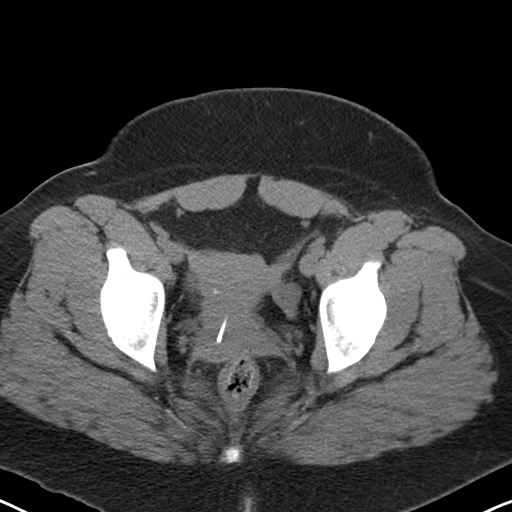
[im 28/91  soft-tissue]
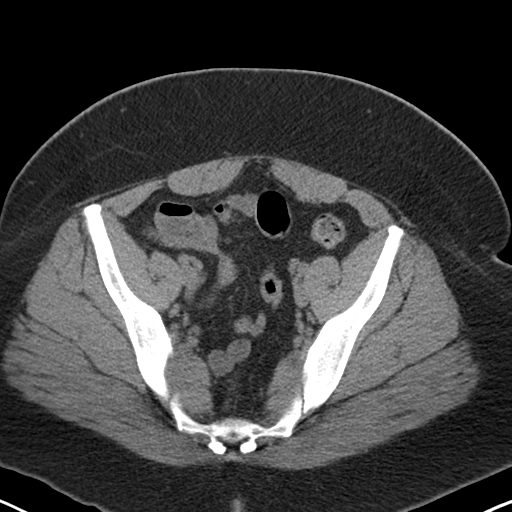
[im 37/91  soft-tissue]
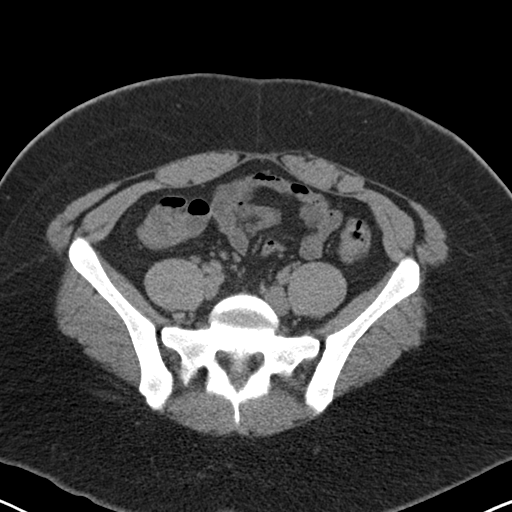
[im 41/91  soft-tissue]
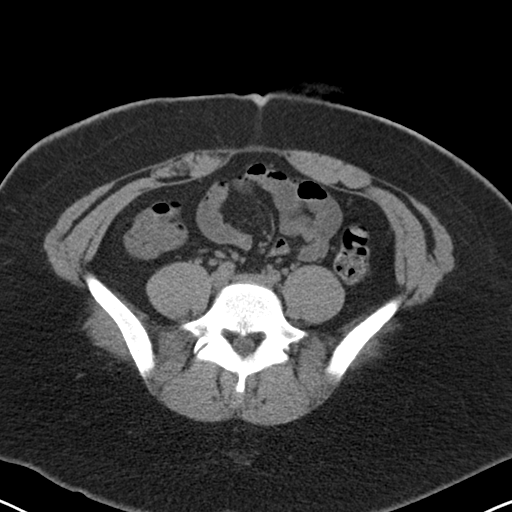
[im 50/91  soft-tissue]
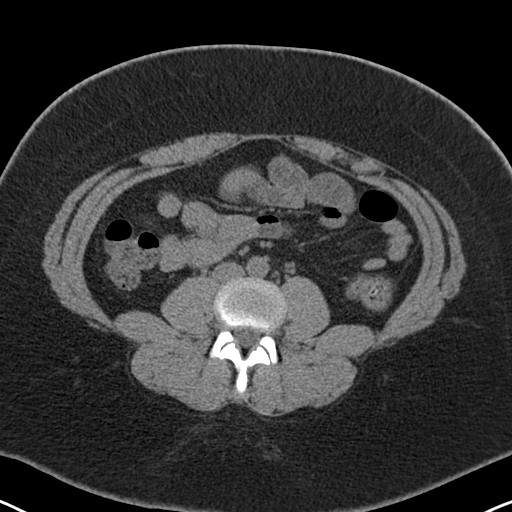
[im 55/91  soft-tissue]
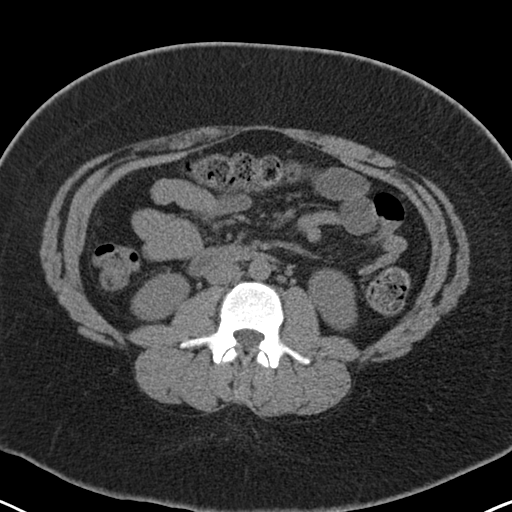
[im 64/91  soft-tissue]
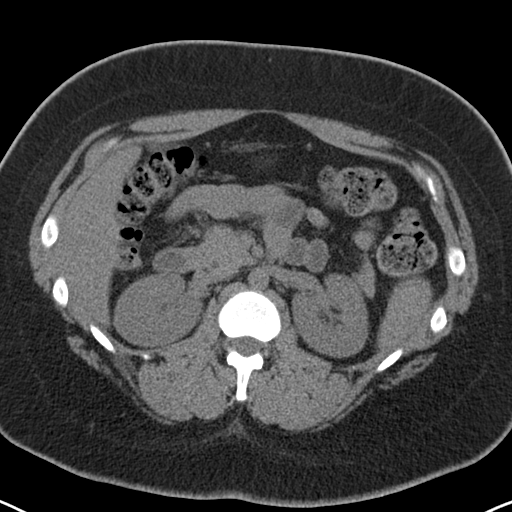
[im 64/91  bone]
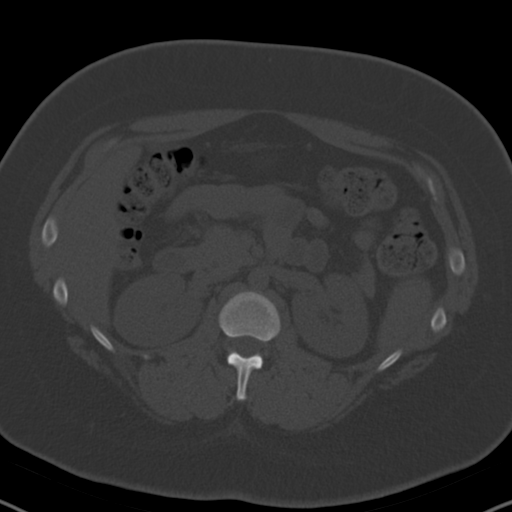
[im 73/91  soft-tissue]
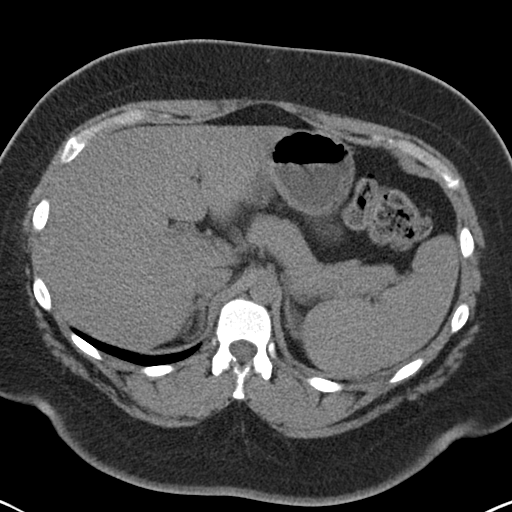
[im 77/91  soft-tissue]
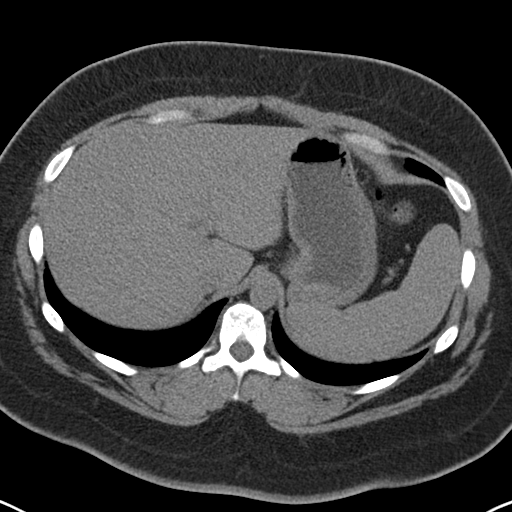
[im 86/91  soft-tissue]
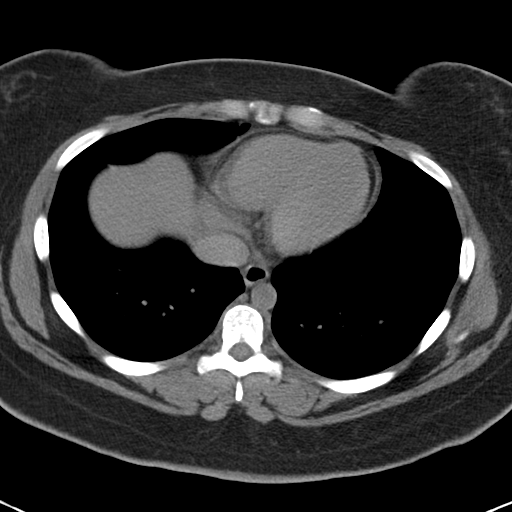

[Series 3: coronal st · coronal · 0.74mm/px · 3 of 163 slices shown]
[im 55/163  soft-tissue]
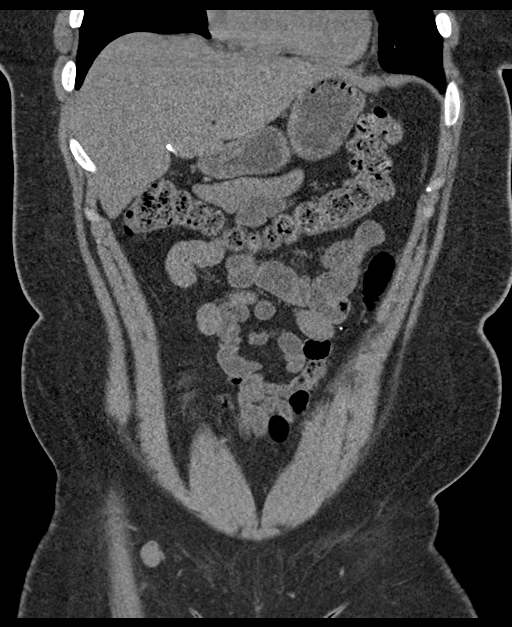
[im 73/163  soft-tissue]
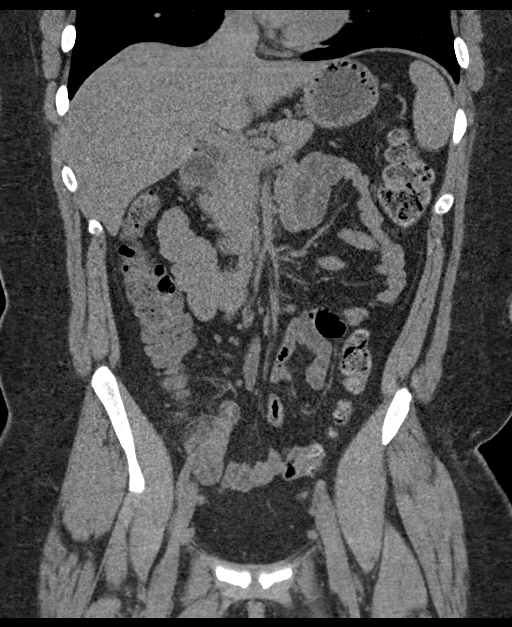
[im 91/163  soft-tissue]
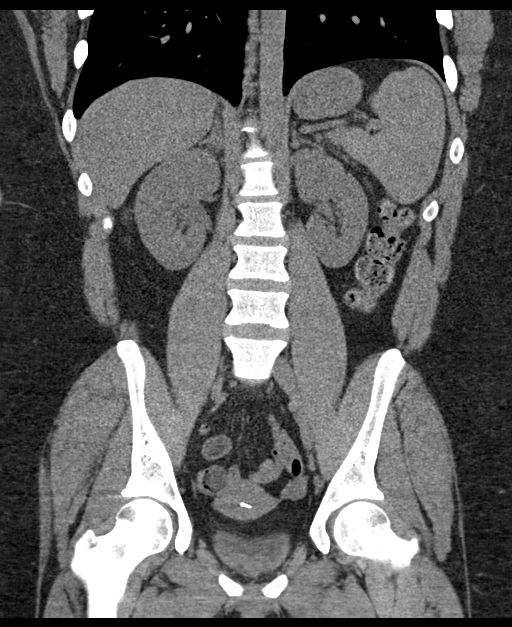

[15 of 46 positions shown; findings below may reference images not displayed]

FINDINGS: Lower chest: No acute abnormality.

Hepatobiliary: Interval cholecystectomy. Interval removal of
percutaneously inserted biliary drainage catheter. Interval
resolution of 3 cm collection in the posterior segment of the right
lobe of the liver.

The common bile duct is mildly prominent measuring 7.5 mm but this
was also seen previously. There is no intrahepatic biliary
prominence. The liver is mildly steatotic and otherwise unremarkable
without contrast.

Pancreas: Unremarkable without contrast.

Spleen: Normal in size, unremarkable without contrast.

Adrenals/Urinary Tract: There is no adrenal mass, no focal
abnormality in the unenhanced renal cortex and no urinary stone or
obstruction. The bladder is contracted and not well seen.

Stomach/Bowel: Unremarkable stomach, unopacified small bowel. The
appendix is normal caliber. There is mild-to-moderate fecal stasis
in the flexures and transverse colon. There is no colonic
thickening. There are few uncomplicated sigmoid diverticula.

Vascular/Lymphatic: No significant vascular findings are present. No
enlarged abdominal or pelvic lymph nodes. There are bilateral
slightly prominent inguinal lymph nodes up to 1.1 cm in short axis.
These were previously smaller.

Reproductive: The uterus and ovaries are normal in size. IUD is
again low in positioning within the uterine cavity but unchanged.

Other: Small umbilical fat hernia. No free air, hemorrhage or fluid.

Musculoskeletal: No acute or significant osseous findings.
IMPRESSION: 1. No acute noncontrast CT abnormalities.
2. Mildly prominent common bile duct with interval cholecystectomy,
but similar to prior study before cholecystectomy. Laboratory and
clinical correlation suggested.
3. Mild hepatic steatosis.
4. Constipation.  Uncomplicated diverticulosis.
5. IUD positioned low in the uterine cavity but unchanged.
6. Umbilical fat hernia.
7. Bilateral mildly prominent inguinal chain lymph nodes ,
nonspecific. No bulky or encasing adenopathy.

## 2024-03-12 ENCOUNTER — Ambulatory Visit (INDEPENDENT_AMBULATORY_CARE_PROVIDER_SITE_OTHER): Admitting: Internal Medicine

## 2024-03-12 ENCOUNTER — Encounter (INDEPENDENT_AMBULATORY_CARE_PROVIDER_SITE_OTHER): Payer: Self-pay | Admitting: Internal Medicine

## 2024-03-12 VITALS — BP 125/87 | HR 81 | Temp 98.0°F | Ht 65.0 in | Wt 257.0 lb

## 2024-03-12 DIAGNOSIS — R682 Dry mouth, unspecified: Secondary | ICD-10-CM | POA: Insufficient documentation

## 2024-03-12 DIAGNOSIS — E88819 Insulin resistance, unspecified: Secondary | ICD-10-CM

## 2024-03-12 DIAGNOSIS — Z6841 Body Mass Index (BMI) 40.0 and over, adult: Secondary | ICD-10-CM

## 2024-03-12 DIAGNOSIS — E66813 Obesity, class 3: Secondary | ICD-10-CM

## 2024-03-12 NOTE — Assessment & Plan Note (Signed)
 Her HOMA-IR is 3.09 which is elevated. Optimal level < 1.9.   This is complex condition associated with genetics, ectopic fat and lifestyle factors. Insulin  resistance may result in increased fat storage, inhibition of the breakdown of fat, cause fluctuations in blood sugar leading to energy crashes and increased cravings for sugary or high carb foods and cause metabolic slowdown making it difficult to lose weight.  This may result in additional weight gain and lead to pre-diabetes and diabetes if untreated. In addition, hyperinsulinemia increases cardiovascular risk, chronic inflammatory response and may increase the risk of obesity related malignancies.  Lab Results  Component Value Date   HGBA1C 5.4 09/13/2023   Lab Results  Component Value Date   INSULIN  18.1 09/13/2023   INSULIN  15.4 03/27/2023   INSULIN  15.5 04/05/2022   Lab Results  Component Value Date   GLUCOSE 73 09/13/2023   GLUCOSE 112 (H) 07/14/2012   She has been educated on the carbohydrate insulin  model for obesity.  She will continue to work on reducing simple and added sugars in her diet.  She did not tolerate metformin  in the past.

## 2024-03-12 NOTE — Assessment & Plan Note (Signed)
 Obesity with insulin  resistance and MASLD Stacy Mills has maintained her weight since the last visit, with no gain or loss. She has been on phentermine  37.5 mg since April for weight management. She follows a 1500 calorie nutrition plan 70% of the time, but has not been exercising. She reports increased cravings for sweets, particularly during her menstrual cycle, which may be contributing to her weight maintenance. Insulin  resistance is present, with insulin  levels three times higher than normal, promoting weight gain. Emphasized the importance of controlling sugar intake to manage insulin  levels and prevent weight gain. Highlighted the need for exercise to aid in weight loss and improve overall health, explaining the benefits of exercise on insulin  sensitivity, inflammation, and stress regulation. Discussed the need for at least a 5% weight loss in six months to continue phentermine  therapy. - Continue phentermine  37.5 mg as blood pressure and heart rate are well-controlled. - Provide handout on emotional hunger from the [facility]. - Encourage reduction of sweets and control of environment to avoid temptation. - Advise tracking steps at work to assess daily activity level. - Encourage exercise, starting with walking once a week, and gradually increasing. - Reassess weight loss progress in four weeks.

## 2024-03-12 NOTE — Progress Notes (Signed)
 Office: [phone]  /  Fax: [phone]  Weight Summary and Body Composition Analysis (BIA)  Vitals Temp: 98 F (36.7 C) BP: 125/87 Pulse Rate: 81 SpO2: 100 %   Anthropometric Measurements Height: 5' 5 (1.651 m) Weight: 257 lb (116.6 kg) BMI (Calculated): 42.77 Weight at Last Visit: 263 lb Weight Lost Since Last Visit: 0 lb Weight Gained Since Last Visit: 0 lb Starting Weight: 276 lb Total Weight Loss (lbs): 13 lb (5.897 kg) Peak Weight: 283 lb   Body Composition  Body Fat %: 49.2 % Fat Mass (lbs): 126.6 lbs Muscle Mass (lbs): 124.2 lbs Total Body Water (lbs): 101.6 lbs Visceral Fat Rating : 13    RMR: 2318  Today's Visit #: 23  Starting Date: 04/05/22   Subjective   Chief Complaint: Obesity  Interval History Discussed the use of AI scribe software for clinical note transcription with the patient, who gave verbal consent to proceed.  History of Present Illness Stacy Mills is a 28 year old female with MASLD and insulin  resistance who presents for medical weight management.  She has been on phentermine  37.5 mg since April for weight management. Since her last visit, she has maintained her weight. She follows a 1500 calorie nutrition plan about 70% of the time, consumes more whole foods, gets the recommended amount of protein, and occasionally skips meals. She experiences dry mouth and feels fuller after meals, with reduced hunger before the next meal.  She works full-time at a nursing home, which involves physical activity, but she does not engage in regular exercise outside of work. She reports adequate sleep but has not been exercising regularly. She experienced a period of weight loss, having lost 13 pounds in a month previously, but her weight has recently plateaued. She attributes this to increased sugar cravings, particularly during her menstrual cycle, leading to consumption of sweets like cookies and Tootsie Rolls. She acknowledges that stress,  possibly related to her sister's health problems, may have triggered these cravings.     Challenges affecting patient progress: low volume of physical activity at present  and emotional eating.    Pharmacotherapy for weight management: She is currently taking Phentermine  (longterm use, off-label, single agent)  with adequate clinical response  and without side effects..   Assessment and Plan   Treatment Plan For Obesity:  Recommended Dietary Goals  Stacy Mills is currently in the action stage of change. As such, her goal is to continue weight management plan. She has agreed to: continue current plan  Behavioral Health and Counseling  We discussed the following behavioral modification strategies today: decreasing simple carbohydrates , avoiding temptations and identifying enticing environmental cues, and better snacking choices.  Additional education and resources provided today: Handout on emotional hunger.  Handout on the benefits of exercise  Recommended Physical Activity Goals  Stacy Mills has been advised to work up to 150 minutes of moderate intensity aerobic activity a week and strengthening exercises 2-3 times per week for cardiovascular health, weight loss maintenance and preservation of muscle mass.  She has agreed to :  Think about enjoyable ways to increase daily physical activity and overcoming barriers to exercise and Increase physical activity in their day and reduce sedentary time (increase NEAT).  Educated on the benefits of regular physical activity body functions  Medical Interventions and Pharmacotherapy  We discussed various medication options to help Stacy Mills with her weight loss efforts and we both agreed to : Adequate clinical response to anti-obesity medication, continue current regimen  Associated Conditions Impacted  by Obesity Treatment  Assessment & Plan Class 3 severe obesity with serious comorbidity and body mass index (BMI) of 40.0 to 44.9 in adult  [facility]) Obesity with insulin  resistance and MASLD Stacy Mills has maintained her weight since the last visit, with no gain or loss. She has been on phentermine  37.5 mg since April for weight management. She follows a 1500 calorie nutrition plan 70% of the time, but has not been exercising. She reports increased cravings for sweets, particularly during her menstrual cycle, which may be contributing to her weight maintenance. Insulin  resistance is present, with insulin  levels three times higher than normal, promoting weight gain. Emphasized the importance of controlling sugar intake to manage insulin  levels and prevent weight gain. Highlighted the need for exercise to aid in weight loss and improve overall health, explaining the benefits of exercise on insulin  sensitivity, inflammation, and stress regulation. Discussed the need for at least a 5% weight loss in six months to continue phentermine  therapy. - Continue phentermine  37.5 mg as blood pressure and heart rate are well-controlled. - Provide handout on emotional hunger from the [facility]. - Encourage reduction of sweets and control of environment to avoid temptation. - Advise tracking steps at work to assess daily activity level. - Encourage exercise, starting with walking once a week, and gradually increasing. - Reassess weight loss progress in four weeks. Dry mouth Stacy Mills experiences dry mouth as a side effect of phentermine . She reports that drinking water does not alleviate the dryness. Explained that phentermine  can cause dry mouth and suggested alternatives to manage this symptom. - Recommend sugar-free lozenges to manage dry mouth. - Advise staying hydrated throughout the day. - Discuss the use of Biotene products for dry mouth relief. Insulin  resistance Her HOMA-IR is 3.09 which is elevated. Optimal level < 1.9.   This is complex condition associated with genetics, ectopic fat and lifestyle factors. Insulin  resistance may result in increased  fat storage, inhibition of the breakdown of fat, cause fluctuations in blood sugar leading to energy crashes and increased cravings for sugary or high carb foods and cause metabolic slowdown making it difficult to lose weight.  This may result in additional weight gain and lead to pre-diabetes and diabetes if untreated. In addition, hyperinsulinemia increases cardiovascular risk, chronic inflammatory response and may increase the risk of obesity related malignancies.  Lab Results  Component Value Date   HGBA1C 5.4 09/13/2023   Lab Results  Component Value Date   INSULIN  18.1 09/13/2023   INSULIN  15.4 03/27/2023   INSULIN  15.5 04/05/2022   Lab Results  Component Value Date   GLUCOSE 73 09/13/2023   GLUCOSE 112 (H) 07/14/2012   She has been educated on the carbohydrate insulin  model for obesity.  She will continue to work on reducing simple and added sugars in her diet.  She did not tolerate metformin  in the past.         Objective   Physical Exam:  Blood pressure 125/87, pulse 81, temperature 98 F (36.7 C), height 5' 5 (1.651 m), weight 257 lb (116.6 kg), last menstrual period 03/07/2024, SpO2 100%. Body mass index is 42.77 kg/m.  General: She is overweight, cooperative, alert, well developed, and in no acute distress. PSYCH: Has normal mood, affect and thought process.   HEENT: EOMI, sclerae are anicteric. Lungs: Normal breathing effort, no conversational dyspnea. Extremities: No edema.  Neurologic: No gross sensory or motor deficits. No tremors or fasciculations noted.    Diagnostic Data Reviewed:  BMET    Component  Value Date/Time   NA 141 09/13/2023 1532   K 4.1 09/13/2023 1532   CL 106 09/13/2023 1532   CO2 22 09/13/2023 1532   GLUCOSE 73 09/13/2023 1532   GLUCOSE 80 09/13/2022 0936   BUN 9 09/13/2023 1532   CREATININE 0.95 09/13/2023 1532   CREATININE 0.98 09/23/2021 1440   CREATININE 0.93 11/05/2012 1015   CALCIUM 9.3 09/13/2023 1532   GFRNONAA >60  09/23/2021 1440   GFRAA >60 05/31/2019 0417   Lab Results  Component Value Date   HGBA1C 5.4 09/13/2023   HGBA1C 5.4 05/09/2016   Lab Results  Component Value Date   INSULIN  18.1 09/13/2023   INSULIN  15.5 04/05/2022   Lab Results  Component Value Date   TSH 1.250 04/05/2022   CBC    Component Value Date/Time   WBC 9.7 09/13/2023 1532   WBC 7.2 09/23/2021 1440   WBC 8.1 09/16/2021 0847   RBC 4.58 09/13/2023 1532   RBC 4.01 09/23/2021 1440   HGB 13.6 09/13/2023 1532   HCT 40.5 09/13/2023 1532   PLT 319 09/13/2023 1532   MCV 88 09/13/2023 1532   MCH 29.7 09/13/2023 1532   MCH 29.9 09/23/2021 1440   MCHC 33.6 09/13/2023 1532   MCHC 33.1 09/23/2021 1440   RDW 11.8 09/13/2023 1532   Iron Studies No results found for: IRON, TIBC, FERRITIN, IRONPCTSAT Lipid Panel     Component Value Date/Time   CHOL 180 09/13/2023 1532   TRIG 48 09/13/2023 1532   HDL 69 09/13/2023 1532   CHOLHDL 3 01/26/2021 1421   VLDL 9.0 01/26/2021 1421   LDLCALC 101 (H) 09/13/2023 1532   Hepatic Function Panel     Component Value Date/Time   PROT 7.0 09/13/2023 1532   ALBUMIN 4.2 09/13/2023 1532   AST 21 09/13/2023 1532   AST 15 09/23/2021 1440   ALT 20 09/13/2023 1532   ALT 12 09/23/2021 1440   ALKPHOS 61 09/13/2023 1532   BILITOT 0.4 09/13/2023 1532   BILITOT 0.4 09/23/2021 1440      Component Value Date/Time   TSH 1.250 04/05/2022 0839   Nutritional Lab Results  Component Value Date   VD25OH 20.1 (L) 09/13/2023   VD25OH 19.8 (L) 03/27/2023   VD25OH 21.7 (L) 07/28/2022    Medications: Outpatient Encounter Medications as of 03/12/2024  Medication Sig   acetaminophen  (TYLENOL ) 325 MG tablet Take 650 mg by mouth every 6 (six) hours as needed for mild pain or moderate pain.   amoxicillin  (AMOXIL ) 500 MG tablet Take 500 mg by mouth 3 (three) times daily.   phentermine  (ADIPEX-P ) 37.5 MG tablet Take 1 tablet (37.5 mg total) by mouth daily before breakfast.   Vitamin D ,  Ergocalciferol , (DRISDOL ) 1.25 MG (50000 UNIT) CAPS capsule Take 1 capsule (50,000 Units total) by mouth every 7 (seven) days.   No facility-administered encounter medications on file as of 03/12/2024.     Follow-Up   Return in about 4 weeks (around 04/09/2024) for For Weight Mangement with Dr. Francyne.SABRA She was informed of the importance of frequent follow up visits to maximize her success with intensive lifestyle modifications for her multiple health conditions.  Attestation Statement   Reviewed by clinician on day of visit: allergies, medications, problem list, medical history, surgical history, family history, social history, and previous encounter notes.     Lucas Francyne, MD

## 2024-03-12 NOTE — Assessment & Plan Note (Signed)
 Stacy Mills experiences dry mouth as a side effect of phentermine . She reports that drinking water does not alleviate the dryness. Explained that phentermine  can cause dry mouth and suggested alternatives to manage this symptom. - Recommend sugar-free lozenges to manage dry mouth. - Advise staying hydrated throughout the day. - Discuss the use of Biotene products for dry mouth relief.

## 2024-03-14 ENCOUNTER — Ambulatory Visit

## 2024-03-17 ENCOUNTER — Encounter: Payer: Self-pay | Admitting: Nurse Practitioner

## 2024-03-26 NOTE — Telephone Encounter (Signed)
 Copied from CRM [id number]. Topic: General - Other >> Mar 25, 2024  4:19 PM Nessti S wrote: Reason for CRM: pt called to see if physical form was faxed. Call back number [phone]

## 2024-03-26 NOTE — Telephone Encounter (Signed)
 Called patient and informed her that I have received a physical form for her and asked where is the form being faxed from. She stated that it is coming from [facility] upon review of her chart I informed patient that she is due for a physical and asked if she had time to get scheduled for an appointment. She stated that she has a physical in April. I informed her that her last physical was April 2024. Patient stated that she was informed that she would have to change her PCP to a different person due to patient having after 3 PM availability and her current PCP does not. Patient stated that she does not want to change PCP as she was also told when she called our office that if she changes PCP she would have to see provider then come back in a couple of weeks. Patient does not want to change providers and would like to keep who she has currently. I informed patient that I will speak to [facility] and asked her when does form need to be completed by she stated that it is due 04/05/24. She informed me to just schedule her wherever Roselie says call her or send a MyChart message with the date and time and she will make it work with her employer about coming to the appointment.

## 2024-03-27 ENCOUNTER — Ambulatory Visit (INDEPENDENT_AMBULATORY_CARE_PROVIDER_SITE_OTHER): Admitting: Nurse Practitioner

## 2024-03-27 ENCOUNTER — Encounter: Payer: Self-pay | Admitting: Nurse Practitioner

## 2024-03-27 VITALS — BP 122/82 | HR 84 | Temp 98.6°F | Ht 65.0 in | Wt 259.4 lb

## 2024-03-27 DIAGNOSIS — E559 Vitamin D deficiency, unspecified: Secondary | ICD-10-CM

## 2024-03-27 DIAGNOSIS — E7800 Pure hypercholesterolemia, unspecified: Secondary | ICD-10-CM | POA: Diagnosis not present

## 2024-03-27 DIAGNOSIS — Z0001 Encounter for general adult medical examination with abnormal findings: Secondary | ICD-10-CM | POA: Diagnosis not present

## 2024-03-27 LAB — COMPREHENSIVE METABOLIC PANEL WITH GFR
ALT: 14 U/L (ref 0–35)
AST: 14 U/L (ref 0–37)
Albumin: 4 g/dL (ref 3.5–5.2)
Alkaline Phosphatase: 56 U/L (ref 39–117)
BUN: 7 mg/dL (ref 6–23)
CO2: 25 meq/L (ref 19–32)
Calcium: 8.8 mg/dL (ref 8.4–10.5)
Chloride: 107 meq/L (ref 96–112)
Creatinine, Ser: 0.9 mg/dL (ref 0.40–1.20)
GFR: 86.25 mL/min (ref >60.00)
Glucose, Bld: 84 mg/dL (ref 70–99)
Potassium: 3.7 meq/L (ref 3.5–5.1)
Sodium: 139 meq/L (ref 135–145)
Total Bilirubin: 0.5 mg/dL (ref 0.2–1.2)
Total Protein: 7.2 g/dL (ref 6.0–8.3)

## 2024-03-27 LAB — VITAMIN D 25 HYDROXY (VIT D DEFICIENCY, FRACTURES): VITD: 21.81 ng/mL — ABNORMAL LOW (ref 30.00–100.00)

## 2024-03-27 LAB — LIPID PANEL
Cholesterol: 175 mg/dL (ref 0–200)
HDL: 66 mg/dL (ref >39.00)
LDL Cholesterol: 99 mg/dL (ref 0–99)
NonHDL: 108.87
Total CHOL/HDL Ratio: 3
Triglycerides: 47 mg/dL (ref 0.0–149.0)
VLDL: 9.4 mg/dL (ref 0.0–40.0)

## 2024-03-27 NOTE — Patient Instructions (Signed)
 Go to lab Maintain Heart healthy diet and daily exercise. Maintain current medications.

## 2024-03-27 NOTE — Progress Notes (Signed)
 Complete physical exam  Patient: Stacy Mills   DOB: [birth year]   28 y.o. Female  MRN: [id number] Visit Date: 03/27/2024  Subjective:    Chief Complaint  Patient presents with   Annual Exam    FASTING  Requested Pap    Stacy Mills is a 28 y.o. female who presents today for a complete physical exam. She reports consuming a general diet. No exercise regimen She generally feels well. She reports sleeping well. She does not have additional problems to discuss today.  Vision:No Dental:Yes STD Screen:No  BP Readings from Last 3 Encounters:  03/27/24 122/82  03/12/24 125/87  02/13/24 133/81   Wt Readings from Last 3 Encounters:  03/27/24 259 lb 6.4 oz (117.7 kg)  03/12/24 257 lb (116.6 kg)  02/13/24 263 lb (119.3 kg)    Most recent fall risk assessment:    03/27/2024   10:58 AM  Fall Risk   Falls in the past year? 0  Injury with Fall? 0  Risk for fall due to : No Fall Risks  Follow up Falls evaluation completed     Depression screen:Yes - No Depression Most recent depression screenings:    03/27/2024   10:58 AM 07/21/2023    9:52 AM  PHQ 2/9 Scores  PHQ - 2 Score 2 0  PHQ- 9 Score 9     HPI  No problem-specific Assessment & Plan notes found for this encounter.   Past Medical History:  Diagnosis Date   Allergy    Anxiety    Back pain    Depression    Gallbladder problem    HSV (herpes simplex virus) anogenital infection    IIH (idiopathic intracranial hypertension)    IUD (intrauterine device) in place    Paraguard placed 2014, removed 10/2015   Migraine    Pelvic inflammation in female    Pelvic inflammatory disease 10/2012   Past Surgical History:  Procedure Laterality Date   CESAREAN SECTION N/A 08/19/2018   Procedure: CESAREAN SECTION;  Surgeon: Armond Cape, MD;  Location: [facility] LD ORS;  Service: Obstetrics;  Laterality: N/A;   CHOLECYSTECTOMY     spinal tap     Social History   Socioeconomic History   Marital status: Single    Spouse  name: n/a   Number of children: 0   Years of education: 12+   Highest education level: Some college, no degree  Occupational History   Occupation: Archivist   Occupation: PCA  Tobacco Use   Smoking status: Former   Smokeless tobacco: Never  Advertising Account Planner   Vaping status: Never Used  Substance and Sexual Activity   Alcohol use: Yes    Alcohol/week: 2.0 standard drinks of alcohol    Types: 2 Shots of liquor per week    Comment: social   Drug use: Not Currently    Types: Marijuana   Sexual activity: Yes    Partners: Male    Birth control/protection: None    Comment: intercourse age 89, more than 5 sexual partners  Other Topics Concern   Not on file  Social History Narrative   Graduated from high school 10/2012, currently attending college.   Right-handed   Caffeine: 3x/wk   Social Drivers of Health   Financial Resource Strain: Medium Risk (07/21/2023)   Overall Financial Resource Strain (CARDIA)    Difficulty of Paying Living Expenses: Somewhat hard  Food Insecurity: No Food Insecurity (07/21/2023)   Hunger Vital Sign    Worried About Running Out of Food  in the Last Year: Never true    Ran Out of Food in the Last Year: Never true  Transportation Needs: No Transportation Needs (07/21/2023)   PRAPARE - Administrator, Civil Service (Medical): No    Lack of Transportation (Non-Medical): No  Physical Activity: Inactive (07/21/2023)   Exercise Vital Sign    Days of Exercise per Week: 0 days    Minutes of Exercise per Session: 60 min  Stress: Stress Concern Present (07/21/2023)   [employer] of Occupational Health - Occupational Stress Questionnaire    Feeling of Stress : To some extent  Social Connections: Socially Isolated (07/21/2023)   Social Connection and Isolation Panel    Frequency of Communication with Friends and Family: More than three times a week    Frequency of Social Gatherings with Friends and Family: Twice a week    Attends Religious  Services: Never    Database Administrator or Organizations: No    Attends Engineer, Structural: Not on file    Marital Status: Never married  Intimate Partner Violence: Unknown (10/19/2021)   Received from [facility]   HITS    Physically Hurt: Not on file    Insult or Talk Down To: Not on file    Threaten Physical Harm: Not on file    Scream or Curse: Not on file   Family Status  Relation Name Status   Mother Corean Alive   Father Abby Searles   Sister  Alive   MGM  Alive   MGF Marcey Alive   PGM Rose Deceased   PGF  Alive   Mat Aunt Brooke Alive   Sister [place] (Not Specified)   Son Garnette (Not Specified)  No partnership data on file   Family History  Problem Relation Age of Onset   Asthma Mother    High blood pressure Mother    Obesity Mother    Varicose Veins Mother    Hypertension Father    Alcoholism Father    Drug abuse Father    Alcohol abuse Father    Emphysema Maternal Grandfather    Depression Maternal Grandfather    Anxiety disorder Maternal Grandfather    Mental illness Maternal Grandfather    Diabetes Maternal Grandfather    COPD Maternal Grandfather    Cancer Paternal Grandmother    Non-Hodgkin's lymphoma Paternal Grandmother    Sudden death Paternal Grandmother    Early death Paternal Grandmother    Alcohol abuse Maternal Aunt    Hyperlipidemia Maternal Aunt    Anxiety disorder Sister    Anxiety disorder Son    Asthma Son    Allergies  Allergen Reactions   Ivp Dye [Iodinated Contrast Media] Itching and Nausea Only    Contrast for MRI    Patient Care Team: Meriem Lemieux, Roselie Rockford, NP as PCP - General (Internal Medicine)   Medications: Outpatient Medications Prior to Visit  Medication Sig   acetaminophen  (TYLENOL ) 325 MG tablet Take 650 mg by mouth every 6 (six) hours as needed for mild pain or moderate pain.   phentermine  (ADIPEX-P ) 37.5 MG tablet Take 1 tablet (37.5 mg total) by mouth daily before breakfast.   Vitamin D ,  Ergocalciferol , (DRISDOL ) 1.25 MG (50000 UNIT) CAPS capsule Take 1 capsule (50,000 Units total) by mouth every 7 (seven) days.   [DISCONTINUED] amoxicillin  (AMOXIL ) 500 MG tablet Take 500 mg by mouth 3 (three) times daily. (Patient not taking: Reported on 03/27/2024)   No facility-administered medications prior to visit.  Review of Systems  Constitutional:  Negative for activity change, appetite change and unexpected weight change.  Respiratory: Negative.    Cardiovascular: Negative.   Gastrointestinal: Negative.   Endocrine: Negative for cold intolerance and heat intolerance.  Genitourinary: Negative.   Musculoskeletal: Negative.   Skin: Negative.   Neurological: Negative.   Hematological: Negative.   Psychiatric/Behavioral:  Negative for behavioral problems, decreased concentration, dysphoric mood, hallucinations, self-injury, sleep disturbance and suicidal ideas. The patient is not nervous/anxious.         Objective:  BP 122/82 (BP Location: Left Arm, Patient Position: Sitting, Cuff Size: Large)   Pulse 84   Temp 98.6 F (37 C) (Temporal)   Ht 5' 5 (1.651 m)   Wt 259 lb 6.4 oz (117.7 kg)   LMP 03/07/2024 (Exact Date)   SpO2 99%   BMI 43.17 kg/m     Physical Exam Vitals and nursing note reviewed.  Constitutional:      General: She is not in acute distress. HENT:     Right Ear: Tympanic membrane, ear canal and external ear normal.     Left Ear: Tympanic membrane, ear canal and external ear normal.     Nose: Nose normal.  Eyes:     Extraocular Movements: Extraocular movements intact.     Conjunctiva/sclera: Conjunctivae normal.     Pupils: Pupils are equal, round, and reactive to light.  Neck:     Thyroid: No thyroid mass, thyromegaly or thyroid tenderness.  Cardiovascular:     Rate and Rhythm: Normal rate and regular rhythm.     Pulses: Normal pulses.     Heart sounds: Normal heart sounds.  Pulmonary:     Effort: Pulmonary effort is normal.     Breath  sounds: Normal breath sounds.  Abdominal:     General: Bowel sounds are normal.     Palpations: Abdomen is soft.  Musculoskeletal:        General: Normal range of motion.     Cervical back: Normal range of motion and neck supple.     Right lower leg: No edema.     Left lower leg: No edema.  Lymphadenopathy:     Cervical: No cervical adenopathy.  Skin:    General: Skin is warm and dry.  Neurological:     Mental Status: She is alert and oriented to person, place, and time.     Cranial Nerves: No cranial nerve deficit.  Psychiatric:        Mood and Affect: Mood normal.        Behavior: Behavior normal.        Thought Content: Thought content normal.      No results found for any visits on 03/27/24.    Assessment & Plan:    Routine Health Maintenance and Physical Exam  Immunization History  Administered Date(s) Administered   Dtap, Unspecified 09/28/1994, 11/09/1994, 01/02/1995, 07/02/1998, 09/27/1999   HIB, Unspecified 09/28/1994, 11/09/1994, 01/02/1995, 07/06/1995   HPV 9-valent 02/27/2017, 12/30/2021, 09/13/2022   Hep B, Unspecified June 22, 1994, 09/28/1994, 04/04/1995   Hpv-Unspecified 12/30/2021   Influenza Inj Mdck Quad Pf 03/20/2018   Influenza Split 06/04/2012   Influenza,inj,Quad PF,6+ Mos 02/04/2017, 02/27/2017, 01/22/2019, 02/24/2021   Influenza,inj,quad, With Preservative 04/05/2020   Influenza-Unspecified 03/26/2014, 03/27/2016, 02/27/2017, 03/20/2018, 04/05/2020, 01/17/2023, 03/01/2024   MMR 07/06/1995, 09/27/1999   Meningococcal Conjugate 12/17/2012   Moderna Covid-19 Fall Seasonal Vaccine 42yrs & older 02/15/2023   Moderna Sars-Covid-2 Vaccination 10/07/2019   PFIZER(Purple Top)SARS-COV-2 Vaccination 08/31/2019, 04/12/2020   PPD Test 12/14/2012,  10/19/2021   Pfizer Covid-19 Vaccine Bivalent Booster 32yrs & up 02/24/2021, 09/02/2021   Polio, Unspecified 09/28/1994, 11/09/1994, 01/02/1995, 07/06/1995   Tdap 12/17/2012, 05/16/2018   Unspecified SARS-COV-2  Vaccination 09/28/2019   Varicella 10/24/1995, 09/27/1999   Health Maintenance  Topic Date Due   COVID-19 Vaccine (8 - 2025-26 season) 05/29/2024 (Originally 01/29/2024)   Cervical Cancer Screening (Pap smear)  03/27/2025 (Originally 01/27/2024)   DTaP/Tdap/Td (8 - Td or Tdap) 05/16/2028   Influenza Vaccine  Completed   HPV VACCINES  Completed   Hepatitis C Screening  Completed   HIV Screening  Completed   Pneumococcal Vaccine  Aged Out   Meningococcal B Vaccine  Aged Out   Discussed health benefits of physical activity, and encouraged her to engage in regular exercise appropriate for her age and condition.  Problem List Items Addressed This Visit     Vitamin D  deficiency   Relevant Orders   VITAMIN D  25 Hydroxy (Vit-D Deficiency, Fractures)   Other Visit Diagnoses       Encounter for preventative adult health care exam with abnormal findings    -  Primary   Relevant Orders   Comprehensive metabolic panel with GFR     Elevated LDL cholesterol level       Relevant Orders   Lipid panel      Return in about 1 year (around 03/27/2025) for CPE (fasting).     Roselie Mood, NP

## 2024-03-28 ENCOUNTER — Ambulatory Visit: Payer: Self-pay | Admitting: Nurse Practitioner

## 2024-03-29 ENCOUNTER — Encounter: Payer: Self-pay | Admitting: Nurse Practitioner

## 2024-04-16 ENCOUNTER — Ambulatory Visit (INDEPENDENT_AMBULATORY_CARE_PROVIDER_SITE_OTHER): Payer: Self-pay | Admitting: Internal Medicine

## 2024-04-16 ENCOUNTER — Encounter (INDEPENDENT_AMBULATORY_CARE_PROVIDER_SITE_OTHER): Payer: Self-pay | Admitting: Internal Medicine

## 2024-04-16 VITALS — BP 132/84 | HR 86 | Temp 98.0°F | Ht 65.0 in | Wt 258.0 lb

## 2024-04-16 DIAGNOSIS — Z6841 Body Mass Index (BMI) 40.0 and over, adult: Secondary | ICD-10-CM | POA: Diagnosis not present

## 2024-04-16 DIAGNOSIS — K760 Fatty (change of) liver, not elsewhere classified: Secondary | ICD-10-CM

## 2024-04-16 DIAGNOSIS — E66813 Obesity, class 3: Secondary | ICD-10-CM

## 2024-04-16 DIAGNOSIS — E88819 Insulin resistance, unspecified: Secondary | ICD-10-CM | POA: Diagnosis not present

## 2024-04-16 MED ORDER — PHENTERMINE HCL 37.5 MG PO TABS: 37.5000 mg | ORAL_TABLET | Freq: Every day | ORAL | 0 refills | Status: DC

## 2024-04-16 NOTE — Assessment & Plan Note (Signed)
 She has mild hepatic steatosis on CT scan from 2023 with normal liver enzymes.  Fibrosis 4 Score = .43  Patient will continue to work on reducing processed foods, simple and added sugars in her diet.  Continue with current weight management strategy Has lost 10% of body weight  She does not have coverage for GLP-1.

## 2024-04-16 NOTE — Assessment & Plan Note (Signed)
 Class 3 obesity with recent weight loss of 33 pounds since starting phentermine  in April 2025. Current weight loss has slowed, with a recent gain of 1 pound. Contributing factors include recent cruise, increased fast food consumption, and increased Tootsie Roll intake. Phentermine  has been effective in appetite control, but continued weight loss is necessary to maintain its efficacy. If weight loss does not continue, phentermine  will need to be discontinued due to potential tolerance development. - Continue phentermine  prescription. - Encouraged return to previous healthy eating habits, including salads and soups. - Advised reduction of fast food and sweets - Encouraged increased physical activity, including gym attendance. - Scheduled follow-up appointment in 4 weeks to monitor weight loss progress.

## 2024-04-16 NOTE — Assessment & Plan Note (Signed)
 Her HOMA-IR is 3.09 which is elevated. Optimal level < 1.9.   This is complex condition associated with genetics, ectopic fat and lifestyle factors. Insulin  resistance may result in increased fat storage, inhibition of the breakdown of fat, cause fluctuations in blood sugar leading to energy crashes and increased cravings for sugary or high carb foods and cause metabolic slowdown making it difficult to lose weight.  This may result in additional weight gain and lead to pre-diabetes and diabetes if untreated. In addition, hyperinsulinemia increases cardiovascular risk, chronic inflammatory response and may increase the risk of obesity related malignancies.  Lab Results  Component Value Date   HGBA1C 5.4 09/13/2023   Lab Results  Component Value Date   INSULIN  18.1 09/13/2023   INSULIN  15.4 03/27/2023   INSULIN  15.5 04/05/2022   Lab Results  Component Value Date   GLUCOSE 84 03/27/2024   GLUCOSE 112 (H) 07/14/2012   She has been educated on the carbohydrate insulin  model for obesity.  She will continue to work on reducing simple and added sugars in her diet.  She did not tolerate metformin  in the past.

## 2024-04-16 NOTE — Progress Notes (Signed)
 Patient presents today for medical weight  Office: [phone]  /  Fax: [phone]  Weight Summary and Body Composition Analysis (BIA)  Vitals Temp: 98 F (36.7 C) BP: 132/84 Pulse Rate: 86 SpO2: 100 %   Anthropometric Measurements Height: 5' 5 (1.651 m) Weight: 258 lb (117 kg) BMI (Calculated): 42.93 Weight at Last Visit: 257 lb Weight Lost Since Last Visit: 0 lb Weight Gained Since Last Visit: 1 lb Starting Weight: 276 lb Total Weight Loss (lbs): 18 lb (8.165 kg) Peak Weight: 283 lb   Body Composition  Body Fat %: 48.9 % Fat Mass (lbs): 126.4 lbs Muscle Mass (lbs): 125.6 lbs Total Body Water (lbs): 101 lbs Visceral Fat Rating : 13    RMR: [id number]  Today's Visit #: 24  Starting Date: 04/05/22   Subjective   Chief Complaint: Obesity  Interval History Discussed the use of AI scribe software for clinical note transcription with the patient, who gave verbal consent to proceed.  History of Present Illness Stacy Mills is a 28 year old female with MASLD and insulin  resistance who presents for medical weight management. She is accompanied by her son Stacy Mills.  She has been participating in a medical weight management program since 2023, achieving a total weight loss of 18 pounds since beginning the program. In April 2025, she began taking phentermine , which initially resulted in a weight loss of 33 pounds, equating to an 11.3% reduction in her body weight. However, she has experienced some weight regain, which she attributes to dietary lapses and decreased physical activity.  Her recent weight loss progress has slowed, which she attributes to a lapse in her diet and physical activity, including a recent cruise and her sister's health problems. She has been consuming more fast food and takeout, particularly in the last two weeks, and has developed a habit of eating Tootsie Rolls. Despite this, phentermine  helps with appetite control, allowing her to feel full and  reducing her meal frequency.  She mentions that she has access to a gym but has not been consistent in attending. Her current medications include phentermine .     Challenges affecting patient progress: having difficulty focusing on healthy eating, difficulty implementing reduced calorie nutrition plan, low volume of physical activity at present , and recent lapse in weight management plan due to work, travel, illness or family circumstances.    Pharmacotherapy for weight management: She is currently taking Phentermine  (longterm use, off-label, single agent)  with waning clinical response and without side effects..   Assessment and Plan   Treatment Plan For Obesity:  Recommended Dietary Goals  Stacy Mills is currently in the action stage of change. As such, her goal is to continue weight management plan. She has agreed to: continue current plan  Behavioral Health and Counseling  We discussed the following behavioral modification strategies today: continue to work on maintaining a reduced calorie state, getting the recommended amount of protein, incorporating whole foods, making healthy choices, staying well hydrated and practicing mindfulness when eating. and increase protein intake, fibrous foods (25 grams per day for women, 30 grams for men) and water to improve satiety and decrease hunger signals. .  Additional education and resources provided today: Handout on traveling and holiday eating strategies  Recommended Physical Activity Goals  Stacy Mills has been advised to work up to 150 minutes of moderate intensity aerobic activity a week and strengthening exercises 2-3 times per week for cardiovascular health, weight loss maintenance and preservation of muscle mass.  She has agreed to :  Think about enjoyable ways to increase daily physical activity and overcoming barriers to exercise, Increase physical activity in their day and reduce sedentary time (increase NEAT)., Increase volume of  physical activity to a goal of 240 minutes a week, and Combine aerobic and strengthening exercises for efficiency and improved cardiometabolic health.  Medical Interventions and Pharmacotherapy  We discussed various medication options to help Stacy Mills with her weight loss efforts and we both agreed to : She has been on phentermine  since April and after an initial 33 pound weight loss she has had slow down due to nutritional and physical activity changes. We have discussed that long-term use of phentermine  for weight management. Patient informed that use beyond 12 weeks is off-label. Reviewed evidence supporting extended use in select patients with regular monitoring.   Patient verbalized understanding and agreed to continue phentermine  with close monitoring. Plan includes regular follow-up every 4-6 weeks to assess efficacy, side effects, and vitals.  Medication safety: Reviewed common side effects of phentermine , no side effects reported.  Reviewed vitals signs and they are stable Reviewed for contraindications, none present Reviewed state registry for controlled substances and no other controlled substances found If there is waning response to phentermine  medication will be discontinued over the next month or 2 Discussed safety data and off label use for long-term treatment of obesity.   Associated Conditions Impacted by Obesity Treatment  Assessment & Plan Class 3 severe obesity with serious comorbidity and body mass index (BMI) of 40.0 to 44.9 in adult [facility]) Class 3 obesity with recent weight loss of 33 pounds since starting phentermine  in April 2025. Current weight loss has slowed, with a recent gain of 1 pound. Contributing factors include recent cruise, increased fast food consumption, and increased Tootsie Roll intake. Phentermine  has been effective in appetite control, but continued weight loss is necessary to maintain its efficacy. If weight loss does not continue, phentermine  will need  to be discontinued due to potential tolerance development. - Continue phentermine  prescription. - Encouraged return to previous healthy eating habits, including salads and soups. - Advised reduction of fast food and sweets - Encouraged increased physical activity, including gym attendance. - Scheduled follow-up appointment in 4 weeks to monitor weight loss progress. Metabolic dysfunction-associated steatotic liver disease (MASLD) She has mild hepatic steatosis on CT scan from 2023 with normal liver enzymes.  Fibrosis 4 Score = .43  Patient will continue to work on reducing processed foods, simple and added sugars in her diet.  Continue with current weight management strategy Has lost 10% of body weight  She does not have coverage for GLP-1.  Insulin  resistance Her HOMA-IR is 3.09 which is elevated. Optimal level < 1.9.   This is complex condition associated with genetics, ectopic fat and lifestyle factors. Insulin  resistance may result in increased fat storage, inhibition of the breakdown of fat, cause fluctuations in blood sugar leading to energy crashes and increased cravings for sugary or high carb foods and cause metabolic slowdown making it difficult to lose weight.  This may result in additional weight gain and lead to pre-diabetes and diabetes if untreated. In addition, hyperinsulinemia increases cardiovascular risk, chronic inflammatory response and may increase the risk of obesity related malignancies.  Lab Results  Component Value Date   HGBA1C 5.4 09/13/2023   Lab Results  Component Value Date   INSULIN  18.1 09/13/2023   INSULIN  15.4 03/27/2023   INSULIN  15.5 04/05/2022   Lab Results  Component Value Date   GLUCOSE 84 03/27/2024   GLUCOSE  112 (H) 07/14/2012   She has been educated on the carbohydrate insulin  model for obesity.  She will continue to work on reducing simple and added sugars in her diet.  She did not tolerate metformin  in the past.    Assessment and  Plan Assessment & Plan Class 3 Obesity   Metabolic dysfunction-associated steatotic liver disease (MASLD) MASLD associated with obesity and insulin  resistance. Weight management is crucial for improving liver health. - Continue weight management strategies to improve liver health.  Insulin  resistance Associated with obesity and MASLD. Weight loss and dietary modifications are essential for improving insulin  sensitivity. - Continue weight management and dietary modifications to improve insulin  sensitivity.  Recording duration: 12 minutes      Objective   Physical Exam:  Blood pressure 132/84, pulse 86, temperature 98 F (36.7 C), height 5' 5 (1.651 m), weight 258 lb (117 kg), last menstrual period 04/09/2024, SpO2 100%. Body mass index is 42.93 kg/m.  General: She is overweight, cooperative, alert, well developed, and in no acute distress. PSYCH: Has normal mood, affect and thought process.   HEENT: EOMI, sclerae are anicteric. Lungs: Normal breathing effort, no conversational dyspnea. Extremities: No edema.  Neurologic: No gross sensory or motor deficits. No tremors or fasciculations noted.    Diagnostic Data Reviewed:  BMET    Component Value Date/Time   NA 139 03/27/2024 1132   NA 141 09/13/2023 1532   K 3.7 03/27/2024 1132   CL 107 03/27/2024 1132   CO2 25 03/27/2024 1132   GLUCOSE 84 03/27/2024 1132   BUN 7 03/27/2024 1132   BUN 9 09/13/2023 1532   CREATININE 0.90 03/27/2024 1132   CREATININE 0.98 09/23/2021 1440   CREATININE 0.93 11/05/2012 1015   CALCIUM 8.8 03/27/2024 1132   GFRNONAA >60 09/23/2021 1440   GFRAA >60 05/31/2019 0417   Lab Results  Component Value Date   HGBA1C 5.4 09/13/2023   HGBA1C 5.4 05/09/2016   Lab Results  Component Value Date   INSULIN  18.1 09/13/2023   INSULIN  15.5 04/05/2022   Lab Results  Component Value Date   TSH 1.250 04/05/2022   CBC    Component Value Date/Time   WBC 9.7 09/13/2023 1532   WBC 7.2 09/23/2021  1440   WBC 8.1 09/16/2021 0847   RBC 4.58 09/13/2023 1532   RBC 4.01 09/23/2021 1440   HGB 13.6 09/13/2023 1532   HCT 40.5 09/13/2023 1532   PLT 319 09/13/2023 1532   MCV 88 09/13/2023 1532   MCH 29.7 09/13/2023 1532   MCH 29.9 09/23/2021 1440   MCHC 33.6 09/13/2023 1532   MCHC 33.1 09/23/2021 1440   RDW 11.8 09/13/2023 1532   Iron Studies No results found for: IRON, TIBC, FERRITIN, IRONPCTSAT Lipid Panel     Component Value Date/Time   CHOL 175 03/27/2024 1132   CHOL 180 09/13/2023 1532   TRIG 47.0 03/27/2024 1132   HDL 66.00 03/27/2024 1132   HDL 69 09/13/2023 1532   CHOLHDL 3 03/27/2024 1132   VLDL 9.4 03/27/2024 1132   LDLCALC 99 03/27/2024 1132   LDLCALC 101 (H) 09/13/2023 1532   Hepatic Function Panel     Component Value Date/Time   PROT 7.2 03/27/2024 1132   PROT 7.0 09/13/2023 1532   ALBUMIN 4.0 03/27/2024 1132   ALBUMIN 4.2 09/13/2023 1532   AST 14 03/27/2024 1132   AST 15 09/23/2021 1440   ALT 14 03/27/2024 1132   ALT 12 09/23/2021 1440   ALKPHOS 56 03/27/2024 1132  BILITOT 0.5 03/27/2024 1132   BILITOT 0.4 09/13/2023 1532   BILITOT 0.4 09/23/2021 1440      Component Value Date/Time   TSH 1.250 04/05/2022 0839   Nutritional Lab Results  Component Value Date   VD25OH 21.81 (L) 03/27/2024   VD25OH 20.1 (L) 09/13/2023   VD25OH 19.8 (L) 03/27/2023    Medications: Outpatient Encounter Medications as of 04/16/2024  Medication Sig   acetaminophen  (TYLENOL ) 325 MG tablet Take 650 mg by mouth every 6 (six) hours as needed for mild pain or moderate pain.   phentermine  (ADIPEX-P ) 37.5 MG tablet Take 1 tablet (37.5 mg total) by mouth daily before breakfast.   Vitamin D , Ergocalciferol , (DRISDOL ) 1.25 MG (50000 UNIT) CAPS capsule Take 1 capsule (50,000 Units total) by mouth every 7 (seven) days.   [DISCONTINUED] phentermine  (ADIPEX-P ) 37.5 MG tablet Take 1 tablet (37.5 mg total) by mouth daily before breakfast.   No facility-administered  encounter medications on file as of 04/16/2024.     Follow-Up   Return in about 4 weeks (around 05/14/2024) for For Weight Mangement with Dr. Francyne.SABRA She was informed of the importance of frequent follow up visits to maximize her success with intensive lifestyle modifications for her multiple health conditions.  Attestation Statement   Reviewed by clinician on day of visit: allergies, medications, problem list, medical history, surgical history, family history, social history, and previous encounter notes.     Lucas Francyne, MD management

## 2024-04-18 ENCOUNTER — Ambulatory Visit
Admission: RE | Admit: 2024-04-18 | Discharge: 2024-04-18 | Disposition: A | Discharge status: Home / Self Care | Source: Ambulatory Visit

## 2024-04-18 ENCOUNTER — Other Ambulatory Visit: Payer: Self-pay

## 2024-04-18 VITALS — BP 126/90 | HR 78 | Temp 98.4°F | Resp 18

## 2024-04-18 DIAGNOSIS — L089 Local infection of the skin and subcutaneous tissue, unspecified: Secondary | ICD-10-CM | POA: Diagnosis not present

## 2024-04-18 DIAGNOSIS — S81801A Unspecified open wound, right lower leg, initial encounter: Secondary | ICD-10-CM

## 2024-04-18 MED ORDER — TETANUS-DIPHTH-ACELL PERTUSSIS 5-2-15.5 LF-MCG/0.5 IM SUSP
0.5000 mL | Freq: Once | INTRAMUSCULAR | Status: AC
  Administered 2024-04-18: 0.5 mL via INTRAMUSCULAR

## 2024-04-18 MED ORDER — SULFAMETHOXAZOLE-TRIMETHOPRIM 800-160 MG PO TABS: 1.0000 | ORAL_TABLET | Freq: Two times a day (BID) | ORAL | 0 refills | Status: DC

## 2024-04-18 NOTE — ED Triage Notes (Signed)
 [place]ed her right lower leg on [place] on 04/11/24. Voices concern for infection. States the wound has been draining. Ambulated to room with quick steady gait.

## 2024-04-18 NOTE — Discharge Instructions (Addendum)
 You have been diagnosed with an abrasion/laceration that requires wound care.  -Use you have the area clean and dry -Change your bandage at least daily, keep bandage clean and dry.  If bandages become soiled or wet they need to be changed soon as possible. -Will get wound daily, if you notice color discharge, foul odor, or pale/gray/black discoloration of skin, increased redness, pain, or swelling you need to seek attention immediately. -You may use ibuprofen and Tylenol as needed for pain control.

## 2024-04-18 NOTE — ED Provider Notes (Signed)
 [facility]    CSN: [id number] Arrival date & time: 04/18/24  1556      History   Chief Complaint Chief Complaint  Patient presents with   Wound Check    HPI Stacy Mills is a 28 y.o. female.   Pt presents today due to open wound of right lower leg that occurred on 11/13. Pt states that she was out of the country and believe she may have cut her skin on a piece of coral. Pt cannot remember last tetanus immunization. Pt states that she has been experiencing pain and swelling in the area of the open wound. Pt states that she has noticed some drainage from wound but denies that it is malodorous. Pt denies fever or chills.   The history is provided by the patient.  Wound Check    Past Medical History:  Diagnosis Date   Allergy    Anxiety    Back pain    Depression    Gallbladder problem    HSV (herpes simplex virus) anogenital infection    IIH (idiopathic intracranial hypertension)    IUD (intrauterine device) in place    Paraguard placed 2014, removed 10/2015   Migraine    Pelvic inflammation in female    Pelvic inflammatory disease 10/2012    Patient Active Problem List   Diagnosis Date Noted   Dry mouth 03/12/2024   Metabolic dysfunction-associated steatotic liver disease (MASLD) 09/13/2023   Primary insomnia 03/12/2023   Obesity- Start BMI 44.43 01/12/2023   Abnormal food appetite 07/28/2022   Vitamin D  deficiency 06/23/2022   Insulin  resistance 06/23/2022   Family history of non-Hodgkin's lymphoma 09/09/2021   Lymphadenopathy 09/09/2021   Lower abdominal pain 08/31/2021   Acanthosis nigricans 01/26/2021   Liver abscess 05/30/2019   Genital herpes simplex 06/22/2018   Benign intracranial hypertension 05/19/2018   Migraines 12/22/2014   Hemorrhagic cyst of ovary 11/07/2012   Class 3 severe obesity with serious comorbidity and body mass index (BMI) of 40.0 to 44.9 in adult [facility]) 08/26/2012    Past Surgical History:  Procedure Laterality  Date   CESAREAN SECTION N/A 08/19/2018   Procedure: CESAREAN SECTION;  Surgeon: Armond Cape, MD;  Location: [facility] ORS;  Service: Obstetrics;  Laterality: N/A;   CHOLECYSTECTOMY     spinal tap      OB History     Gravida  1   Para  1   Term  1   Preterm      AB  0   Living  1      SAB      IAB      Ectopic  0   Multiple  0   Live Births  1            Home Medications    Prior to Admission medications   Medication Sig Start Date End Date Taking? Authorizing Provider  acetaminophen  (TYLENOL ) 325 MG tablet Take 650 mg by mouth every 6 (six) hours as needed for mild pain or moderate pain.   Yes [provider]  phentermine  (ADIPEX-P ) 37.5 MG tablet Take 1 tablet (37.5 mg total) by mouth daily before breakfast. 04/16/24  Yes Francyne Romano, MD  sulfamethoxazole -trimethoprim  (BACTRIM  DS) 800-160 MG tablet Take 1 tablet by mouth 2 (two) times daily for 10 days. 04/18/24 04/28/24 Yes Andra Krabbe C, PA-C  Vitamin D , Ergocalciferol , (DRISDOL ) 1.25 MG (50000 UNIT) CAPS capsule Take 1 capsule (50,000 Units total) by mouth every 7 (seven) days. 02/13/24  Yes Francyne Romano, MD    Family History Family History  Problem Relation Age of Onset   Asthma Mother    High blood pressure Mother    Obesity Mother    Varicose Veins Mother    Hypertension Father    Alcoholism Father    Drug abuse Father    Alcohol abuse Father    Emphysema Maternal Grandfather    Depression Maternal Grandfather    Anxiety disorder Maternal Grandfather    Mental illness Maternal Grandfather    Diabetes Maternal Grandfather    COPD Maternal Grandfather    Cancer Paternal Grandmother    Non-Hodgkin's lymphoma Paternal Grandmother    Sudden death Paternal Grandmother    Early death Paternal Grandmother    Alcohol abuse Maternal Aunt    Hyperlipidemia Maternal Aunt    Anxiety disorder Sister    Anxiety disorder Son    Asthma Son     Social History Social  History   Tobacco Use   Smoking status: Former   Smokeless tobacco: Never  Vaping Use   Vaping status: Never Used  Substance Use Topics   Alcohol use: Yes    Alcohol/week: 2.0 standard drinks of alcohol    Types: 2 Shots of liquor per week    Comment: social   Drug use: Not Currently    Types: Marijuana     Allergies   Ivp dye [iodinated contrast media]   Review of Systems Review of Systems   Physical Exam Triage Vital Signs ED Triage Vitals  Encounter Vitals Group     BP 04/18/24 1606 (!) 126/90     Girls Systolic BP Percentile --      Girls Diastolic BP Percentile --      Boys Systolic BP Percentile --      Boys Diastolic BP Percentile --      Pulse Rate 04/18/24 1606 78     Resp 04/18/24 1606 18     Temp 04/18/24 1606 98.4 F (36.9 C)     Temp Source 04/18/24 1606 Oral     SpO2 04/18/24 1606 98 %     Weight --      Height --      Head Circumference --      Peak Flow --      Pain Score 04/18/24 1604 3     Pain Loc --      Pain Education --      Exclude from Growth Chart --    No data found.  Updated Vital Signs BP (!) 126/90 (BP Location: Left Arm)   Pulse 78   Temp 98.4 F (36.9 C) (Oral)   Resp 18   LMP 04/09/2024   SpO2 98%   Visual Acuity Right Eye Distance:   Left Eye Distance:   Bilateral Distance:    Right Eye Near:   Left Eye Near:    Bilateral Near:     Physical Exam Vitals and nursing note reviewed.  Constitutional:      General: She is not in acute distress.    Appearance: Normal appearance. She is not ill-appearing, toxic-appearing or diaphoretic.  Eyes:     General: No scleral icterus. Cardiovascular:     Rate and Rhythm: Normal rate and regular rhythm.     Heart sounds: Normal heart sounds.  Pulmonary:     Effort: Pulmonary effort is normal. No respiratory distress.     Breath sounds: Normal breath sounds. No wheezing or rhonchi.  Musculoskeletal:  Legs:     Comments: Open wound of right lower leg where  indicated on diagram (see picture)  Skin:    General: Skin is warm.  Neurological:     Mental Status: She is alert and oriented to person, place, and time.  Psychiatric:        Mood and Affect: Mood normal.        Behavior: Behavior normal.      UC Treatments / Results  Labs (all labs ordered are listed, but only abnormal results are displayed) Labs Reviewed - No data to display  EKG   Radiology No results found.  Procedures Procedures (including critical care time)  Medications Ordered in UC Medications  Tdap (ADACEL) injection 0.5 mL (0.5 mLs Intramuscular Given 04/18/24 1629)    Initial Impression / Assessment and Plan / UC Course  I have reviewed the triage vital signs and the nursing notes.  Pertinent labs & imaging results that were available during my care of the patient were reviewed by me and considered in my medical decision making (see chart for details).    Final Clinical Impressions(s) / UC Diagnoses   Final diagnoses:  Traumatic open wound of right lower leg with infection, initial encounter     Discharge Instructions      You have been diagnosed with an abrasion/laceration that requires wound care.  -Use you have the area clean and dry -Change your bandage at least daily, keep bandage clean and dry.  If bandages become soiled or wet they need to be changed soon as possible. -Will get wound daily, if you notice color discharge, foul odor, or pale/gray/black discoloration of skin, increased redness, pain, or swelling you need to seek attention immediately. -You may use ibuprofen  and Tylenol  as needed for pain control.    ED Prescriptions     Medication Sig Dispense Auth. Provider   sulfamethoxazole -trimethoprim  (BACTRIM  DS) 800-160 MG tablet Take 1 tablet by mouth 2 (two) times daily for 10 days. 20 tablet Andra Corean BROCKS, PA-C      PDMP not reviewed this encounter.   Andra Corean BROCKS, PA-C 04/18/24 1646

## 2024-04-21 ENCOUNTER — Encounter: Admitting: Nurse Practitioner

## 2024-04-21 DIAGNOSIS — L089 Local infection of the skin and subcutaneous tissue, unspecified: Secondary | ICD-10-CM

## 2024-04-21 NOTE — Progress Notes (Signed)
 Stacy Mills logged in 4 minutes before visit and then logged off shortly after. She did not return for appointment

## 2024-04-22 ENCOUNTER — Ambulatory Visit: Payer: Self-pay

## 2024-04-22 NOTE — Telephone Encounter (Signed)
 FYI Only or Action Required?: FYI only for provider: no appointments, urgent care advised .  Patient was last seen in primary care on 04/21/2024 by Theotis Haze ORN, NP.  Called Nurse Triage reporting Headache, Rash, and Sore Throat.  Symptoms began several days ago.  Interventions attempted: OTC medications: Tylenol  and Rest, hydration, or home remedies.  Symptoms are: gradually worsening.  Triage Disposition: See Physician Within 24 Hours  Patient/caregiver understands and will follow disposition?: Yes   Copied from CRM #[id number]. Topic: Clinical - Red Word Triage >> Apr 22, 2024  9:37 AM Lonell PEDLAR wrote: Red Word that prompted transfer to Nurse Triage: Worsening headache, rash/hives, and throat pain Reason for Disposition  [1] Rash AND [2] widespread (especially chest and abdomen)  Answer Assessment - Initial Assessment Questions 1. ONSET: When did the throat start hurting? (Hours or days ago)      04/21/24 2. SEVERITY: How bad is the sore throat? (Scale 1-10; mild, moderate or severe)     Moderate, right side feels swollen but no trouble breathing 3. STREP EXPOSURE: Has there been any exposure to strep within the past week? If Yes, ask: What type of contact occurred?      unsure 4.  VIRAL SYMPTOMS: Are there any symptoms of a cold, such as a runny nose, cough, hoarse voice or red eyes?       5. FEVER: Do you have a fever? If Yes, ask: What is your temperature, how was it measured, and when did it start?     Has chills 6. PUS ON THE TONSILS: Is there pus on the tonsils in the back of your throat?     denies 7. OTHER SYMPTOMS: Do you have any other symptoms? (e.g., difficulty breathing, headache, rash)     Headache Friday, rash started last night on face and hands 8. PREGNANCY: Is there any chance you are pregnant? When was your last menstrual period?  Protocols used: Sore Throat-A-AH

## 2024-04-23 ENCOUNTER — Ambulatory Visit: Admitting: Nurse Practitioner

## 2024-04-23 ENCOUNTER — Encounter: Payer: Self-pay | Admitting: Nurse Practitioner

## 2024-04-23 VITALS — BP 110/74 | HR 101 | Temp 98.0°F | Ht 65.0 in | Wt 260.6 lb

## 2024-04-23 DIAGNOSIS — S81801D Unspecified open wound, right lower leg, subsequent encounter: Secondary | ICD-10-CM | POA: Diagnosis not present

## 2024-04-23 DIAGNOSIS — T300 Burn of unspecified body region, unspecified degree: Secondary | ICD-10-CM

## 2024-04-23 DIAGNOSIS — S81801A Unspecified open wound, right lower leg, initial encounter: Secondary | ICD-10-CM | POA: Insufficient documentation

## 2024-04-23 DIAGNOSIS — T7840XA Allergy, unspecified, initial encounter: Secondary | ICD-10-CM | POA: Diagnosis not present

## 2024-04-23 HISTORY — DX: Burn of unspecified body region, unspecified degree: T30.0

## 2024-04-23 HISTORY — DX: Allergy, unspecified, initial encounter: T78.40XA

## 2024-04-23 MED ORDER — PREDNISONE 20 MG PO TABS: 40.0000 mg | ORAL_TABLET | Freq: Every day | ORAL | 0 refills | Status: DC

## 2024-04-23 MED ORDER — CEPHALEXIN 500 MG PO CAPS: 500.0000 mg | ORAL_CAPSULE | Freq: Three times a day (TID) | ORAL | 0 refills | Status: DC

## 2024-04-23 MED ORDER — METHYLPREDNISOLONE SODIUM SUCC 40 MG IJ SOLR
40.0000 mg | Freq: Once | INTRAMUSCULAR | Status: AC
  Administered 2024-04-23: 40 mg via INTRAMUSCULAR

## 2024-04-23 MED ORDER — DOXYCYCLINE HYCLATE 100 MG PO TABS: 100.0000 mg | ORAL_TABLET | Freq: Two times a day (BID) | ORAL | 0 refills | Status: DC

## 2024-04-23 NOTE — Assessment & Plan Note (Signed)
 The infected open wound with cellulitis from a coral injury is improving. Antibiotics were stopped due to the allergic reaction, and Keflex  and doxycycline  were chosen for coverage. She will start Keflex  500mg  three times a day and doxycycline  100mg  twice a day, both for 10 days. The wound should be washed with soap and water, antibiotic ointment applied, and covered with a nonstick bandage. A follow-up wound check is scheduled in 7 days. She should return sooner if redness spreads, fever develops, or symptoms worsen. She is UTD on tetanus injection. Gave verbal consent for picture in chart.

## 2024-04-23 NOTE — Assessment & Plan Note (Signed)
 She experienced an acute allergic reaction to Bactrim , presenting with a rash, throat swelling, headache, and chills. Benadryl  was ineffective. Give solumedrol 40mg  IM in office today. She will start prednisone  40 mg daily in the morning with food for 5 days starting tomorrow. Benadryl  can be continued as needed for itching. She is advised to avoid Bactrim  in the future, added to allergy list.

## 2024-04-23 NOTE — Assessment & Plan Note (Signed)
 The burn from a dog leash injury is painful but not infected. It should be washed with soap and water, antibiotic ointment applied, and left open to air. Cool compresses can be used for pain relief.

## 2024-04-23 NOTE — Patient Instructions (Addendum)
 It was great to see you!  Start keflex  3 times a day for 10 days   Start doxycyline twice a day for 10 days   Start prednisone  2 tablets daily in the morning with food for 5 days tomorrow  Keep washing the area with soap and water and apply thin layer of antibiotic ointment and cover  Do the same with the burn on the back of your leg, but do not cover   Cool compresses can help with pain   You can take benadryl  as needed for itching  Let's follow-up in 7 days, sooner if you have concerns.  If a referral was placed today, you will be contacted for an appointment. Please note that routine referrals can sometimes take up to 3-4 weeks to process. Please call our office if you haven't heard anything after this time frame.  Take care,  Tinnie Harada, NP

## 2024-04-23 NOTE — Telephone Encounter (Signed)
 Noted has appointment today. Dm/cma

## 2024-04-23 NOTE — Progress Notes (Signed)
 Acute Office Visit  Subjective:     Patient ID: Stacy Mills, female    DOB: Apr 14, 1996, 28 y.o.   MRN: [id number]  Chief Complaint  Patient presents with   Allergic Reaction    Feels throat is swollen, skin infection on right leg, Headaches since Friday and rash started on Sunday, tingling and numbness in fingers, bodyaches, nauseated    HPI: Discussed the use of AI scribe software for clinical note transcription with the patient, who gave verbal consent to proceed.  History of Present Illness   Stacy Mills is a 28 year old female who presents with an allergic reaction to Bactrim  and a worsening leg wound.  She developed a diffuse rash after starting Bactrim  for her leg wound. The rash involves her face, arms, hands, back, neck, and scalp, is sometimes itchy, and began Sunday night after starting Bactrim  Thursday night. She notes redness and swelling in her throat, more on the right side, along with tingling, chills, and persistent headaches since Friday that have not improved with Tylenol  or Benadryl . She stopped Bactrim  on Sunday.  Her coral-related leg wound has enlarged beyond the bandage with yellow drainage. It remains sore and red around the area, especially after cleaning. She has been washing with soap, water, and wound cleaner, using antibiotic ointment, and covering with a nonstick bandage.  She also has a new painful burn on the back of her leg from contact with her dog that started over the weekend. She is applying antibiotic cream.  She denies shortness of breath or fever but notes throat swelling on the right, chills, and ongoing headaches she associates with the medication.      ROS See pertinent positives and negatives per HPI.     Objective:    BP 110/74 (BP Location: Left Arm, Patient Position: Sitting, Cuff Size: Normal)   Pulse (!) 101   Temp 98 F (36.7 C)   Ht 5' 5 (1.651 m)   Wt 260 lb 9.6 oz (118.2 kg)   LMP 04/09/2024 (Exact Date)   SpO2 99%    BMI 43.37 kg/m    Physical Exam Vitals and nursing note reviewed.  Constitutional:      General: She is not in acute distress.    Appearance: Normal appearance.  HENT:     Head: Normocephalic.     Mouth/Throat:     Pharynx: No posterior oropharyngeal erythema.  Eyes:     Conjunctiva/sclera: Conjunctivae normal.  Cardiovascular:     Rate and Rhythm: Normal rate and regular rhythm.     Pulses: Normal pulses.     Heart sounds: Normal heart sounds.  Pulmonary:     Effort: Pulmonary effort is normal.     Breath sounds: Normal breath sounds.  Musculoskeletal:     Cervical back: Normal range of motion and neck supple.  Skin:    General: Skin is warm.     Comments: Open wound to right lower shin with small amount yellow drainage and surrounding erythema and swelling. Linear blistering area to back of right knee where leash rubbed  Neurological:     General: No focal deficit present.     Mental Status: She is alert and oriented to person, place, and time.  Psychiatric:        Mood and Affect: Mood normal.        Behavior: Behavior normal.        Thought Content: Thought content normal.        Judgment: Judgment normal.  No results found for any visits on 04/23/24.      Assessment & Plan:   Problem List Items Addressed This Visit       Other   Wound of right leg - Primary   The infected open wound with cellulitis from a coral injury is improving. Antibiotics were stopped due to the allergic reaction, and Keflex  and doxycycline  were chosen for coverage. She will start Keflex  500mg  three times a day and doxycycline  100mg  twice a day, both for 10 days. The wound should be washed with soap and water, antibiotic ointment applied, and covered with a nonstick bandage. A follow-up wound check is scheduled in 7 days. She should return sooner if redness spreads, fever develops, or symptoms worsen. She is UTD on tetanus injection. Gave verbal consent for picture in chart.        Allergic reaction   She experienced an acute allergic reaction to Bactrim , presenting with a rash, throat swelling, headache, and chills. Benadryl  was ineffective. Give solumedrol 40mg  IM in office today. She will start prednisone  40 mg daily in the morning with food for 5 days starting tomorrow. Benadryl  can be continued as needed for itching. She is advised to avoid Bactrim  in the future, added to allergy list.      Burn   The burn from a dog leash injury is painful but not infected. It should be washed with soap and water, antibiotic ointment applied, and left open to air. Cool compresses can be used for pain relief.        Meds ordered this encounter  Medications   predniSONE  (DELTASONE ) 20 MG tablet    Sig: Take 2 tablets (40 mg total) by mouth daily with breakfast.    Dispense:  10 tablet    Refill:  0   cephALEXin  (KEFLEX ) 500 MG capsule    Sig: Take 1 capsule (500 mg total) by mouth 3 (three) times daily.    Dispense:  30 capsule    Refill:  0   doxycycline  (VIBRA -TABS) 100 MG tablet    Sig: Take 1 tablet (100 mg total) by mouth 2 (two) times daily.    Dispense:  20 tablet    Refill:  0   methylPREDNISolone  sodium succinate (SOLU-MEDROL ) 40 mg/mL injection 40 mg    Return in about 1 week (around 04/30/2024) for wound check with PCP or me .  Tinnie DELENA Harada, NP

## 2024-04-30 ENCOUNTER — Encounter: Payer: Self-pay | Admitting: Nurse Practitioner

## 2024-04-30 ENCOUNTER — Ambulatory Visit: Admitting: Nurse Practitioner

## 2024-04-30 VITALS — BP 110/80 | HR 92 | Temp 97.8°F | Ht 66.0 in | Wt 259.6 lb

## 2024-04-30 DIAGNOSIS — S81801D Unspecified open wound, right lower leg, subsequent encounter: Secondary | ICD-10-CM

## 2024-04-30 NOTE — Patient Instructions (Signed)
 Complete oral antibiotics and dressing change Use systane eye drop to help with eye irritation/dryness

## 2024-04-30 NOTE — Assessment & Plan Note (Signed)
 Resolved facial swelling and hives with oral prednisone . Reports residual right eye foreign object sensation. No change in vision, no drainage Improving wound with keflex  and doxycycline  UTD with TDAP.  Advised to Complete oral antibiotics and dressing change Use systane eye drop to help with eye irritation/dryness

## 2024-04-30 NOTE — Progress Notes (Signed)
                Established Patient Visit  Patient: Stacy Mills   DOB: [birth year]   28 y.o. Female  MRN: [id number] Visit Date: 04/30/2024  Subjective:    Chief Complaint  Patient presents with   Follow-up    1 week wound is better, since allergic reaction right eye still feels heavy and sore    HPI Wound of right leg Resolved facial swelling and hives with oral prednisone . Reports residual right eye foreign object sensation. No change in vision, no drainage Improving wound with keflex  and doxycycline  UTD with TDAP.  Advised to Complete oral antibiotics and dressing change Use systane eye drop to help with eye irritation/dryness  Wt Readings from Last 3 Encounters:  04/30/24 259 lb 9.6 oz (117.8 kg)  04/23/24 260 lb 9.6 oz (118.2 kg)  04/16/24 258 lb (117 kg)    Reviewed medical, surgical, and social history today  Medications: Outpatient Medications Prior to Visit  Medication Sig   acetaminophen  (TYLENOL ) 325 MG tablet Take 650 mg by mouth every 6 (six) hours as needed for mild pain or moderate pain.   cephALEXin  (KEFLEX ) 500 MG capsule Take 1 capsule (500 mg total) by mouth 3 (three) times daily.   doxycycline  (VIBRA -TABS) 100 MG tablet Take 1 tablet (100 mg total) by mouth 2 (two) times daily.   phentermine  (ADIPEX-P ) 37.5 MG tablet Take 1 tablet (37.5 mg total) by mouth daily before breakfast.   Vitamin D , Ergocalciferol , (DRISDOL ) 1.25 MG (50000 UNIT) CAPS capsule Take 1 capsule (50,000 Units total) by mouth every 7 (seven) days.   predniSONE  (DELTASONE ) 20 MG tablet Take 2 tablets (40 mg total) by mouth daily with breakfast. (Patient not taking: Reported on 04/30/2024)   No facility-administered medications prior to visit.   Reviewed past medical and social history.   ROS per HPI above      Objective:  BP 110/80   Pulse 92   Temp 97.8 F (36.6 C) (Temporal)   Ht 5' 6 (1.676 m)   Wt 259 lb 9.6 oz (117.8 kg)   LMP 04/09/2024 (Exact Date)   SpO2 99%   BMI  41.90 kg/m      Physical Exam Vitals and nursing note reviewed.  Eyes:     General: Lids are normal.        Right eye: No foreign body, discharge or hordeolum.        Left eye: No foreign body, discharge or hordeolum.     Extraocular Movements: Extraocular movements intact.     Conjunctiva/sclera: Conjunctivae normal.  Skin:        Comments: Right shin wound: no erythema, no drainage, no induration     No results found for any visits on 04/30/24.    Assessment & Plan:    Problem List Items Addressed This Visit     Wound of right leg - Primary   Resolved facial swelling and hives with oral prednisone . Reports residual right eye foreign object sensation. No change in vision, no drainage Improving wound with keflex  and doxycycline  UTD with TDAP.  Advised to Complete oral antibiotics and dressing change Use systane eye drop to help with eye irritation/dryness      Return if symptoms worsen or fail to improve.     Roselie Mood, NP

## 2024-05-02 ENCOUNTER — Encounter: Payer: Self-pay | Admitting: Nurse Practitioner

## 2024-05-02 DIAGNOSIS — N898 Other specified noninflammatory disorders of vagina: Secondary | ICD-10-CM

## 2024-05-03 MED ORDER — FLUCONAZOLE 150 MG PO TABS: 150.0000 mg | ORAL_TABLET | Freq: Once | ORAL | 0 refills | Status: AC

## 2024-05-03 NOTE — Telephone Encounter (Signed)
 Tried calling patient and when calling it's like someone answers and I hear people talking in the background.

## 2024-05-10 ENCOUNTER — Encounter (HOSPITAL_COMMUNITY): Payer: Self-pay

## 2024-05-10 ENCOUNTER — Ambulatory Visit (HOSPITAL_COMMUNITY)
Admission: EM | Admit: 2024-05-10 | Discharge: 2024-05-10 | Disposition: A | Discharge status: Home / Self Care | Attending: Physician Assistant | Admitting: Physician Assistant

## 2024-05-10 DIAGNOSIS — R509 Fever, unspecified: Secondary | ICD-10-CM

## 2024-05-10 DIAGNOSIS — R051 Acute cough: Secondary | ICD-10-CM

## 2024-05-10 DIAGNOSIS — J101 Influenza due to other identified influenza virus with other respiratory manifestations: Secondary | ICD-10-CM | POA: Diagnosis not present

## 2024-05-10 LAB — POC COVID19/FLU A&B COMBO
Covid Antigen, POC: NEGATIVE
Influenza A Antigen, POC: POSITIVE — AB
Influenza B Antigen, POC: NEGATIVE

## 2024-05-10 MED ORDER — ACETAMINOPHEN 325 MG PO TABS
975.0000 mg | ORAL_TABLET | Freq: Once | ORAL | Status: AC
  Administered 2024-05-10: 975 mg via ORAL

## 2024-05-10 MED ORDER — OSELTAMIVIR PHOSPHATE 75 MG PO CAPS: 75.0000 mg | ORAL_CAPSULE | Freq: Two times a day (BID) | ORAL | 0 refills | Status: AC

## 2024-05-10 MED ORDER — PROMETHAZINE-DM 6.25-15 MG/5ML PO SYRP: 5.0000 mL | ORAL_SOLUTION | Freq: Three times a day (TID) | ORAL | 0 refills | Status: AC | PRN

## 2024-05-10 MED FILL — Acetaminophen Tab 325 MG: ORAL | Qty: 3 | Status: AC

## 2024-05-10 NOTE — ED Triage Notes (Signed)
 Pt states cough sore throat,congestion and runny nose for the past 3 days. States she took financial trader plus and thera flu at home.

## 2024-05-10 NOTE — Discharge Instructions (Signed)
 You tested positive for influenza.  Start Tamiflu twice daily for 5 days.  This medication can upset your stomach and cause nausea/vomiting as well as abnormal dreams and behaviors.  If you have any side effects stop the medication be seen immediately.  Use Promethazine  DM for cough.  This will make you sleepy so do not drive drink alcohol with taking it.  Use over-the-counter medication including Mucinex, Flonase , Tylenol , ibuprofen  for pain relief.  Make sure that you rest and drink plenty of fluid.  If your symptoms are not improving within a week please return for reevaluation.  If anything worsens and you have high fever not responding to medication, chest pain, shortness of breath, worsening cough, nausea/vomiting interfering with oral intake you need to be seen immediately.  You can return to work/activities once you have been fever free without medication for 24 hours.

## 2024-05-10 NOTE — ED Provider Notes (Signed)
 [facility]    CSN: [id number] Arrival date & time: 05/10/24  1548      History   Chief Complaint Chief Complaint  Patient presents with   Cough    HPI Stacy Mills is a 28 y.o. female.   Patient presents today with a several day history of URI symptoms.  She reports rhinorrhea, subjective fever, sore throat, congestion, cough.  She has had some nausea but denies any vomiting, diarrhea.  Denies any chest pain or shortness of breath.  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  She has had her influenza vaccine.  She had COVID several years ago but has not had it more recently.  She has tried Alka-Seltzer plus and TheraFlu with temporary improvement of symptoms.  She was recently treated for an infection about 1 to 2 months ago with doxycycline  and cephalexin  but has not had additional antibiotics in the past 90 days.  Denies any history of asthma, COPD, smoking.  She does have seasonal allergies but this is more severe than her typical allergy symptoms and she typically only gets allergies when the seasons change.  She is confident that she is not pregnant.    Past Medical History:  Diagnosis Date   Allergic reaction 04/23/2024   Allergy    Anxiety    Back pain    Burn 04/23/2024   Depression    Gallbladder problem    HSV (herpes simplex virus) anogenital infection    IIH (idiopathic intracranial hypertension)    IUD (intrauterine device) in place    Paraguard placed 2014, removed 10/2015   Migraine    Pelvic inflammation in female    Pelvic inflammatory disease 10/2012    Patient Active Problem List   Diagnosis Date Noted   Wound of right leg 04/23/2024   Dry mouth 03/12/2024   Metabolic dysfunction-associated steatotic liver disease (MASLD) 09/13/2023   Primary insomnia 03/12/2023   Obesity- Start BMI 44.43 01/12/2023   Abnormal food appetite 07/28/2022   Vitamin D  deficiency 06/23/2022   Insulin  resistance 06/23/2022    Family history of non-Hodgkin's lymphoma 09/09/2021   Lymphadenopathy 09/09/2021   Lower abdominal pain 08/31/2021   Acanthosis nigricans 01/26/2021   Liver abscess 05/30/2019   Genital herpes simplex 06/22/2018   Benign intracranial hypertension 05/19/2018   Migraines 12/22/2014   Hemorrhagic cyst of ovary 11/07/2012   Class 3 severe obesity with serious comorbidity and body mass index (BMI) of 40.0 to 44.9 in adult [facility]) 08/26/2012    Past Surgical History:  Procedure Laterality Date   CESAREAN SECTION N/A 08/19/2018   Procedure: CESAREAN SECTION;  Surgeon: Armond Cape, MD;  Location: [facility];  Service: Obstetrics;  Laterality: N/A;   CHOLECYSTECTOMY     spinal tap      OB History     Gravida  1   Para  1   Term  1   Preterm      AB  0   Living  1      SAB      IAB      Ectopic  0   Multiple  0   Live Births  1            Home Medications    Prior to Admission medications  Medication Sig Start Date End Date Taking? Authorizing Provider  oseltamivir (TAMIFLU) 75 MG capsule Take 1 capsule (75 mg total) by mouth every 12 (twelve) hours. 05/10/24  Yes Rickell Wiehe,  Darthy Manganelli K, PA-C  promethazine -dextromethorphan (PROMETHAZINE -DM) 6.25-15 MG/5ML syrup Take 5 mLs by mouth 3 (three) times daily as needed for cough. 05/10/24  Yes Rubbie Goostree K, PA-C  acetaminophen  (TYLENOL ) 325 MG tablet Take 650 mg by mouth every 6 (six) hours as needed for mild pain or moderate pain.    [provider]  phentermine  (ADIPEX-P ) 37.5 MG tablet Take 1 tablet (37.5 mg total) by mouth daily before breakfast. 04/16/24   Francyne Romano, MD  Vitamin D , Ergocalciferol , (DRISDOL ) 1.25 MG (50000 UNIT) CAPS capsule Take 1 capsule (50,000 Units total) by mouth every 7 (seven) days. 02/13/24   Francyne Romano, MD    Family History Family History  Problem Relation Age of Onset   Asthma Mother    High blood pressure Mother    Obesity Mother    Varicose Veins Mother     Hypertension Father    Alcoholism Father    Drug abuse Father    Alcohol abuse Father    Emphysema Maternal Grandfather    Depression Maternal Grandfather    Anxiety disorder Maternal Grandfather    Mental illness Maternal Grandfather    Diabetes Maternal Grandfather    COPD Maternal Grandfather    Cancer Paternal Grandmother    Non-Hodgkin's lymphoma Paternal Grandmother    Sudden death Paternal Grandmother    Early death Paternal Grandmother    Alcohol abuse Maternal Aunt    Hyperlipidemia Maternal Aunt    Anxiety disorder Sister    Anxiety disorder Son    Asthma Son     Social History Social History[1]   Allergies   Bactrim  [sulfamethoxazole -trimethoprim ] and Ivp dye [iodinated contrast media]   Review of Systems Review of Systems  Constitutional:  Positive for activity change, fatigue and fever. Negative for appetite change.  HENT:  Positive for congestion and sore throat. Negative for sinus pressure and sneezing.   Respiratory:  Positive for cough. Negative for shortness of breath.   Cardiovascular:  Negative for chest pain.  Gastrointestinal:  Positive for nausea. Negative for abdominal pain, diarrhea and vomiting.  Musculoskeletal:  Positive for arthralgias and myalgias.  Neurological:  Positive for headaches. Negative for dizziness and light-headedness.     Physical Exam Triage Vital Signs ED Triage Vitals  Encounter Vitals Group     BP 05/10/24 1642 133/82     Girls Systolic BP Percentile --      Girls Diastolic BP Percentile --      Boys Systolic BP Percentile --      Boys Diastolic BP Percentile --      Pulse Rate 05/10/24 1642 (!) 114     Resp 05/10/24 1642 16     Temp 05/10/24 1642 98.7 F (37.1 C)     Temp Source 05/10/24 1642 Oral     SpO2 05/10/24 1642 98 %     Weight --      Height --      Head Circumference --      Peak Flow --      Pain Score 05/10/24 1641 0     Pain Loc --      Pain Education --      Exclude from Growth Chart --     No data found.  Updated Vital Signs BP 134/89 (BP Location: Right Arm)   Pulse (!) 104   Temp 98.7 F (37.1 C) (Oral)   Resp 16   LMP 05/03/2024 (Approximate)   SpO2 98%   Visual Acuity Right Eye Distance:  Left Eye Distance:   Bilateral Distance:    Right Eye Near:   Left Eye Near:    Bilateral Near:     Physical Exam Vitals reviewed.  Constitutional:      General: She is awake. She is not in acute distress.    Appearance: Normal appearance. She is well-developed. She is not ill-appearing.     Comments: Very pleasant female appears stated age in no acute distress sitting comfortable in exam room  HENT:     Head: Normocephalic and atraumatic.     Right Ear: Tympanic membrane, ear canal and external ear normal. Tympanic membrane is not erythematous or bulging.     Left Ear: Tympanic membrane, ear canal and external ear normal. Tympanic membrane is not erythematous or bulging.     Nose:     Right Sinus: No maxillary sinus tenderness or frontal sinus tenderness.     Left Sinus: No maxillary sinus tenderness or frontal sinus tenderness.     Mouth/Throat:     Pharynx: Uvula midline. Postnasal drip present. No oropharyngeal exudate or posterior oropharyngeal erythema.  Cardiovascular:     Rate and Rhythm: Regular rhythm. Tachycardia present.     Heart sounds: Normal heart sounds, S1 normal and S2 normal. No murmur heard. Pulmonary:     Effort: Pulmonary effort is normal.     Breath sounds: Normal breath sounds. No wheezing, rhonchi or rales.     Comments: Clear to auscultation bilaterally Psychiatric:        Behavior: Behavior is cooperative.      UC Treatments / Results  Labs (all labs ordered are listed, but only abnormal results are displayed) Labs Reviewed  POC COVID19/FLU A&B COMBO - Abnormal; Notable for the following components:      Result Value   Influenza A Antigen, POC Positive (*)    All other components within normal limits     EKG   Radiology No results found.  Procedures Procedures (including critical care time)  Medications Ordered in UC Medications  acetaminophen  (TYLENOL ) tablet 975 mg (975 mg Oral Given 05/10/24 1656)    Initial Impression / Assessment and Plan / UC Course  I have reviewed the triage vital signs and the nursing notes.  Pertinent labs & imaging results that were available during my care of the patient were reviewed by me and considered in my medical decision making (see chart for details).     Patient is well-appearing, afebrile, nontoxic.  She was initially mildly tachycardic but this improved after dose of antipyretics as I believe she was trying to mount a fever.  She tested positive for influenza A.  Will start Tamiflu twice daily for 5 days and we discussed that this medication can cause side effects including GI upset and abnormal dreams/behaviors and if she has any side effect she should stop the medication and be seen immediately.  No indication for dose adjustment based on metabolic panel from 03/27/2024 with a creatinine of 0.9 and calculated creatinine clearance of 171 mL/min.  She was given Promethazine  DM for cough.  We discussed that this can be sedating and she is not to drive drink alcohol with taking it.  Recommend she use over-the-counter medication for additional symptom relief.  If she is not feeling better within 3 to 5 days she is to return for reevaluation.  We discussed that if anything worsens she needs to be seen immediately.  Strict return precautions given.  Excuse note provided.  Final Clinical Impressions(s) / UC  Diagnoses   Final diagnoses:  Fever, unspecified  Influenza A  Acute cough     Discharge Instructions      You tested positive for influenza.  Start Tamiflu twice daily for 5 days.  This medication can upset your stomach and cause nausea/vomiting as well as abnormal dreams and behaviors.  If you have any side effects stop the medication be  seen immediately.  Use Promethazine  DM for cough.  This will make you sleepy so do not drive drink alcohol with taking it.  Use over-the-counter medication including Mucinex, Flonase , Tylenol , ibuprofen  for pain relief.  Make sure that you rest and drink plenty of fluid.  If your symptoms are not improving within a week please return for reevaluation.  If anything worsens and you have high fever not responding to medication, chest pain, shortness of breath, worsening cough, nausea/vomiting interfering with oral intake you need to be seen immediately.  You can return to work/activities once you have been fever free without medication for 24 hours.      ED Prescriptions     Medication Sig Dispense Auth. Provider   oseltamivir (TAMIFLU) 75 MG capsule Take 1 capsule (75 mg total) by mouth every 12 (twelve) hours. 10 capsule Dorreen Valiente K, PA-C   promethazine -dextromethorphan (PROMETHAZINE -DM) 6.25-15 MG/5ML syrup Take 5 mLs by mouth 3 (three) times daily as needed for cough. 118 mL Sherrod Toothman K, PA-C      PDMP not reviewed this encounter.     [1]  Social History Tobacco Use   Smoking status: Former   Smokeless tobacco: Never  Vaping Use   Vaping status: Never Used  Substance Use Topics   Alcohol use: Yes    Alcohol/week: 2.0 standard drinks of alcohol    Types: 2 Shots of liquor per week    Comment: social   Drug use: Not Currently    Types: Marijuana     Sherrell Rocky POUR, PA-C 05/10/24 1739

## 2024-05-11 ENCOUNTER — Ambulatory Visit

## 2024-05-28 ENCOUNTER — Encounter (INDEPENDENT_AMBULATORY_CARE_PROVIDER_SITE_OTHER): Payer: Self-pay | Admitting: Internal Medicine

## 2024-05-28 ENCOUNTER — Ambulatory Visit (INDEPENDENT_AMBULATORY_CARE_PROVIDER_SITE_OTHER): Admitting: Internal Medicine

## 2024-05-28 VITALS — BP 127/85 | HR 88 | Temp 98.3°F | Ht 65.0 in | Wt 253.0 lb

## 2024-05-28 DIAGNOSIS — Z6841 Body Mass Index (BMI) 40.0 and over, adult: Secondary | ICD-10-CM

## 2024-05-28 DIAGNOSIS — R638 Other symptoms and signs concerning food and fluid intake: Secondary | ICD-10-CM | POA: Diagnosis not present

## 2024-05-28 DIAGNOSIS — E66813 Obesity, class 3: Secondary | ICD-10-CM

## 2024-05-28 DIAGNOSIS — K760 Fatty (change of) liver, not elsewhere classified: Secondary | ICD-10-CM

## 2024-05-28 DIAGNOSIS — E88819 Insulin resistance, unspecified: Secondary | ICD-10-CM | POA: Diagnosis not present

## 2024-05-28 MED ORDER — PHENTERMINE HCL 37.5 MG PO TABS: 37.5000 mg | ORAL_TABLET | Freq: Every day | ORAL | 0 refills | Status: AC

## 2024-05-28 NOTE — Assessment & Plan Note (Signed)
 Weight: decrease of 38.008 lb (13.1%) over 6 months, 2 weeks  Start: 11/07/2023 291 lb 0.1 oz (132 kg)  End: 05/28/2024 253 lb (114.8 kg)

## 2024-05-28 NOTE — Progress Notes (Signed)
 "  Office: [phone]  /  Fax: [phone]  Weight Summary and Body Composition Analysis (BIA)  Vitals Temp: 98.3 F (36.8 C) BP: 127/85 Pulse Rate: 88 SpO2: 100 %   Anthropometric Measurements Height: 5' 5 (1.651 m) Weight: 253 lb (114.8 kg) BMI (Calculated): 42.1 Weight at Last Visit: 258 lb Weight Lost Since Last Visit: 5 lb Weight Gained Since Last Visit: 0 Starting Weight: 276 lb Total Weight Loss (lbs): 23 lb (10.4 kg) Peak Weight: 283 lb   Body Composition  Body Fat %: 49.6 % Fat Mass (lbs): 125.8 lbs Muscle Mass (lbs): 121.6 lbs Total Body Water (lbs): 101.8 lbs Visceral Fat Rating : 13    RMR: [id number]  Today's Visit #: 25  Starting Date: 04/05/22   Subjective   Chief Complaint: Obesity  Interval History  Discussed the use of AI scribe software for clinical note transcription with the patient, who gave verbal consent to proceed.  History of Present Illness Stacy Mills is a 28 year old female who presents for medical weight management.  She has been on a medical weight management program and has lost five pounds since her last visit. She follows a 1500 calorie nutrition plan approximately 80% of the time and engages in regular physical activity, primarily cardio and walking. She has been on phentermine  37.5 mg since June, resulting in a total weight loss of 38 pounds, which is about 13% of her total body weight.  Her dietary habits include using protein shakes as a meal replacement for breakfast, occasionally having cereal, and consuming meals that typically consist of a meat and a vegetable. She eats more vegetables than before, including Brussels sprouts, broccoli, asparagus, and leafy greens like spinach and arugula. She has reduced her frequency of eating out.  She engages in physical activity through walking, both as part of her job and while doing [employer], which involves going up and down steps. This side hustle has been beneficial  during the holiday season.  She has experienced side effects from phentermine , such as feeling hot, but no other significant issues. She has cut out sodas and energy drinks, opting for unsweetened tea instead. She reports that her blood pressure and heart rate were only high when she had the flu; otherwise, she has not noticed any issues.     Challenges affecting patient progress: work schedule and low volume of physical activity at present .    Pharmacotherapy for weight management: She is currently taking Phentermine  (longterm use, off-label, single agent)  with adequate clinical response  and without side effects..   Assessment and Plan   Treatment Plan For Obesity:  Recommended Dietary Goals  Dasani is currently in the action stage of change. As such, her goal is to continue weight management plan. She has agreed to: incorporate 1-2 meal replacements a day for convenience  and continue current plan  Behavioral Health and Counseling  We discussed the following behavioral modification strategies today: continue to work on maintaining a reduced calorie state, getting the recommended amount of protein, incorporating whole foods, making healthy choices, staying well hydrated and practicing mindfulness when eating. and increase protein intake, fibrous foods (25 grams per day for women, 30 grams for men) and water to improve satiety and decrease hunger signals. .  Additional education and resources provided today: None  Recommended Physical Activity Goals  Miyako has been advised to work up to 150 minutes of moderate intensity aerobic activity a week and strengthening exercises 2-3 times per week for  cardiovascular health, weight loss maintenance and preservation of muscle mass.  She has agreed to :  Think about enjoyable ways to increase daily physical activity and overcoming barriers to exercise and Increase physical activity in their day and reduce sedentary time (increase  NEAT).  Medical Interventions and Pharmacotherapy  We discussed various medication options to help Tiffony with her weight loss efforts and we both agreed to : Adequate clinical response to anti-obesity medication, continue current regimen and Patient was counseled on the importance of maintaining healthy lifestyle habits, including balanced nutrition, regular physical activity, and behavioral modifications, while taking antiobesity medication.  Patient verbalized understanding that medication is an adjunct to, not a replacement for, lifestyle changes and that the long-term success and weight maintenance depend on continued adherence to these strategies.  We have discussed that long-term use of phentermine  for weight management. Patient informed that use beyond 12 weeks is off-label. Reviewed evidence supporting extended use in select patients with regular monitoring. Risks discussed: insomnia, increased heart rate, elevated BP, and potential for dependence. Alternatives include lifestyle interventions, FDA-approved medications (e.g., Wegovy , Zepbound , Qsymia, Contrave)- these are either not covered, cost prohibitive or contraindicated.  Medical history reviewed for contraindications.  Patient verbalized understanding and agreed to continue phentermine  with close monitoring. Plan includes regular follow-up every 4-6 weeks to assess efficacy, side effects, and vitals.  Medication safety: Reviewed common side effects of phentermine , no side effects reported.  Reviewed vitals signs and they are stable Reviewed for contraindications, none present Reviewed state registry for controlled substances and no other controlled substances found Medication will be discontinued if less than 5% weight loss in 6 months Discussed safety data and off label use for long-term treatment of obesity.  Associated Conditions Impacted by Obesity Treatment  Assessment & Plan Metabolic dysfunction-associated steatotic liver  disease (MASLD) Class 3 severe obesity with serious comorbidity and body mass index (BMI) of 40.0 to 44.9 in adult, unspecified obesity type (HCC) Insulin  resistance Abnormal food appetite Weight: decrease of 38.008 lb (13.1%) over 6 months, 2 weeks  Start: 11/07/2023 291 lb 0.1 oz (132 kg)  End: 05/28/2024 253 lb (114.8 kg)   Significant weight loss of approximately 38 pounds (13% of total body weight) since June, attributed to a combination of phentermine , dietary changes, and increased physical activity. Reports using a meal replacement for breakfast and consuming more vegetables. Engages in regular walking and has a side job involving physical activity. No significant side effects from phentermine , except occasional feeling of being hot. Blood pressure and heart rate are well-controlled. Demonstrates good adherence to the nutrition plan and physical activity regimen. - Continue phentermine  37.5 mg daily. - Maintain 1500 calorie nutrition plan. - Encouraged consistent healthy eating habits, including increased intake of fruits, vegetables, and lean proteins. - Advised to avoid energy drinks and limit caffeine intake to decaf coffee. - Encouraged continued physical activity, including walking and side job activities. - Scheduled follow-up appointment in one month.         Objective   Physical Exam:  Blood pressure 127/85, pulse 88, temperature 98.3 F (36.8 C), height 5' 5 (1.651 m), weight 253 lb (114.8 kg), last menstrual period 05/03/2024, SpO2 100%. Body mass index is 42.1 kg/m.  General: She is overweight, cooperative, alert, well developed, and in no acute distress. PSYCH: Has normal mood, affect and thought process.   HEENT: EOMI, sclerae are anicteric. Lungs: Normal breathing effort, no conversational dyspnea. Extremities: No edema.  Neurologic: No gross sensory or motor deficits. No tremors  or fasciculations noted.    Diagnostic Data Reviewed:  BMET    Component  Value Date/Time   NA 139 03/27/2024 1132   NA 141 09/13/2023 1532   K 3.7 03/27/2024 1132   CL 107 03/27/2024 1132   CO2 25 03/27/2024 1132   GLUCOSE 84 03/27/2024 1132   BUN 7 03/27/2024 1132   BUN 9 09/13/2023 1532   CREATININE 0.90 03/27/2024 1132   CREATININE 0.98 09/23/2021 1440   CREATININE 0.93 11/05/2012 1015   CALCIUM 8.8 03/27/2024 1132   GFRNONAA >60 09/23/2021 1440   GFRAA >60 05/31/2019 0417   Lab Results  Component Value Date   HGBA1C 5.4 09/13/2023   HGBA1C 5.4 05/09/2016   Lab Results  Component Value Date   INSULIN  18.1 09/13/2023   INSULIN  15.5 04/05/2022   Lab Results  Component Value Date   TSH 1.250 04/05/2022   CBC    Component Value Date/Time   WBC 9.7 09/13/2023 1532   WBC 7.2 09/23/2021 1440   WBC 8.1 09/16/2021 0847   RBC 4.58 09/13/2023 1532   RBC 4.01 09/23/2021 1440   HGB 13.6 09/13/2023 1532   HCT 40.5 09/13/2023 1532   PLT 319 09/13/2023 1532   MCV 88 09/13/2023 1532   MCH 29.7 09/13/2023 1532   MCH 29.9 09/23/2021 1440   MCHC 33.6 09/13/2023 1532   MCHC 33.1 09/23/2021 1440   RDW 11.8 09/13/2023 1532   Iron Studies No results found for: IRON, TIBC, FERRITIN, IRONPCTSAT Lipid Panel     Component Value Date/Time   CHOL 175 03/27/2024 1132   CHOL 180 09/13/2023 1532   TRIG 47.0 03/27/2024 1132   HDL 66.00 03/27/2024 1132   HDL 69 09/13/2023 1532   CHOLHDL 3 03/27/2024 1132   VLDL 9.4 03/27/2024 1132   LDLCALC 99 03/27/2024 1132   LDLCALC 101 (H) 09/13/2023 1532   Hepatic Function Panel     Component Value Date/Time   PROT 7.2 03/27/2024 1132   PROT 7.0 09/13/2023 1532   ALBUMIN 4.0 03/27/2024 1132   ALBUMIN 4.2 09/13/2023 1532   AST 14 03/27/2024 1132   AST 15 09/23/2021 1440   ALT 14 03/27/2024 1132   ALT 12 09/23/2021 1440   ALKPHOS 56 03/27/2024 1132   BILITOT 0.5 03/27/2024 1132   BILITOT 0.4 09/13/2023 1532   BILITOT 0.4 09/23/2021 1440      Component Value Date/Time   TSH 1.250 04/05/2022 0839    Nutritional Lab Results  Component Value Date   VD25OH 21.81 (L) 03/27/2024   VD25OH 20.1 (L) 09/13/2023   VD25OH 19.8 (L) 03/27/2023    Medications: Outpatient Encounter Medications as of 05/28/2024  Medication Sig   acetaminophen  (TYLENOL ) 325 MG tablet Take 650 mg by mouth every 6 (six) hours as needed for mild pain or moderate pain.   oseltamivir  (TAMIFLU ) 75 MG capsule Take 1 capsule (75 mg total) by mouth every 12 (twelve) hours.   phentermine  (ADIPEX-P ) 37.5 MG tablet Take 1 tablet (37.5 mg total) by mouth daily before breakfast.   promethazine -dextromethorphan (PROMETHAZINE -DM) 6.25-15 MG/5ML syrup Take 5 mLs by mouth 3 (three) times daily as needed for cough.   Vitamin D , Ergocalciferol , (DRISDOL ) 1.25 MG (50000 UNIT) CAPS capsule Take 1 capsule (50,000 Units total) by mouth every 7 (seven) days.   No facility-administered encounter medications on file as of 05/28/2024.     Follow-Up   No follow-ups on file.SABRA She was informed of the importance of frequent follow up visits to maximize her  success with intensive lifestyle modifications for her multiple health conditions.  Attestation Statement   Reviewed by clinician on day of visit: allergies, medications, problem list, medical history, surgical history, family history, social history, and previous encounter notes.     Lucas Parker, MD  "

## 2024-06-26 ENCOUNTER — Ambulatory Visit (INDEPENDENT_AMBULATORY_CARE_PROVIDER_SITE_OTHER): Admitting: Internal Medicine

## 2024-07-04 ENCOUNTER — Encounter (INDEPENDENT_AMBULATORY_CARE_PROVIDER_SITE_OTHER): Payer: Self-pay | Admitting: Internal Medicine

## 2024-07-04 ENCOUNTER — Ambulatory Visit (INDEPENDENT_AMBULATORY_CARE_PROVIDER_SITE_OTHER): Admitting: Internal Medicine

## 2024-07-04 ENCOUNTER — Telehealth (INDEPENDENT_AMBULATORY_CARE_PROVIDER_SITE_OTHER): Payer: Self-pay | Admitting: *Deleted

## 2024-07-04 VITALS — BP 123/81 | HR 87 | Temp 97.9°F | Ht 65.0 in | Wt 256.0 lb

## 2024-07-04 DIAGNOSIS — K760 Fatty (change of) liver, not elsewhere classified: Secondary | ICD-10-CM

## 2024-07-04 DIAGNOSIS — E66813 Obesity, class 3: Secondary | ICD-10-CM | POA: Diagnosis not present

## 2024-07-04 DIAGNOSIS — E88819 Insulin resistance, unspecified: Secondary | ICD-10-CM | POA: Diagnosis not present

## 2024-07-04 DIAGNOSIS — Z6841 Body Mass Index (BMI) 40.0 and over, adult: Secondary | ICD-10-CM | POA: Diagnosis not present

## 2024-07-04 MED ORDER — WEGOVY 0.25 MG/0.5ML ~~LOC~~ SOAJ: 0.2500 mg | SUBCUTANEOUS | 0 refills | Status: AC

## 2024-07-04 NOTE — Assessment & Plan Note (Signed)
" °  BMI of 40.0 to 44.9. Recent weight gain of 3 pounds. Current regimen includes phentermine , which she did not take for a week due to travel. Discussed the importance of lifestyle changes, including diet and physical activity, in conjunction with medication for effective weight management. Introduced Wegovy  (GLP-1 receptor agonist) as a new treatment option, emphasizing its role in regulating hunger and promoting fullness. Discussed potential side effects of Wegovy , including gastrointestinal upset with fatty, spicy, or sugary foods, and the importance of adequate protein and fiber intake to prevent muscle loss and constipation. Highlighted the need for long-term commitment to medication and lifestyle changes to prevent weight regain. - Discontinued phentermine . - Initiated Wegovy  at 0.25 mg weekly. - Educated on proper injection technique for Wegovy . - Emphasized dietary modifications: reduce alcohol, artificial sweeteners, and carbohydrates; increase fruits, vegetables, and lean proteins. - Encouraged regular physical activity. - Scheduled follow-up in 4 weeks. "

## 2024-07-04 NOTE — Progress Notes (Signed)
 "  Office: [phone]  /  Fax: [phone]  Weight Summary and Body Composition Analysis (BIA)  Vitals Temp: 97.9 F (36.6 C) BP: 123/81 Pulse Rate: 87 SpO2: 97 %   Anthropometric Measurements Height: 5' 5 (1.651 m) Weight: 256 lb (116.1 kg) BMI (Calculated): 42.6 Weight at Last Visit: 253 lb Weight Lost Since Last Visit: 0 lb Weight Gained Since Last Visit: 3 lb Starting Weight: 276 lb Total Weight Loss (lbs): 20 lb (9.072 kg) Peak Weight: 283 lb   Body Composition  Body Fat %: 50.7 % Fat Mass (lbs): 130 lbs Muscle Mass (lbs): 12 lbs Visceral Fat Rating : 13    RMR: [id number]  Today's Visit #: 26  Starting Date: 06/15/21   Subjective   Chief Complaint: Obesity  Interval History  Discussed the use of AI scribe software for clinical note transcription with the patient, who gave verbal consent to proceed.  History of Present Illness Stacy Mills is a 29 year old female with insulin  resistance and fatty liver who presents for medical weight management.  She has experienced a recent weight gain of three pounds, attributed to a recent trip. She missed about a week of phentermine  due to not picking it up before her trip. No significant changes in appetite were noted during this time.  Her current physical activity includes exercising four days a week for about thirty minutes each session. Her dietary goal is to consume around 1200 calories daily. She wants to focus on losing abdominal fat and discusses dietary habits, including the consumption of lime sweet tea with Splenda, orange juice, cranberry juice, and water. She acknowledges that her water intake could be improved.  She has not previously tried GOP1 injections. She had been taking phentermine  for weight management but missed about a week due to not picking it up before her trip.     Challenges affecting patient progress: strong hunger signals and/or impaired satiety / inhibitory control and recent lapse  in weight management plan due to work, travel, illness or family circumstances.    Pharmacotherapy for weight management: She is currently taking Phentermine  (longterm use, off-label, single agent)  with adequate clinical response  and without side effects..   Assessment and Plan   Treatment Plan For Obesity:  Recommended Dietary Goals  Halena is currently in the action stage of change. As such, her goal is to continue weight management plan. She has agreed to: incorporate prepackaged healthy meals for convenience, incorporate 1-2 meal replacements a day for convenience , and continue current reduced-calorie meal plan  Behavioral Health and Counseling  We discussed the following behavioral modification strategies today: continue to work on implementation of reduced calorie nutritional plan and getting back on track after recent lapse.  Additional education and resources provided today: None  Recommended Physical Activity Goals  Louetta has been advised to work up to 150 minutes of moderate intensity aerobic activity a week and strengthening exercises 2-3 times per week for cardiovascular health, weight loss maintenance and preservation of muscle mass.  She has agreed to :  Think about enjoyable ways to increase daily physical activity and overcoming barriers to exercise, Increase physical activity in their day and reduce sedentary time (increase NEAT)., Increase volume of physical activity to a goal of 240 minutes a week, and Combine aerobic and strengthening exercises for efficiency and improved cardiometabolic health.  Medical Interventions and Pharmacotherapy  We discussed various medication options to help Qianna with her weight loss efforts and we both agreed to starting  anti-obesity pharmacotherapy with an GLP-1 agonist.  In addition to a prescribed reduced calorie nutrition plan (RCNP), behavioral strategies and physical activity, Anberlyn would benefit from pharmacotherapy to  assist with abnormal hunger signals, impaired satiety and cravings. This will reduce obesity-related health risks by inducing weight loss, and help reduce food consumption and adherence to [facility]). It may also improve QOL by improving self-confidence and reduce the setbacks associated with metabolic adaptations.  she also has high risk comorbidities associated with her obesity including: Hypertension, MASLD, and Insulin  Resistance. All of these conditions increase her risk for future complications and are either improved or potentially reverse through sustained weight loss.  She has been on metformin  and topiramate  in the past but medication was discontinued due to side effects She has been on phentermine  since April 2025 and had lost approximately 38 pounds over 7 months the medication is losing its effectiveness  GLP-1 receptor agonists have been shown in robust clinical trials to: Enhance satiety and delay gastric emptying, resulting in reduced caloric intake Improve insulin  sensitivity and glycemic control Promote clinically meaningful weight loss (>=5-22%) Reduce cardiovascular risk markers, including blood pressure, lipids, and inflammation   Given Taneah's clinical profile--GLP-1 therapy is both indicated and expected to provide multifactorial benefit. The medication's mechanism aligns precisely with the patient's needs and addresses the physiologic underpinnings of weight gain.   After a detailed discussion covering treatment rationale, mechanism of action, common side effects, expected outcomes, risks, and long-term use considerations, shared decision-making was used to initiate Wegovy  0.25 mg once a week. The importance of ongoing lifestyle management and long-term adherence was emphasized, as was the potential for weight regain following discontinuation.  The delivery device was demonstrated, and using the teach-back method, Claudetta successfully demonstrated proper injection technique.  Ongoing monitoring and follow-up are planned to assess tolerability, clinical response, and reinforce behavioral strategies.   Associated Conditions Impacted by Obesity Treatment  Assessment & Plan Metabolic dysfunction-associated steatotic liver disease (MASLD)  She has mild hepatic steatosis on CT scan from 2023 with normal liver enzymes.  Fibrosis 4 Score = .43  Patient will continue to work on reducing processed foods, saturated fats, simple and added sugars in her diet.  Continue with current weight management strategy Has lost 13% of body weight Discussed GLP-1 receptor agonists as a therapeutic option for MASLD, with benefits including clinically significant weight loss, improved insulin  resistance, and improvement in hepatic steatosis and metabolic parameters.    Class 3 severe obesity with serious comorbidity and body mass index (BMI) of 40.0 to 44.9 in adult, unspecified obesity type (HCC)  BMI of 40.0 to 44.9. Recent weight gain of 3 pounds. Current regimen includes phentermine , which she did not take for a week due to travel. Discussed the importance of lifestyle changes, including diet and physical activity, in conjunction with medication for effective weight management. Introduced Wegovy  (GLP-1 receptor agonist) as a new treatment option, emphasizing its role in regulating hunger and promoting fullness. Discussed potential side effects of Wegovy , including gastrointestinal upset with fatty, spicy, or sugary foods, and the importance of adequate protein and fiber intake to prevent muscle loss and constipation. Highlighted the need for long-term commitment to medication and lifestyle changes to prevent weight regain. - Discontinued phentermine . - Initiated Wegovy  at 0.25 mg weekly. - Educated on proper injection technique for Wegovy . - Emphasized dietary modifications: reduce alcohol, artificial sweeteners, and carbohydrates; increase fruits, vegetables, and lean proteins. -  Encouraged regular physical activity. - Scheduled follow-up in 4 weeks. Insulin  resistance  Lab Results  Component Value Date   HGBA1C 5.4 09/13/2023   HGBA1C 5.2 09/13/2022   HGBA1C 5.2 04/05/2022   Stable at this time. Glycemic control is being addressed through the weight management plan above, with expected improvement in insulin  resistance and metabolic parameters as weight loss progresses. Will continue to monitor A1c and glucose trends.           Objective   Physical Exam:  Blood pressure 123/81, pulse 87, temperature 97.9 F (36.6 C), height 5' 5 (1.651 m), weight 256 lb (116.1 kg), last menstrual period 06/30/2024, SpO2 97%. Body mass index is 42.6 kg/m.  General: She is overweight, cooperative, alert, well developed, and in no acute distress. PSYCH: Has normal mood, affect and thought process.   HEENT: EOMI, sclerae are anicteric. Lungs: Normal breathing effort, no conversational dyspnea. Extremities: No edema.  Neurologic: No gross sensory or motor deficits. No tremors or fasciculations noted.    Diagnostic Data Reviewed:  BMET    Component Value Date/Time   NA 139 03/27/2024 1132   NA 141 09/13/2023 1532   K 3.7 03/27/2024 1132   CL 107 03/27/2024 1132   CO2 25 03/27/2024 1132   GLUCOSE 84 03/27/2024 1132   BUN 7 03/27/2024 1132   BUN 9 09/13/2023 1532   CREATININE 0.90 03/27/2024 1132   CREATININE 0.98 09/23/2021 1440   CREATININE 0.93 11/05/2012 1015   CALCIUM 8.8 03/27/2024 1132   GFRNONAA >60 09/23/2021 1440   GFRAA >60 05/31/2019 0417   Lab Results  Component Value Date   HGBA1C 5.4 09/13/2023   HGBA1C 5.4 05/09/2016   Lab Results  Component Value Date   INSULIN  18.1 09/13/2023   INSULIN  15.5 04/05/2022   Lab Results  Component Value Date   TSH 1.250 04/05/2022   CBC    Component Value Date/Time   WBC 9.7 09/13/2023 1532   WBC 7.2 09/23/2021 1440   WBC 8.1 09/16/2021 0847   RBC 4.58 09/13/2023 1532   RBC 4.01 09/23/2021 1440    HGB 13.6 09/13/2023 1532   HCT 40.5 09/13/2023 1532   PLT 319 09/13/2023 1532   MCV 88 09/13/2023 1532   MCH 29.7 09/13/2023 1532   MCH 29.9 09/23/2021 1440   MCHC 33.6 09/13/2023 1532   MCHC 33.1 09/23/2021 1440   RDW 11.8 09/13/2023 1532   Iron Studies No results found for: IRON, TIBC, FERRITIN, IRONPCTSAT Lipid Panel     Component Value Date/Time   CHOL 175 03/27/2024 1132   CHOL 180 09/13/2023 1532   TRIG 47.0 03/27/2024 1132   HDL 66.00 03/27/2024 1132   HDL 69 09/13/2023 1532   CHOLHDL 3 03/27/2024 1132   VLDL 9.4 03/27/2024 1132   LDLCALC 99 03/27/2024 1132   LDLCALC 101 (H) 09/13/2023 1532   Hepatic Function Panel     Component Value Date/Time   PROT 7.2 03/27/2024 1132   PROT 7.0 09/13/2023 1532   ALBUMIN 4.0 03/27/2024 1132   ALBUMIN 4.2 09/13/2023 1532   AST 14 03/27/2024 1132   AST 15 09/23/2021 1440   ALT 14 03/27/2024 1132   ALT 12 09/23/2021 1440   ALKPHOS 56 03/27/2024 1132   BILITOT 0.5 03/27/2024 1132   BILITOT 0.4 09/13/2023 1532   BILITOT 0.4 09/23/2021 1440      Component Value Date/Time   TSH 1.250 04/05/2022 0839   Nutritional Lab Results  Component Value Date   VD25OH 21.81 (L) 03/27/2024   VD25OH 20.1 (L) 09/13/2023   VD25OH 19.8 (L)  03/27/2023    Medications: Outpatient Encounter Medications as of 07/04/2024  Medication Sig   acetaminophen  (TYLENOL ) 325 MG tablet Take 650 mg by mouth every 6 (six) hours as needed for mild pain or moderate pain.   oseltamivir  (TAMIFLU ) 75 MG capsule Take 1 capsule (75 mg total) by mouth every 12 (twelve) hours.   phentermine  (ADIPEX-P ) 37.5 MG tablet Take 1 tablet (37.5 mg total) by mouth daily before breakfast.   promethazine -dextromethorphan (PROMETHAZINE -DM) 6.25-15 MG/5ML syrup Take 5 mLs by mouth 3 (three) times daily as needed for cough.   semaglutide -weight management (WEGOVY ) 0.25 MG/0.5ML SOAJ SQ injection Inject 0.25 mg into the skin once a week.   Vitamin D , Ergocalciferol ,  (DRISDOL ) 1.25 MG (50000 UNIT) CAPS capsule Take 1 capsule (50,000 Units total) by mouth every 7 (seven) days.   No facility-administered encounter medications on file as of 07/04/2024.     Follow-Up   Return in about 4 weeks (around 08/01/2024) for For Weight Mangement with Dr. Francyne.SABRA She was informed of the importance of frequent follow up visits to maximize her success with intensive lifestyle modifications for her multiple health conditions.  Attestation Statement   Reviewed by clinician on day of visit: allergies, medications, problem list, medical history, surgical history, family history, social history, and previous encounter notes.     Lucas Francyne, MD  "

## 2024-07-04 NOTE — Assessment & Plan Note (Signed)
 Lab Results  Component Value Date   HGBA1C 5.4 09/13/2023   HGBA1C 5.2 09/13/2022   HGBA1C 5.2 04/05/2022   Stable at this time. Glycemic control is being addressed through the weight management plan above, with expected improvement in insulin  resistance and metabolic parameters as weight loss progresses. Will continue to monitor A1c and glucose trends.

## 2024-07-04 NOTE — Telephone Encounter (Signed)
 PA SUBMITTED VIA [email or web address]  EOC [phone]

## 2024-07-04 NOTE — Assessment & Plan Note (Signed)
" °  She has mild hepatic steatosis on CT scan from 2023 with normal liver enzymes.  Fibrosis 4 Score = .43  Patient will continue to work on reducing processed foods, saturated fats, simple and added sugars in her diet.  Continue with current weight management strategy Has lost 13% of body weight Discussed GLP-1 receptor agonists as a therapeutic option for MASLD, with benefits including clinically significant weight loss, improved insulin  resistance, and improvement in hepatic steatosis and metabolic parameters.    "

## 2024-07-31 ENCOUNTER — Ambulatory Visit (INDEPENDENT_AMBULATORY_CARE_PROVIDER_SITE_OTHER): Admitting: Internal Medicine
